# Patient Record
Sex: Female | Born: 1975
Health system: Southern US, Community
[De-identification: ages and names within clinical notes are randomized; demographics above are authoritative.]

## PROBLEM LIST (undated history)

## (undated) DIAGNOSIS — M1990 Unspecified osteoarthritis, unspecified site: Secondary | ICD-10-CM

## (undated) DIAGNOSIS — T884XXA Failed or difficult intubation, initial encounter: Secondary | ICD-10-CM

## (undated) DIAGNOSIS — I10 Essential (primary) hypertension: Secondary | ICD-10-CM

## (undated) DIAGNOSIS — M797 Fibromyalgia: Secondary | ICD-10-CM

## (undated) DIAGNOSIS — J189 Pneumonia, unspecified organism: Secondary | ICD-10-CM

## (undated) DIAGNOSIS — F419 Anxiety disorder, unspecified: Secondary | ICD-10-CM

## (undated) DIAGNOSIS — L409 Psoriasis, unspecified: Secondary | ICD-10-CM

## (undated) DIAGNOSIS — K219 Gastro-esophageal reflux disease without esophagitis: Secondary | ICD-10-CM

## (undated) DIAGNOSIS — F32A Depression, unspecified: Secondary | ICD-10-CM

## (undated) DIAGNOSIS — A6920 Lyme disease, unspecified: Secondary | ICD-10-CM

## (undated) DIAGNOSIS — D649 Anemia, unspecified: Secondary | ICD-10-CM

## (undated) DIAGNOSIS — Z87442 Personal history of urinary calculi: Secondary | ICD-10-CM

## (undated) HISTORY — DX: Psoriasis, unspecified: L40.9

## (undated) HISTORY — DX: Gastro-esophageal reflux disease without esophagitis: K21.9

## (undated) HISTORY — PX: APPENDECTOMY: SHX54

## (undated) HISTORY — DX: Fibromyalgia: M79.7

## (undated) HISTORY — PX: COLONOSCOPY: SHX174

## (undated) HISTORY — PX: CHOLECYSTECTOMY: SHX55

## (undated) HISTORY — DX: Lyme disease, unspecified: A69.20

## (undated) HISTORY — PX: ABDOMINAL HYSTERECTOMY: SHX81

---

## 1999-11-02 ENCOUNTER — Encounter: Payer: Self-pay | Admitting: Emergency Medicine

## 1999-11-02 ENCOUNTER — Emergency Department (HOSPITAL_COMMUNITY): Admission: EM | Admit: 1999-11-02 | Discharge: 1999-11-02 | Payer: Self-pay | Admitting: Emergency Medicine

## 2003-09-26 ENCOUNTER — Inpatient Hospital Stay (HOSPITAL_COMMUNITY): Admission: RE | Admit: 2003-09-26 | Discharge: 2003-09-29 | Payer: Self-pay | Admitting: Obstetrics & Gynecology

## 2011-03-28 ENCOUNTER — Other Ambulatory Visit (HOSPITAL_COMMUNITY): Payer: Self-pay | Admitting: Family Medicine

## 2011-03-28 DIAGNOSIS — N63 Unspecified lump in unspecified breast: Secondary | ICD-10-CM

## 2011-04-13 ENCOUNTER — Encounter (HOSPITAL_COMMUNITY): Payer: Self-pay

## 2011-04-27 ENCOUNTER — Ambulatory Visit (HOSPITAL_COMMUNITY)
Admission: RE | Admit: 2011-04-27 | Discharge: 2011-04-27 | Disposition: A | Discharge status: Home / Self Care | Payer: BC Managed Care – PPO | Source: Ambulatory Visit | Attending: Family Medicine | Admitting: Family Medicine

## 2011-04-27 ENCOUNTER — Other Ambulatory Visit (HOSPITAL_COMMUNITY): Payer: Self-pay | Admitting: Family Medicine

## 2011-04-27 DIAGNOSIS — N63 Unspecified lump in unspecified breast: Secondary | ICD-10-CM

## 2011-04-27 DIAGNOSIS — Z803 Family history of malignant neoplasm of breast: Secondary | ICD-10-CM | POA: Insufficient documentation

## 2012-12-26 ENCOUNTER — Telehealth: Payer: Self-pay | Admitting: Obstetrics & Gynecology

## 2012-12-26 NOTE — Telephone Encounter (Signed)
Pt aware message has been sent to Dr. Despina Hidden for hormone patch, Dr. Despina Hidden out of the office this am due to surgery. Pt informed to call office back on Friday if RX not sent to pharmacy or pt has not heard back from our office. Pt verbalized understanding.

## 2012-12-26 NOTE — Telephone Encounter (Signed)
Spoke with pt. Needs refill on hormone med. Wants to try the hormone patch. Can you send to [facility] in [place]?

## 2013-01-01 MED ORDER — ESTRADIOL 0.52 MG/0.87 GM (0.06%) TD GEL: 1.0000 "application " | Freq: Every day | TRANSDERMAL | Status: DC

## 2013-01-01 NOTE — Telephone Encounter (Signed)
Pt aware that med had been e prescribed and instructions on use. JSY

## 2013-01-01 NOTE — Telephone Encounter (Signed)
Elestrin 1 squirt per day, increase to 2 a day if needed

## 2013-11-25 ENCOUNTER — Ambulatory Visit (INDEPENDENT_AMBULATORY_CARE_PROVIDER_SITE_OTHER): Payer: BC Managed Care – PPO | Admitting: Obstetrics & Gynecology

## 2013-11-25 ENCOUNTER — Encounter: Payer: Self-pay | Admitting: Obstetrics & Gynecology

## 2013-11-25 VITALS — BP 110/80 | Ht 62.0 in | Wt 108.0 lb

## 2013-11-25 DIAGNOSIS — Z01419 Encounter for gynecological examination (general) (routine) without abnormal findings: Secondary | ICD-10-CM

## 2013-11-25 DIAGNOSIS — N952 Postmenopausal atrophic vaginitis: Secondary | ICD-10-CM

## 2013-11-25 MED ORDER — ESTRADIOL 0.52 MG/0.87 GM (0.06%) TD GEL: 1.0000 "application " | Freq: Every day | TRANSDERMAL | Status: DC

## 2013-11-25 MED ORDER — ESTROGENS, CONJUGATED 0.625 MG/GM VA CREA: 1.0000 | TOPICAL_CREAM | Freq: Every day | VAGINAL | Status: DC

## 2013-11-25 NOTE — Addendum Note (Signed)
Addended by: Florian Buff on: 11/25/2013 09:37 AM   Modules accepted: Orders

## 2013-11-25 NOTE — Progress Notes (Signed)
Patient ID: Helen Rice, female   DOB: [birth year], 38 y.o.   MRN: [id number] Subjective:     Helen Rice is a 38 y.o. female here for a routine exam.  No LMP recorded. Patient has had a hysterectomy. No obstetric history on file. Birth Control Method:  na Menstrual Calendar(currently): na  Current complaints: vaginal dryness.   Current acute medical issues:  none   Recent Gynecologic History No LMP recorded. Patient has had a hysterectomy. Last Pap: na,   Last mammogram: na,    History reviewed. No pertinent past medical history.  Past Surgical History  Procedure Laterality Date  . Abdominal hysterectomy      OB History   Grav Para Term Preterm Abortions TAB SAB Ect Mult Living                  History   Social History  . Marital Status: Married    Spouse Name: N/A    Number of Children: N/A  . Years of Education: N/A   Social History Main Topics  . Smoking status: Current Every Day Smoker  . Smokeless tobacco: None  . Alcohol Use: None  . Drug Use: None  . Sexual Activity: Yes   Other Topics Concern  . None   Social History Narrative  . None    History reviewed. No pertinent family history.   Review of Systems  Review of Systems  Constitutional: Negative for fever, chills, weight loss, malaise/fatigue and diaphoresis.  HENT: Negative for hearing loss, ear pain, nosebleeds, congestion, sore throat, neck pain, tinnitus and ear discharge.   Eyes: Negative for blurred vision, double vision, photophobia, pain, discharge and redness.  Respiratory: Negative for cough, hemoptysis, sputum production, shortness of breath, wheezing and stridor.   Cardiovascular: Negative for chest pain, palpitations, orthopnea, claudication, leg swelling and PND.  Gastrointestinal: negative for abdominal pain. Negative for heartburn, nausea, vomiting, diarrhea, constipation, blood in stool and melena.  Genitourinary: Negative for dysuria, urgency, frequency, hematuria and flank  pain.  Musculoskeletal: Negative for myalgias, back pain, joint pain and falls.  Skin: Negative for itching and rash.  Neurological: Negative for dizziness, tingling, tremors, sensory change, speech change, focal weakness, seizures, loss of consciousness, weakness and headaches.  Endo/Heme/Allergies: Negative for environmental allergies and polydipsia. Does not bruise/bleed easily.  Psychiatric/Behavioral: Negative for depression, suicidal ideas, hallucinations, memory loss and substance abuse. The patient is not nervous/anxious and does not have insomnia.        Objective:    Physical Exam  Vitals reviewed. Constitutional: She is oriented to person, place, and time. She appears well-developed and well-nourished.  HENT:  Head: Normocephalic and atraumatic.        Right Ear: External ear normal.  Left Ear: External ear normal.  Nose: Nose normal.  Mouth/Throat: Oropharynx is clear and moist.  Eyes: Conjunctivae and EOM are normal. Pupils are equal, round, and reactive to light. Right eye exhibits no discharge. Left eye exhibits no discharge. No scleral icterus.  Neck: Normal range of motion. Neck supple. No tracheal deviation present. No thyromegaly present.  Cardiovascular: Normal rate, regular rhythm, normal heart sounds and intact distal pulses.  Exam reveals no gallop and no friction rub.   No murmur heard. Respiratory: Effort normal and breath sounds normal. No respiratory distress. She has no wheezes. She has no rales. She exhibits no tenderness.  GI: Soft. Bowel sounds are normal. She exhibits no distension and no mass. There is no tenderness. There is no rebound and  no guarding.  Genitourinary:  Breasts no masses skin changes or nipple changes bilaterally      Vulva is normal without lesions Vagina is atrophic loss of ruggae Cervix absent Uterus is normal size shape and contour Adnexa is negative ovaries are absent   Musculoskeletal: Normal range of motion. She exhibits no  edema and no tenderness.  Neurological: She is alert and oriented to person, place, and time. She has normal reflexes. She displays normal reflexes. No cranial nerve deficit. She exhibits normal muscle tone. Coordination normal.  Skin: Skin is warm and dry. No rash noted. No erythema. No pallor.  Psychiatric: She has a normal mood and affect. Her behavior is normal. Judgment and thought content normal.       Assessment:    Healthy female exam.    Plan:    Hormone replacement therapy: hormone replacement therapy: elestrin 1 pump per day\. Mammogram ordered. Follow up in: 1 year. add premarin vaginal cream every other night   Mammogram next year

## 2014-02-24 ENCOUNTER — Ambulatory Visit: Payer: BC Managed Care – PPO | Admitting: Obstetrics & Gynecology

## 2014-10-15 ENCOUNTER — Other Ambulatory Visit: Payer: Self-pay | Admitting: Obstetrics & Gynecology

## 2014-10-15 ENCOUNTER — Other Ambulatory Visit: Payer: Self-pay

## 2014-10-15 DIAGNOSIS — N63 Unspecified lump in unspecified breast: Secondary | ICD-10-CM

## 2014-10-16 ENCOUNTER — Other Ambulatory Visit: Payer: Self-pay | Admitting: Obstetrics & Gynecology

## 2014-10-16 DIAGNOSIS — Z1231 Encounter for screening mammogram for malignant neoplasm of breast: Secondary | ICD-10-CM

## 2014-10-22 ENCOUNTER — Ambulatory Visit (HOSPITAL_COMMUNITY)
Admission: RE | Admit: 2014-10-22 | Discharge: 2014-10-22 | Disposition: A | Discharge status: Home / Self Care | Payer: 59 | Source: Ambulatory Visit | Attending: Obstetrics & Gynecology | Admitting: Obstetrics & Gynecology

## 2014-10-22 DIAGNOSIS — Z1231 Encounter for screening mammogram for malignant neoplasm of breast: Secondary | ICD-10-CM | POA: Insufficient documentation

## 2015-04-03 ENCOUNTER — Telehealth: Payer: Self-pay | Admitting: Obstetrics & Gynecology

## 2015-04-03 NOTE — Telephone Encounter (Signed)
Discussed situation with patient She made an appt to be seen on Tuesday

## 2015-04-07 ENCOUNTER — Encounter: Payer: Self-pay | Admitting: Obstetrics & Gynecology

## 2015-04-07 ENCOUNTER — Ambulatory Visit (INDEPENDENT_AMBULATORY_CARE_PROVIDER_SITE_OTHER): Payer: 59 | Admitting: Obstetrics & Gynecology

## 2015-04-07 VITALS — BP 120/80 | HR 88 | Wt 99.0 lb

## 2015-04-07 DIAGNOSIS — Z118 Encounter for screening for other infectious and parasitic diseases: Secondary | ICD-10-CM | POA: Diagnosis not present

## 2015-04-07 DIAGNOSIS — N952 Postmenopausal atrophic vaginitis: Secondary | ICD-10-CM

## 2015-04-07 DIAGNOSIS — Z1159 Encounter for screening for other viral diseases: Secondary | ICD-10-CM | POA: Diagnosis not present

## 2015-04-07 DIAGNOSIS — N3941 Urge incontinence: Secondary | ICD-10-CM | POA: Diagnosis not present

## 2015-04-07 MED ORDER — ESTROGENS, CONJUGATED 0.625 MG/GM VA CREA: 1.0000 | TOPICAL_CREAM | Freq: Every day | VAGINAL | Status: DC

## 2015-04-07 MED ORDER — SOLIFENACIN SUCCINATE 10 MG PO TABS: 10.0000 mg | ORAL_TABLET | Freq: Every day | ORAL | Status: DC

## 2015-04-07 NOTE — Progress Notes (Signed)
Patient ID: Helen Rice, female   DOB: [birth year], 39 y.o.   MRN: [id number] Chief Complaint  Patient presents with  . gyn visit    problem holding urine. check std screening.vaginal discharge.    Blood pressure 120/80, pulse 88, weight 99 lb (44.906 kg).  39 y.o. No obstetric history on file. No LMP recorded. Patient has had a hysterectomy. The current method of family planning is status post hysterectomy.  Subjective Pt has a concern about possible exposure to STD and has had a small bump a while ago No malodorous discharge Still with discomfort with intercourse, has not been using her premarin vaginal cream at all Also with increasing problems over the [past 2-3 months with urine loss right after voiding  Objective Vulva:  normal appearing vulva with no masses, tenderness or lesions, atrophic Vagina:  normal mucosa, no discharge, atrophic Cervix:  absent Uterus:  uterus absent Adnexa: ovaries:not present,      Pertinent ROS No burning with urination, frequency or urgency No nausea, vomiting or diarrhea Nor fever chills or other constitutional symptoms   Labs or studies     Impression Diagnoses this Encounter::   ICD-9-CM ICD-10-CM   1. Urge incontinence 788.31 N39.41 solifenacin (VESICARE) 10 MG tablet  2. Atrophic vaginitis 627.3 N95.2     Established relevant diagnosis(es):   Plan/Recommendations Meds ordered this encounter  Medications  . conjugated estrogens (PREMARIN) vaginal cream    Sig: Place 1 Applicatorful vaginally daily.    Dispense:  42.5 g    Refill:  12  . solifenacin (VESICARE) 10 MG tablet    Sig: Take 1 tablet (10 mg total) by mouth daily.    Dispense:  30 tablet    Refill:  11   Studies ordered: No orders of the defined types were placed in this encounter.      Follow up Return in about 3 months (around 07/08/2015) for Follow up, with Dr Elonda Husky.   All questions were answered.

## 2015-04-07 NOTE — Addendum Note (Signed)
Addended by: Diona Fanti A on: 04/07/2015 12:10 PM   Modules accepted: Orders

## 2015-04-09 LAB — GC/CHLAMYDIA PROBE AMP
CHLAMYDIA, DNA PROBE: NEGATIVE
NEISSERIA GONORRHOEAE BY PCR: NEGATIVE

## 2015-04-10 ENCOUNTER — Telehealth: Payer: Self-pay | Admitting: Obstetrics & Gynecology

## 2015-04-10 MED ORDER — ESTRADIOL 2 MG PO TABS: 2.0000 mg | ORAL_TABLET | Freq: Every day | ORAL | Status: DC

## 2015-04-10 MED ORDER — FESOTERODINE FUMARATE ER 8 MG PO TB24: ORAL_TABLET | ORAL | Status: DC

## 2015-04-10 NOTE — Telephone Encounter (Signed)
Pt states her insurance will not cover Premarin or the [place], [facility] sent fax stating pt insurance will cover estradiol or estropipate, Cenestin or Enjuvia as alternative for Premarin and would cover Oxybutynin or Toviaz as alternative for the Vesicare. Pt also states needs refill RX for the Estradiol Gel sent to [facility]. Pt requesting these RX to be done today due to Monday being a Holiday and pharmacy would be closed.

## 2015-07-07 ENCOUNTER — Encounter: Payer: Self-pay | Admitting: Obstetrics & Gynecology

## 2015-07-07 ENCOUNTER — Ambulatory Visit: Payer: 59 | Admitting: Obstetrics & Gynecology

## 2015-10-29 ENCOUNTER — Other Ambulatory Visit (HOSPITAL_COMMUNITY): Payer: Self-pay | Admitting: Family Medicine

## 2015-10-29 ENCOUNTER — Other Ambulatory Visit (HOSPITAL_COMMUNITY): Payer: Self-pay

## 2015-10-29 DIAGNOSIS — N63 Unspecified lump in unspecified breast: Secondary | ICD-10-CM

## 2015-10-30 ENCOUNTER — Other Ambulatory Visit (HOSPITAL_COMMUNITY): Payer: Self-pay | Admitting: Family Medicine

## 2015-10-30 DIAGNOSIS — N63 Unspecified lump in unspecified breast: Secondary | ICD-10-CM

## 2015-11-02 ENCOUNTER — Other Ambulatory Visit (HOSPITAL_COMMUNITY): Payer: Self-pay | Admitting: Family Medicine

## 2015-11-02 DIAGNOSIS — N63 Unspecified lump in unspecified breast: Secondary | ICD-10-CM

## 2015-11-03 ENCOUNTER — Ambulatory Visit (HOSPITAL_COMMUNITY)
Admission: RE | Admit: 2015-11-03 | Discharge: 2015-11-03 | Disposition: A | Discharge status: Home / Self Care | Payer: 59 | Source: Ambulatory Visit | Attending: Family Medicine | Admitting: Family Medicine

## 2015-11-03 DIAGNOSIS — N63 Unspecified lump in unspecified breast: Secondary | ICD-10-CM

## 2015-11-04 ENCOUNTER — Encounter (HOSPITAL_COMMUNITY): Payer: 59

## 2015-12-24 ENCOUNTER — Other Ambulatory Visit: Payer: Self-pay | Admitting: Obstetrics & Gynecology

## 2015-12-24 ENCOUNTER — Telehealth: Payer: Self-pay | Admitting: *Deleted

## 2015-12-30 ENCOUNTER — Telehealth: Payer: Self-pay | Admitting: Obstetrics & Gynecology

## 2015-12-30 ENCOUNTER — Telehealth: Payer: Self-pay | Admitting: *Deleted

## 2015-12-30 MED ORDER — ESTROGENS, CONJUGATED 0.625 MG/GM VA CREA: 1.0000 | TOPICAL_CREAM | Freq: Every day | VAGINAL | Status: DC

## 2015-12-30 MED ORDER — ESTRADIOL 2 MG PO TABS: 2.0000 mg | ORAL_TABLET | Freq: Every day | ORAL | Status: DC

## 2015-12-30 NOTE — Telephone Encounter (Signed)
Pt called back and states she needed RX for Estradiol tablet and Premarin vaginal Cream.

## 2015-12-31 NOTE — Telephone Encounter (Signed)
RX done. 

## 2016-02-18 ENCOUNTER — Ambulatory Visit (INDEPENDENT_AMBULATORY_CARE_PROVIDER_SITE_OTHER): Payer: 59 | Admitting: Otolaryngology

## 2016-02-18 DIAGNOSIS — K219 Gastro-esophageal reflux disease without esophagitis: Secondary | ICD-10-CM | POA: Diagnosis not present

## 2016-02-18 DIAGNOSIS — R490 Dysphonia: Secondary | ICD-10-CM

## 2016-02-22 ENCOUNTER — Other Ambulatory Visit (INDEPENDENT_AMBULATORY_CARE_PROVIDER_SITE_OTHER): Payer: Self-pay | Admitting: Otolaryngology

## 2016-02-22 DIAGNOSIS — R1310 Dysphagia, unspecified: Secondary | ICD-10-CM

## 2016-02-26 ENCOUNTER — Ambulatory Visit (HOSPITAL_COMMUNITY): Admission: RE | Admit: 2016-02-26 | Payer: 59 | Source: Ambulatory Visit

## 2016-03-04 ENCOUNTER — Ambulatory Visit (HOSPITAL_COMMUNITY): Payer: 59

## 2016-03-11 ENCOUNTER — Ambulatory Visit (HOSPITAL_COMMUNITY)
Admission: RE | Admit: 2016-03-11 | Discharge: 2016-03-11 | Disposition: A | Discharge status: Home / Self Care | Payer: 59 | Source: Ambulatory Visit | Attending: Otolaryngology | Admitting: Otolaryngology

## 2016-03-11 DIAGNOSIS — R1310 Dysphagia, unspecified: Secondary | ICD-10-CM | POA: Diagnosis present

## 2016-10-20 ENCOUNTER — Other Ambulatory Visit: Payer: Self-pay | Admitting: Obstetrics & Gynecology

## 2016-10-20 DIAGNOSIS — Z1231 Encounter for screening mammogram for malignant neoplasm of breast: Secondary | ICD-10-CM

## 2016-11-07 ENCOUNTER — Ambulatory Visit (HOSPITAL_COMMUNITY)
Admission: RE | Admit: 2016-11-07 | Discharge: 2016-11-07 | Disposition: A | Discharge status: Home / Self Care | Payer: 59 | Source: Ambulatory Visit | Attending: Obstetrics & Gynecology | Admitting: Obstetrics & Gynecology

## 2016-11-07 ENCOUNTER — Encounter: Payer: Self-pay | Admitting: Obstetrics & Gynecology

## 2016-11-07 ENCOUNTER — Ambulatory Visit (INDEPENDENT_AMBULATORY_CARE_PROVIDER_SITE_OTHER): Payer: 59 | Admitting: Obstetrics & Gynecology

## 2016-11-07 VITALS — BP 102/70 | HR 72 | Ht 62.5 in | Wt 118.0 lb

## 2016-11-07 DIAGNOSIS — Z1231 Encounter for screening mammogram for malignant neoplasm of breast: Secondary | ICD-10-CM | POA: Diagnosis present

## 2016-11-07 DIAGNOSIS — Z01419 Encounter for gynecological examination (general) (routine) without abnormal findings: Secondary | ICD-10-CM | POA: Diagnosis not present

## 2016-11-07 MED ORDER — ESTROGENS, CONJUGATED 0.625 MG/GM VA CREA: 1.0000 | TOPICAL_CREAM | Freq: Every day | VAGINAL | 12 refills | Status: DC

## 2016-11-07 MED ORDER — ESTRADIOL 2 MG PO TABS: 2.0000 mg | ORAL_TABLET | Freq: Every day | ORAL | 11 refills | Status: DC

## 2016-11-07 NOTE — Progress Notes (Signed)
Subjective:     Helen Rice is a 41 y.o. female here for a routine exam.  No LMP recorded. Patient has had a hysterectomy. No obstetric history on file. Birth Control Method:  hysterectomy Menstrual Calendar(currently): n/a  Current complaints: fibromyalgia.   Current acute medical issues:  fibromyalgia   Recent Gynecologic History No LMP recorded. Patient has had a hysterectomy. Last Pap: ,   Last mammogram: 10/2015  normal  Past Medical History:  Diagnosis Date  . Fibromyalgia   . GERD (gastroesophageal reflux disease)     Past Surgical History:  Procedure Laterality Date  . ABDOMINAL HYSTERECTOMY      OB History    No data available      Social History   Social History  . Marital status: Married    Spouse name: N/A  . Number of children: N/A  . Years of education: N/A   Social History Main Topics  . Smoking status: Current Every Day Smoker  . Smokeless tobacco: Not on file  . Alcohol use Not on file  . Drug use: Unknown  . Sexual activity: Yes   Other Topics Concern  . Not on file   Social History Narrative  . No narrative on file    No family history on file.   Current Outpatient Prescriptions:  .  conjugated estrogens (PREMARIN) vaginal cream, Place 1 Applicatorful vaginally daily., Disp: 42.5 g, Rfl: 12 .  DULoxetine (CYMBALTA) 60 MG capsule, Take 60 mg by mouth daily., Disp: , Rfl:  .  estradiol (ESTRACE) 2 MG tablet, Take 1 tablet (2 mg total) by mouth daily., Disp: 30 tablet, Rfl: 11 .  pantoprazole (PROTONIX) 40 MG tablet, Take 40 mg by mouth 2 (two) times daily., Disp: , Rfl:  .  solifenacin (VESICARE) 10 MG tablet, Take 1 tablet (10 mg total) by mouth daily. (Patient not taking: Reported on 11/07/2016), Disp: 30 tablet, Rfl: 11  Review of Systems  Review of Systems  Constitutional: Negative for fever, chills, weight loss, malaise/fatigue and diaphoresis.  HENT: Negative for hearing loss, ear pain, nosebleeds, congestion, sore throat, neck  pain, tinnitus and ear discharge.   Eyes: Negative for blurred vision, double vision, photophobia, pain, discharge and redness.  Respiratory: Negative for cough, hemoptysis, sputum production, shortness of breath, wheezing and stridor.   Cardiovascular: Negative for chest pain, palpitations, orthopnea, claudication, leg swelling and PND.  Gastrointestinal: negative for abdominal pain. Negative for heartburn, nausea, vomiting, diarrhea, constipation, blood in stool and melena.  Genitourinary: Negative for dysuria, urgency, frequency, hematuria and flank pain.  Musculoskeletal: Negative for myalgias, back pain, joint pain and falls.  Skin: Negative for itching and rash.  Neurological: Negative for dizziness, tingling, tremors, sensory change, speech change, focal weakness, seizures, loss of consciousness, weakness and headaches.  Endo/Heme/Allergies: Negative for environmental allergies and polydipsia. Does not bruise/bleed easily.  Psychiatric/Behavioral: Negative for depression, suicidal ideas, hallucinations, memory loss and substance abuse. The patient is not nervous/anxious and does not have insomnia.        Objective:  Blood pressure 102/70, pulse 72, height 5' 2.5" (1.588 m), weight 118 lb (53.5 kg).   Physical Exam  Vitals reviewed. Constitutional: She is oriented to person, place, and time. She appears well-developed and well-nourished.  HENT:  Head: Normocephalic and atraumatic.        Right Ear: External ear normal.  Left Ear: External ear normal.  Nose: Nose normal.  Mouth/Throat: Oropharynx is clear and moist.  Eyes: Conjunctivae and EOM are normal. Pupils  are equal, round, and reactive to light. Right eye exhibits no discharge. Left eye exhibits no discharge. No scleral icterus.  Neck: Normal range of motion. Neck supple. No tracheal deviation present. No thyromegaly present.  Cardiovascular: Normal rate, regular rhythm, normal heart sounds and intact distal pulses.  Exam  reveals no gallop and no friction rub.   No murmur heard. Respiratory: Effort normal and breath sounds normal. No respiratory distress. She has no wheezes. She has no rales. She exhibits no tenderness.  GI: Soft. Bowel sounds are normal. She exhibits no distension and no mass. There is no tenderness. There is no rebound and no guarding.  Genitourinary:  Breasts no masses skin changes or nipple changes bilaterally      Vulva is normal without lesions Vagina is pink moist without discharge Cervix absent Uterus is absent Adnexa is negative with normal sized ovaries   Musculoskeletal: Normal range of motion. She exhibits no edema and no tenderness.  Neurological: She is alert and oriented to person, place, and time. She has normal reflexes. She displays normal reflexes. No cranial nerve deficit. She exhibits normal muscle tone. Coordination normal.  Skin: Skin is warm and dry. No rash noted. No erythema. No pallor.  Psychiatric: She has a normal mood and affect. Her behavior is normal. Judgment and thought content normal.       Medications Ordered at today's visit: Meds ordered this encounter  Medications  . pantoprazole (PROTONIX) 40 MG tablet    Sig: Take 40 mg by mouth 2 (two) times daily.  . DULoxetine (CYMBALTA) 60 MG capsule    Sig: Take 60 mg by mouth daily.  Marland Kitchen estradiol (ESTRACE) 2 MG tablet    Sig: Take 1 tablet (2 mg total) by mouth daily.    Dispense:  30 tablet    Refill:  11  . conjugated estrogens (PREMARIN) vaginal cream    Sig: Place 1 Applicatorful vaginally daily.    Dispense:  42.5 g    Refill:  12    Other orders placed at today's visit: No orders of the defined types were placed in this encounter.     Assessment:    Healthy female exam.    Plan:    Hormone replacement therapy: hormone replacement therapy: oral estradiol 2mg . Mammogram ordered. Follow up in: 1 year.     No Follow-up on file.

## 2017-03-10 DIAGNOSIS — K219 Gastro-esophageal reflux disease without esophagitis: Secondary | ICD-10-CM | POA: Diagnosis not present

## 2017-03-10 DIAGNOSIS — L304 Erythema intertrigo: Secondary | ICD-10-CM | POA: Diagnosis not present

## 2017-03-10 DIAGNOSIS — Z1389 Encounter for screening for other disorder: Secondary | ICD-10-CM | POA: Diagnosis not present

## 2017-03-10 DIAGNOSIS — R1313 Dysphagia, pharyngeal phase: Secondary | ICD-10-CM | POA: Diagnosis not present

## 2017-03-23 IMAGING — RF DG ESOPHAGUS
5 of 7 series · 12 of 17 positions shown · non-contrast
Comparison: None.

CLINICAL DATA: Difficulty swallowing. Sensation of food sticking in
throat. Failed intubation for elective surgery in [place].

EXAM:
ESOPHOGRAM / BARIUM SWALLOW / BARIUM TABLET STUDY
TECHNIQUE: Combined double contrast and single contrast examination performed
using effervescent crystals, thick barium liquid, and thin barium
liquid. The patient was observed with fluoroscopy swallowing a 13 mm
barium sulphate tablet.
FLUOROSCOPY TIME:  Radiation Exposure Index (as provided by the
fluoroscopic device): 13.1 mGy

[Series 1: fluoro_barium 2fps_bw · 0.18mm/px · 3 of 12 frames shown (1 of 3)]
[frame 2/12]
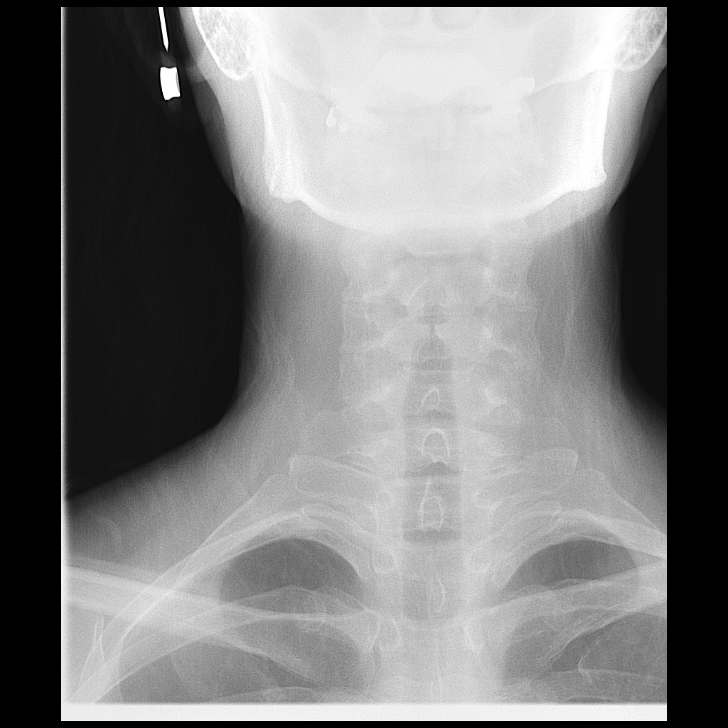
[frame 7/12]
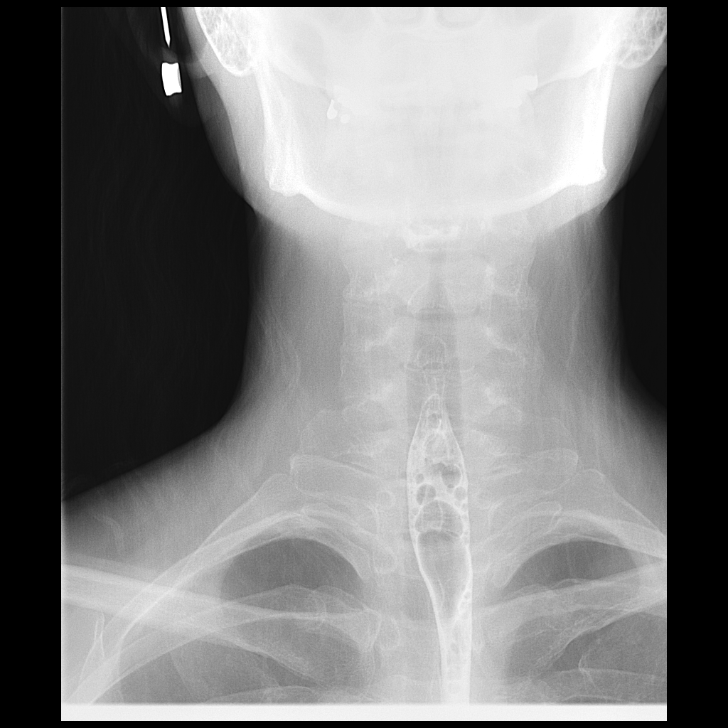
[frame 11/12]
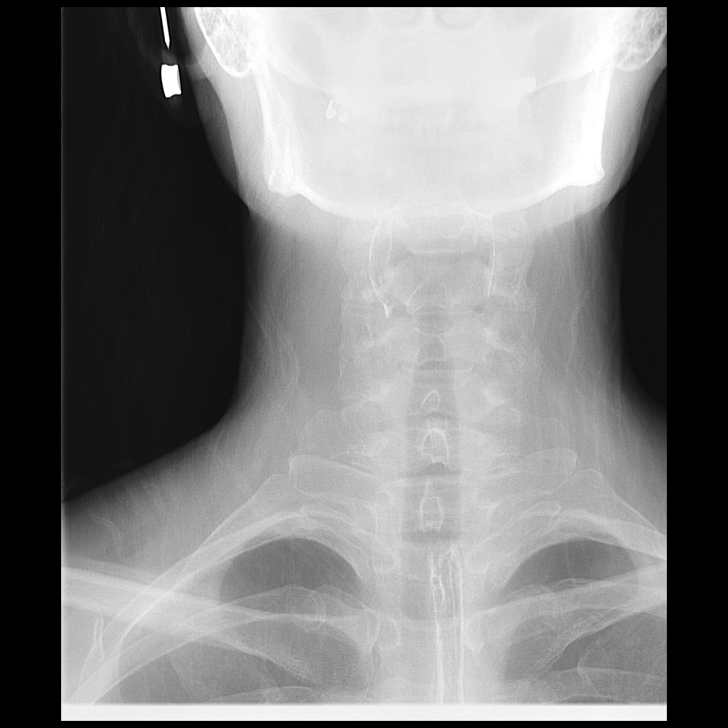

[Series 2: fluoro_barium 2fps_bw · 0.19mm/px · 3 of 12 frames shown (2 of 3)]
[frame 2/12]
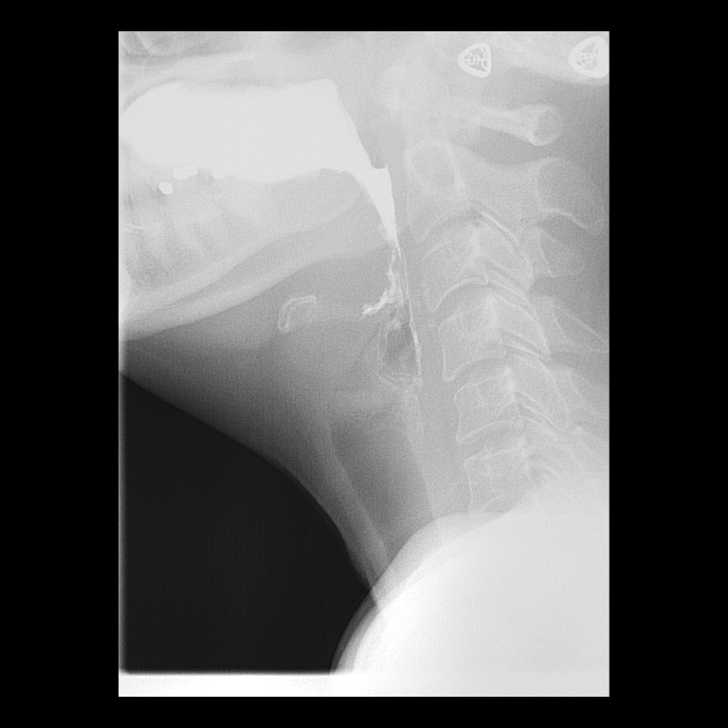
[frame 7/12]
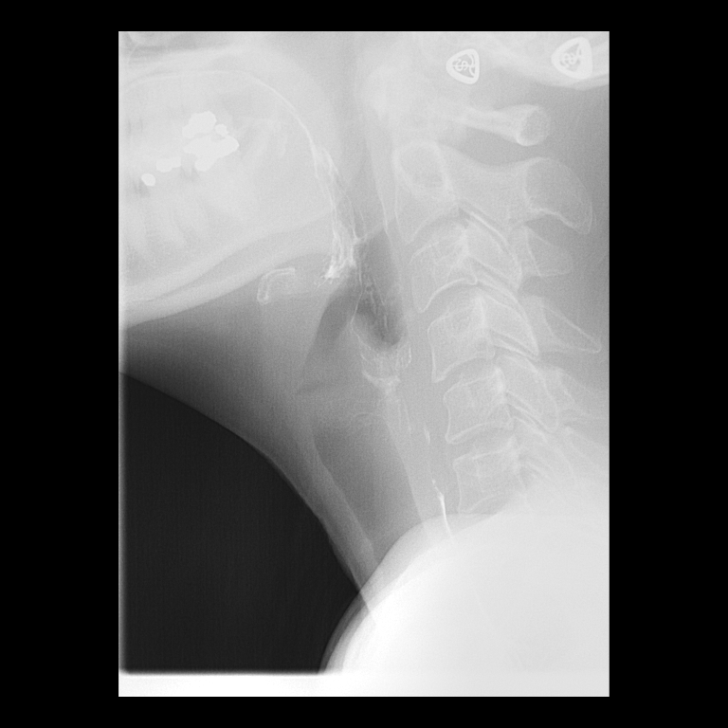
[frame 11/12]
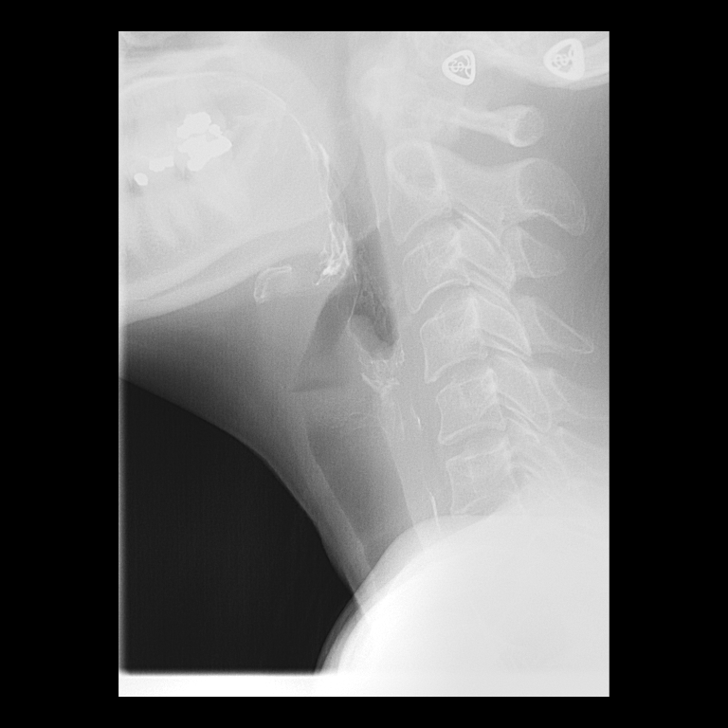

[Series 4: cp_standard · 0.37mm/px · 3 of 78 frames shown (1 of 2)]
[frame 12/78]
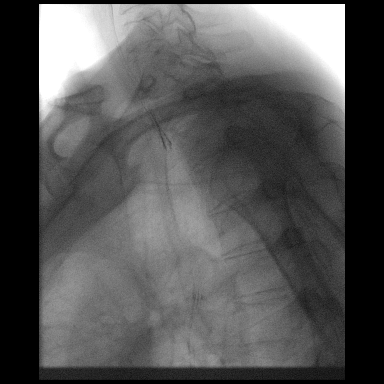
[frame 40/78]
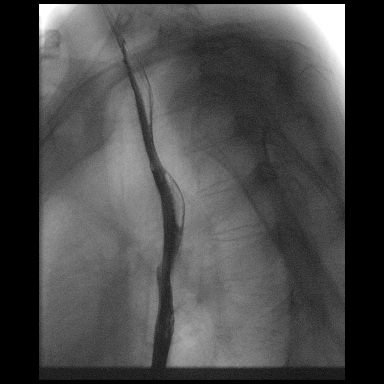
[frame 75/78]
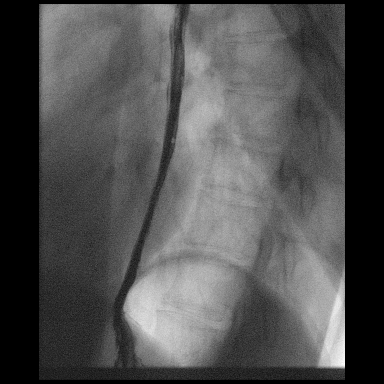

[Series 5: fluoro_barium 2fps_bw · 0.19mm/px · 2 of 2 frames shown (3 of 3)]
[frame 1/2]
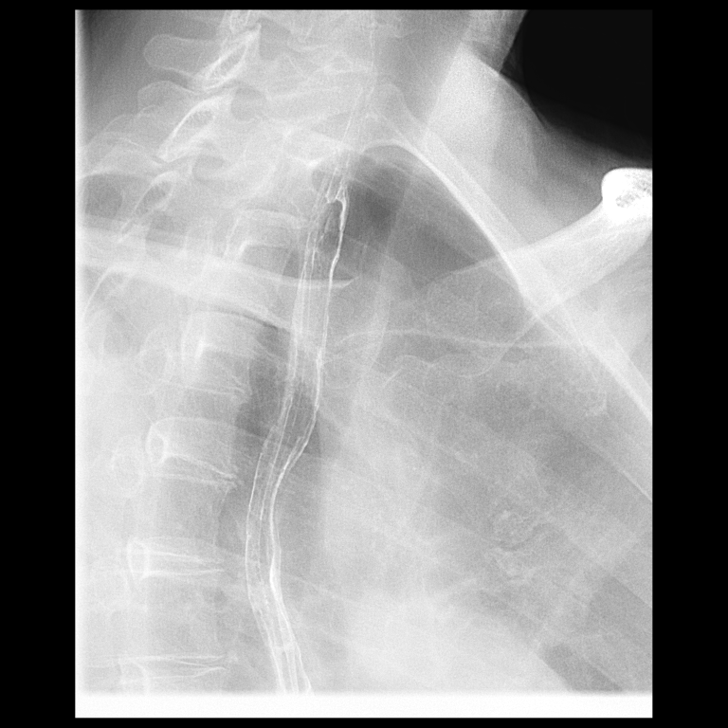
[frame 2/2]
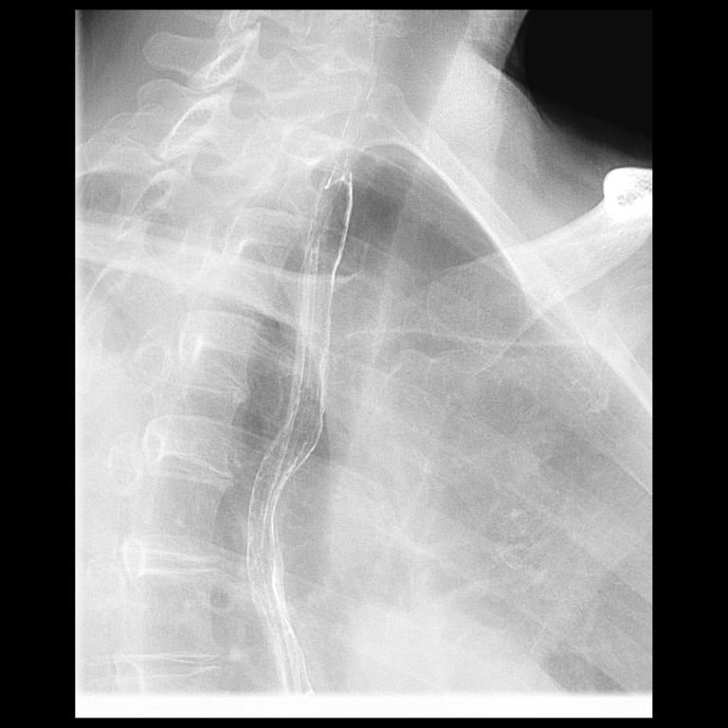

[Series 7: cp_standard · 0.19mm/px · 1 of 1 slices shown (2 of 2)]
[im 1/1]
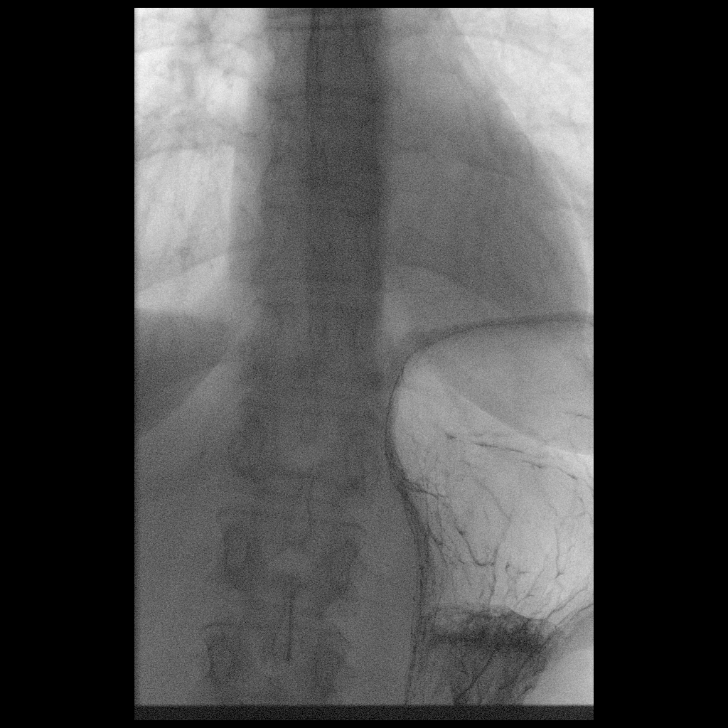

[12 of 17 positions shown; findings below may reference images not displayed]

FINDINGS: There is no swallowing dysfunction in the high cervical esophagus
assessed by rapid sequence imaging. No stricture, web, or mass.

No mucosal irregularity within the thoracic esophagus or distal
esophagus. GE junction is normal. No hiatal hernia.

Normal esophageal motility.  No gastroesophageal reflux.

13 mm barium tab passed the GE junction easily.
IMPRESSION: Normal esophagram.

## 2017-04-21 DIAGNOSIS — K635 Polyp of colon: Secondary | ICD-10-CM | POA: Diagnosis not present

## 2017-04-21 DIAGNOSIS — D127 Benign neoplasm of rectosigmoid junction: Secondary | ICD-10-CM | POA: Diagnosis not present

## 2017-04-21 DIAGNOSIS — K2970 Gastritis, unspecified, without bleeding: Secondary | ICD-10-CM | POA: Diagnosis not present

## 2017-04-21 DIAGNOSIS — K922 Gastrointestinal hemorrhage, unspecified: Secondary | ICD-10-CM | POA: Diagnosis not present

## 2017-05-01 DIAGNOSIS — D127 Benign neoplasm of rectosigmoid junction: Secondary | ICD-10-CM | POA: Diagnosis not present

## 2017-05-01 DIAGNOSIS — G44219 Episodic tension-type headache, not intractable: Secondary | ICD-10-CM | POA: Diagnosis not present

## 2017-05-01 DIAGNOSIS — K921 Melena: Secondary | ICD-10-CM | POA: Diagnosis not present

## 2017-05-01 DIAGNOSIS — H40033 Anatomical narrow angle, bilateral: Secondary | ICD-10-CM | POA: Diagnosis not present

## 2017-05-04 ENCOUNTER — Encounter (INDEPENDENT_AMBULATORY_CARE_PROVIDER_SITE_OTHER): Payer: Self-pay | Admitting: Internal Medicine

## 2017-05-06 DIAGNOSIS — H04123 Dry eye syndrome of bilateral lacrimal glands: Secondary | ICD-10-CM | POA: Diagnosis not present

## 2017-05-15 DIAGNOSIS — G44219 Episodic tension-type headache, not intractable: Secondary | ICD-10-CM | POA: Diagnosis not present

## 2017-05-30 ENCOUNTER — Encounter (INDEPENDENT_AMBULATORY_CARE_PROVIDER_SITE_OTHER): Payer: Self-pay | Admitting: Internal Medicine

## 2017-05-30 ENCOUNTER — Ambulatory Visit (INDEPENDENT_AMBULATORY_CARE_PROVIDER_SITE_OTHER): Payer: 59 | Admitting: Internal Medicine

## 2017-05-30 VITALS — BP 134/76 | HR 80 | Temp 97.9°F | Ht 62.0 in | Wt 133.5 lb

## 2017-05-30 DIAGNOSIS — K219 Gastro-esophageal reflux disease without esophagitis: Secondary | ICD-10-CM | POA: Diagnosis not present

## 2017-05-30 DIAGNOSIS — K625 Hemorrhage of anus and rectum: Secondary | ICD-10-CM

## 2017-05-30 HISTORY — DX: Gastro-esophageal reflux disease without esophagitis: K21.9

## 2017-05-30 LAB — CBC WITH DIFFERENTIAL/PLATELET
BASOS ABS: 22 {cells}/uL (ref 0–200)
Basophils Relative: 0.4 %
EOS PCT: 1.3 %
Eosinophils Absolute: 73 cells/uL (ref 15–500)
HEMATOCRIT: 35.4 % (ref 35.0–45.0)
HEMOGLOBIN: 12.4 g/dL (ref 11.7–15.5)
Lymphs Abs: 2050 cells/uL (ref 850–3900)
MCH: 31 pg (ref 27.0–33.0)
MCHC: 35 g/dL (ref 32.0–36.0)
MCV: 88.5 fL (ref 80.0–100.0)
MPV: 9.2 fL (ref 7.5–12.5)
Monocytes Relative: 7.9 %
NEUTROS PCT: 53.8 %
Neutro Abs: 3013 cells/uL (ref 1500–7800)
Platelets: 294 10*3/uL (ref 140–400)
RBC: 4 10*6/uL (ref 3.80–5.10)
RDW: 11.9 % (ref 11.0–15.0)
TOTAL LYMPHOCYTE: 36.6 %
WBC mixed population: 442 cells/uL (ref 200–950)
WBC: 5.6 10*3/uL (ref 3.8–10.8)

## 2017-05-30 NOTE — Patient Instructions (Signed)
3 stools cards send home with patient CBC today.

## 2017-05-30 NOTE — Progress Notes (Signed)
   Subjective:    Patient ID: Helen Rice, female    DOB: 26-Jul-1976, 41 y.o.   MRN: [id number] PCP Dr. Quillian Quince HPI Referred by Dr. Quillian Quince for blood in stool. Underwent and EGD and colonoscopy last month for blood in her stool. She says she still has blood in her stool. Her stools are brown but she does have some black in her stools. She has seen some bright red blood in her stools. BRRB about a week ago.  Takes Tylenol as needed. She stopped NSAIDs. She take BC powders years ago. She alternates between constipation and diarrhea.  Does not take iron.   04/21/2017 EGD/ Colonosocpy:" Heme positive stool.    No ulcers or stigmata of bleeding noted on EGD or Colonoscopy. Hyperplastic polyps x 1 ( B at 40cm) . Tubular adenomas x 3 (rectosigmoid).  The results of the specimens revealed multiple benign adenomatous polyps.  Completer resection was performed. Recommend repeat colonoscopy in 3 yrs.  Referred for possible endoscopy.   Review of Systems Past Medical History:  Diagnosis Date  . Fibromyalgia   . GERD (gastroesophageal reflux disease)   . GERD (gastroesophageal reflux disease) 05/30/2017    Past Surgical History:  Procedure Laterality Date  . ABDOMINAL HYSTERECTOMY      Allergies  Allergen Reactions  . Latex     rash  . Sulfur Rash    Current Outpatient Prescriptions on File Prior to Visit  Medication Sig Dispense Refill  . conjugated estrogens (PREMARIN) vaginal cream Place 1 Applicatorful vaginally daily. 42.5 g 12  . DULoxetine (CYMBALTA) 60 MG capsule Take 60 mg by mouth daily.    Marland Kitchen estradiol (ESTRACE) 2 MG tablet Take 1 tablet (2 mg total) by mouth daily. 30 tablet 11  . pantoprazole (PROTONIX) 40 MG tablet Take 40 mg by mouth 2 (two) times daily.     No current facility-administered medications on file prior to visit.         Objective:   Physical Exam Blood pressure 134/76, pulse 80, temperature 97.9 F (36.6 C), height 5\' 2"  (1.575 m), weight 133 lb 8 oz  (60.6 kg). Alert and oriented. Skin warm and dry. Oral mucosa is moist.   . Sclera anicteric, conjunctivae is pink. Thyroid not enlarged. No cervical lymphadenopathy. Lungs clear. Heart regular rate and rhythm.  Abdomen is soft. Bowel sounds are positive. No hepatomegaly. No abdominal masses felt. No tenderness.  No edema to lower extremities.   Stool brown and guaiac negative.         Assessment & Plan:  Melena.  Stool brown and guaiac negative.  Am going to send 3 stool cards home with patient. CBC today

## 2017-06-13 ENCOUNTER — Telehealth (INDEPENDENT_AMBULATORY_CARE_PROVIDER_SITE_OTHER): Payer: Self-pay | Admitting: *Deleted

## 2017-06-13 NOTE — Telephone Encounter (Signed)
   Diagnosis:    Result(s)   Card 1: Negative:     Card 2: Positive   Card 3: Negative:    Completed by: Marquesa Rath,LPN   HEMOCCULT SENSA DEVELOPER: LOT#:  [id number] EXPIRATION DATE: 2020-05   HEMOCCULT SENSA CARD:  LOT#:  [id number] EXPIRATION DATE: 05/20   CARD CONTROL RESULTS:  POSITIVE: Positive NEGATIVE: Negative    ADDITIONAL COMMENTS: Forwarded to Lelon Perla

## 2017-06-14 NOTE — Telephone Encounter (Signed)
No answer. Will call tomorrow. 

## 2017-06-15 ENCOUNTER — Other Ambulatory Visit (INDEPENDENT_AMBULATORY_CARE_PROVIDER_SITE_OTHER): Payer: Self-pay | Admitting: Internal Medicine

## 2017-06-15 DIAGNOSIS — R195 Other fecal abnormalities: Secondary | ICD-10-CM

## 2017-06-15 NOTE — Telephone Encounter (Signed)
Results given to patient. I discussed with Dr. Laural Golden Given Capsule.

## 2017-06-15 NOTE — Telephone Encounter (Signed)
GIVENS sch'd 06/21/17, patient aware, verbal instructions given to patient

## 2017-06-21 ENCOUNTER — Ambulatory Visit (HOSPITAL_COMMUNITY)
Admission: RE | Admit: 2017-06-21 | Discharge: 2017-06-21 | Disposition: A | Discharge status: Home / Self Care | Payer: 59 | Source: Ambulatory Visit | Attending: Internal Medicine | Admitting: Internal Medicine

## 2017-06-21 ENCOUNTER — Encounter (HOSPITAL_COMMUNITY)
Admission: RE | Disposition: A | Discharge status: Home / Self Care | Payer: Self-pay | Source: Ambulatory Visit | Attending: Internal Medicine

## 2017-06-21 DIAGNOSIS — Z79890 Hormone replacement therapy (postmenopausal): Secondary | ICD-10-CM | POA: Insufficient documentation

## 2017-06-21 DIAGNOSIS — Z882 Allergy status to sulfonamides status: Secondary | ICD-10-CM | POA: Diagnosis not present

## 2017-06-21 DIAGNOSIS — Z79899 Other long term (current) drug therapy: Secondary | ICD-10-CM | POA: Insufficient documentation

## 2017-06-21 DIAGNOSIS — K219 Gastro-esophageal reflux disease without esophagitis: Secondary | ICD-10-CM | POA: Insufficient documentation

## 2017-06-21 DIAGNOSIS — Q2733 Arteriovenous malformation of digestive system vessel: Secondary | ICD-10-CM | POA: Diagnosis not present

## 2017-06-21 DIAGNOSIS — K922 Gastrointestinal hemorrhage, unspecified: Secondary | ICD-10-CM | POA: Diagnosis present

## 2017-06-21 DIAGNOSIS — Z91040 Latex allergy status: Secondary | ICD-10-CM | POA: Insufficient documentation

## 2017-06-21 DIAGNOSIS — R195 Other fecal abnormalities: Secondary | ICD-10-CM

## 2017-06-21 DIAGNOSIS — M797 Fibromyalgia: Secondary | ICD-10-CM | POA: Insufficient documentation

## 2017-06-21 HISTORY — PX: GIVENS CAPSULE STUDY: SHX5432

## 2017-06-21 SURGERY — IMAGING PROCEDURE, GI TRACT, INTRALUMINAL, VIA CAPSULE

## 2017-06-21 NOTE — H&P (Signed)
Helen Rice is an 41 y.o. female.   Chief Complaint: Patient is here for small bowel given capsule study. HPI: Patient is 41 year old Caucasian female who has a history of rectal bleeding melena.  She was evaluated by Dr. Gar Ponto.  She underwent EGD and colonoscopy in September at [facility].  She had few polyps removed from her colon but no bleeding lesion was identified.  She does not take NSAIDs.  Patient states few months back she lost over 15 pounds in a matter of few months and workup was negative but she has gained all of this weight back.  Past Medical History:  Diagnosis Date  . Fibromyalgia   . GERD (gastroesophageal reflux disease)   .  05/30/2017    Past Surgical History:  Procedure Laterality Date  . ABDOMINAL HYSTERECTOMY      No family history on file. Social History:  reports that she has been smoking.  she has never used smokeless tobacco. She reports that she does not drink alcohol or use drugs.  Allergies:  Allergies  Allergen Reactions  . Latex     rash  . Sulfur Rash    Medications Prior to Admission  Medication Sig Dispense Refill  . conjugated estrogens (PREMARIN) vaginal cream Place 1 Applicatorful vaginally daily. 42.5 g 12  . DULoxetine (CYMBALTA) 60 MG capsule Take 60 mg by mouth daily.    Marland Kitchen estradiol (ESTRACE) 2 MG tablet Take 1 tablet (2 mg total) by mouth daily. 30 tablet 11  . pantoprazole (PROTONIX) 40 MG tablet Take 40 mg by mouth 2 (two) times daily.      No results found for this or any previous visit (from the past 48 hour(s)). No results found.  ROS  There were no vitals taken for this visit. Physical Exam   Assessment/Plan GI bleed of occult origin. Small bowel given capsule study.  Hildred Laser, MD 06/21/2017, 8:45 AM

## 2017-06-23 ENCOUNTER — Encounter (HOSPITAL_COMMUNITY): Payer: Self-pay | Admitting: Internal Medicine

## 2017-06-25 DIAGNOSIS — K921 Melena: Secondary | ICD-10-CM | POA: Diagnosis not present

## 2017-06-25 DIAGNOSIS — K31819 Angiodysplasia of stomach and duodenum without bleeding: Secondary | ICD-10-CM

## 2017-06-25 DIAGNOSIS — Z9889 Other specified postprocedural states: Secondary | ICD-10-CM | POA: Diagnosis not present

## 2017-06-25 NOTE — Op Note (Signed)
Small Bowel Givens Capsule Study Procedure date: 06/21/2017  Referring Provider: Gar Ponto, MD PCP:  Dr. Caryl Bis, MD  Indication for procedure:    Patient is 41 year old Caucasian female with recurrence of melena.  She underwent EGD and colonoscopy and no abnormality was found to account for her GI bleed.  She did have 4 small polyps removed and 3 were adenomas.  She remains with heme positive stool.  No history of recent NSAID use.  She is undergoing small bowel study to complete her GI workup..  Findings:   Patient swallowed given capsule without any difficulty. Small bowel series completed as capsule reached within study period. Study duration 7 hours 46 minutes and 39 seconds. Petechiae noted at gastric antrum first and the second part of the duodenum without active bleeding. Single small AV malformation noted at 03:21:17.   First Gastric image: 57 sec First Duodenal image: 8 min and 45 sec First Ileo-Cecal Valve image: 4 hr and 22 sec First Cecal image: 4 hr 30 min and 27 sec Gastric Passage time: 7 min and 48 sec Small Bowel Passage time: 4 hr and 23 min  Summary & Recommendations:  Gastric antral and duodenal petechiae without active bleeding. Single small AV malformation at distal small bowel without stigmata of bleed. CBC and Hemoccult in 1 month. If patient has another episode of frank melena will consider EGD.

## 2017-07-20 ENCOUNTER — Telehealth (INDEPENDENT_AMBULATORY_CARE_PROVIDER_SITE_OTHER): Payer: Self-pay | Admitting: *Deleted

## 2017-07-20 NOTE — Telephone Encounter (Signed)
   Diagnosis:    Result(s)   Card 1: Negative:     Card 2: Negative:   Card 3:Negative:    Completed by: Thomas Hoff , LPN   HEMOCCULT SENSA DEVELOPER: LOT#:  [id number] EXPIRATION DATE: 2020-10   HEMOCCULT SENSA CARD:  LOT#:  [id number] EXPIRATION DATE: 03/20   CARD CONTROL RESULTS:  POSITIVE: PositiveNEGATIVE: Negative    ADDITIONAL COMMENTS: Patient was called with the results. Forwarded to [place] for review.

## 2017-07-23 NOTE — Telephone Encounter (Signed)
All Hemoccults are negative. Patient will call if she has melena or rectal bleeding. CBC in 3 months.

## 2017-07-24 ENCOUNTER — Other Ambulatory Visit (INDEPENDENT_AMBULATORY_CARE_PROVIDER_SITE_OTHER): Payer: Self-pay | Admitting: *Deleted

## 2017-07-24 DIAGNOSIS — K921 Melena: Secondary | ICD-10-CM

## 2017-07-24 NOTE — Telephone Encounter (Signed)
CBC has been noted and a letter wiil be sent to the patient as a reminder.

## 2017-08-14 DIAGNOSIS — L304 Erythema intertrigo: Secondary | ICD-10-CM | POA: Diagnosis not present

## 2017-08-14 DIAGNOSIS — H6123 Impacted cerumen, bilateral: Secondary | ICD-10-CM | POA: Diagnosis not present

## 2017-08-14 DIAGNOSIS — R1313 Dysphagia, pharyngeal phase: Secondary | ICD-10-CM | POA: Diagnosis not present

## 2017-08-14 DIAGNOSIS — K219 Gastro-esophageal reflux disease without esophagitis: Secondary | ICD-10-CM | POA: Diagnosis not present

## 2017-08-31 DIAGNOSIS — R5383 Other fatigue: Secondary | ICD-10-CM | POA: Diagnosis not present

## 2017-08-31 DIAGNOSIS — K219 Gastro-esophageal reflux disease without esophagitis: Secondary | ICD-10-CM | POA: Diagnosis not present

## 2017-08-31 DIAGNOSIS — N951 Menopausal and female climacteric states: Secondary | ICD-10-CM | POA: Diagnosis not present

## 2017-10-05 ENCOUNTER — Encounter (INDEPENDENT_AMBULATORY_CARE_PROVIDER_SITE_OTHER): Payer: Self-pay | Admitting: *Deleted

## 2017-10-05 ENCOUNTER — Other Ambulatory Visit (INDEPENDENT_AMBULATORY_CARE_PROVIDER_SITE_OTHER): Payer: Self-pay | Admitting: *Deleted

## 2017-10-05 DIAGNOSIS — K921 Melena: Secondary | ICD-10-CM

## 2018-05-21 ENCOUNTER — Encounter: Payer: Self-pay | Admitting: Family Medicine

## 2018-05-21 DIAGNOSIS — K824 Cholesterolosis of gallbladder: Secondary | ICD-10-CM | POA: Insufficient documentation

## 2018-05-21 DIAGNOSIS — K811 Chronic cholecystitis: Secondary | ICD-10-CM | POA: Insufficient documentation

## 2018-05-23 ENCOUNTER — Encounter: Payer: Self-pay | Admitting: Family Medicine

## 2018-05-23 ENCOUNTER — Ambulatory Visit (INDEPENDENT_AMBULATORY_CARE_PROVIDER_SITE_OTHER): Payer: 59 | Admitting: Family Medicine

## 2018-05-23 VITALS — BP 131/75 | HR 73 | Temp 97.5°F | Ht 62.0 in | Wt 133.0 lb

## 2018-05-23 DIAGNOSIS — H938X2 Other specified disorders of left ear: Secondary | ICD-10-CM

## 2018-05-23 DIAGNOSIS — Z8349 Family history of other endocrine, nutritional and metabolic diseases: Secondary | ICD-10-CM

## 2018-05-23 DIAGNOSIS — A6920 Lyme disease, unspecified: Secondary | ICD-10-CM

## 2018-05-23 DIAGNOSIS — M79604 Pain in right leg: Secondary | ICD-10-CM

## 2018-05-23 DIAGNOSIS — Z7689 Persons encountering health services in other specified circumstances: Secondary | ICD-10-CM | POA: Diagnosis not present

## 2018-05-23 DIAGNOSIS — R600 Localized edema: Secondary | ICD-10-CM

## 2018-05-23 DIAGNOSIS — M79605 Pain in left leg: Secondary | ICD-10-CM

## 2018-05-23 NOTE — Patient Instructions (Addendum)
You had labs performed today.  You will be contacted with the results of the labs once they are available, usually in the next 3 business days for routine lab work.   Have the integrated doctor send me copies of your visits.  If you need labs obtained here, please have them send me the orders and I will place them.  Let's hold of on autoimmune work up until you are cleared by the doctor at [facility].  I do not want any of the Lyme stuff to interfere with your lab results.  Start taking an antihistamine daily.  This should help your ear.  If you develop fever, chills, or foul smelling discharge from the ear, contact me.

## 2018-05-23 NOTE — Progress Notes (Signed)
Subjective: AQ:TMAUQJFHL care, LE edema HPI: Helen Rice is a 42 y.o. female presenting to clinic today for:  1. LE edema/chronic Lyme disease Patient reports chronic bilateral lower extremity edema.  She notes that she is currently under treatment for Lyme disease but is unsure as to whether or not this is related.  She is had swelling in bilateral hands as well.  She saw her eye doctor last year and was told that she should be evaluated for lupus or multiple sclerosis and when she went to her previous PCP this was not further evaluated.  She denies having had testing for thyroid, metabolic issues that possibly may be contributing.  She has not been worked up for autoimmune disease that may be contributing to swelling and bilateral leg pain.  She is currently under the care of [facility] for chronic Lyme disease and is undergoing Rocephin treatment for the next month.  She has a PICC line in place in the right upper extremity.  She just started treatment yesterday.  Medical history is also significant for psoriasis, which she has not had a flare of in many years.  Family history significant for autoimmune disease of unknown nature in her father as well as psoriasis.  Her maternal aunt has history of thyroid disease.  2.  Left ear concern Patient reports that she had a " wet ear" on the left for a couple of weeks now.  She intermittently uses an antihistamine prior to her treatments for Lyme disease.  Nothing used daily.  Denies any fevers, overt ear pain or foul-smelling discharge.  She does report that the left ear tends to be the more waxy of the 2.  Past Medical History:  Diagnosis Date  . Fibromyalgia   . GERD (gastroesophageal reflux disease) 05/30/2017  . Lyme disease    being seen by [place]  . Psoriasis    Past Surgical History:  Procedure Laterality Date  . ABDOMINAL HYSTERECTOMY    . APPENDECTOMY    . CESAREAN SECTION    . GIVENS CAPSULE STUDY  N/A 06/21/2017   Procedure: GIVENS CAPSULE STUDY;  Surgeon: Rogene Houston, MD;  Location[facility];  Service: Endoscopy;  Laterality: N/A;   Social History   Socioeconomic History  . Marital status: Married    Spouse name: Not on file  . Number of children: Not on file  . Years of education: Not on file  . Highest education level: Not on file  Occupational History  . Not on file  Social Needs  . Financial resource strain: Not on file  . Food insecurity:    Worry: Not on file    Inability: Not on file  . Transportation needs:    Medical: Not on file    Non-medical: Not on file  Tobacco Use  . Smoking status: Former Smoker    Packs/day: 1.50    Years: 15.00    Pack years: 22.50    Last attempt to quit: 2016    Years since quitting: 3.7  . Smokeless tobacco: Never Used  Substance and Sexual Activity  . Alcohol use: Yes    Comment: occ  . Drug use: No  . Sexual activity: Yes  Lifestyle  . Physical activity:    Days per week: Not on file    Minutes per session: Not on file  . Stress: Not on file  Relationships  . Social connections:    Talks on phone: Not on file  Gets together: Not on file    Attends religious service: Not on file    Active member of club or organization: Not on file    Attends meetings of clubs or organizations: Not on file    Relationship status: Not on file  . Intimate partner violence:    Fear of current or ex partner: Not on file    Emotionally abused: Not on file    Physically abused: Not on file    Forced sexual activity: Not on file  Other Topics Concern  . Not on file  Social History Narrative  . Not on file   Current Meds  Medication Sig  . conjugated estrogens (PREMARIN) vaginal cream Place 1 Applicatorful vaginally daily.  Marland Kitchen estradiol (ESTRACE) 2 MG tablet Take 2 mg by mouth daily.   Family History  Problem Relation Age of Onset  . Breast cancer Sister   . Cancer Father   . Psoriasis Father   . Autoimmune disease  Father   . Ovarian cancer Paternal Grandmother 34  . Thyroid disease Maternal Aunt   . Heart disease Maternal Grandmother   . Heart disease Maternal Grandfather   . Ovarian cancer Paternal Grandfather    Allergies  Allergen Reactions  . Latex     rash  . Sulfur Rash     Health Maintenance: has allergy to flu shot   ROS: Per HPI  Objective: Office vital signs reviewed. BP 131/75   Pulse 73   Temp (!) 97.5 F (36.4 C) (Oral)   Ht '5\' 2"'  (1.575 m)   Wt 133 lb (60.3 kg)   BMI 24.33 kg/m   Physical Examination:  General: Awake, alert, well nourished, well appearing female. No acute distress HEENT: Normal, sclera white, MMM, left ear with mild flaking and dry wax within the auditory canal.  No appreciable evidence of infection. Cardio: regular rate and rhythm, S1S2 heard, no murmurs appreciated Pulm: clear to auscultation bilaterally, no wheezes, rhonchi or rales; normal work of breathing on room air Extremities: Right upper extremity with PICC line in place. MSK: Normal gait and station Skin: dry; intact; no rashes or lesions  Assessment/ Plan: 42 y.o. female who presents to the office today to establish care.  She notes that she will actually be keeping her old PCP as well wanted to have someone else available should she need them.  She is also being followed by [facility] for treatment of Lyme disease.  She has had an adverse reaction to intubation 3 years ago when they tried to resect her gallbladder.  She did previously had no problems with sedation and has tolerated other surgeries without difficulty.  At this time, there are no plans to repeat the surgery.  1. Lyme disease Currently under the care of [facility].  I did asked that patient have them send me copies of the office visits and semi-correspondence with orders for any labs that need to be obtained over here.  Currently undergoing treatment with Rocephin for the next 4 weeks.  2.  Establishing care with new doctor, encounter for  3. Bilateral lower extremity edema Of uncertain etiology.  Will obtain basic metabolic labs, including thyroid panel and B12 level.  Leg pain may be related to the edema.  However, will rule out underlying metabolic issue.  We discussed that we could consider obtaining autoimmune labs after she is completed treatment for Lyme disease and has been cleared by her specialist.  She has history of psoriasis which certainly  puts her at increased risk for other autoimmune diseases. - CMP14+EGFR - CBC - Vitamin B12 - Magnesium - Thyroid Panel With TSH  4. Family history of thyroid disease - Thyroid Panel With TSH  5. Leg pain, bilateral - Vitamin B12 - Magnesium  6. Irritation of ear, left No evidence of infection.  Recommended daily oral antihistamine.  Follow-up PRN.   [name], DO [place] [phone]

## 2018-05-24 LAB — CMP14+EGFR
ALBUMIN: 5 g/dL (ref 3.5–5.5)
ALT: 15 IU/L (ref 0–32)
AST: 20 IU/L (ref 0–40)
Albumin/Globulin Ratio: 2.3 — ABNORMAL HIGH (ref 1.2–2.2)
Alkaline Phosphatase: 109 IU/L (ref 39–117)
BUN / CREAT RATIO: 17 (ref 9–23)
BUN: 13 mg/dL (ref 6–24)
CHLORIDE: 100 mmol/L (ref 96–106)
CO2: 24 mmol/L (ref 20–29)
Calcium: 9.9 mg/dL (ref 8.7–10.2)
Creatinine, Ser: 0.77 mg/dL (ref 0.57–1.00)
GFR calc non Af Amer: 95 mL/min/{1.73_m2} (ref >59)
GFR, EST AFRICAN AMERICAN: 109 mL/min/{1.73_m2} (ref >59)
GLUCOSE: 81 mg/dL (ref 65–99)
Globulin, Total: 2.2 g/dL (ref 1.5–4.5)
Potassium: 4 mmol/L (ref 3.5–5.2)
Sodium: 142 mmol/L (ref 134–144)
TOTAL PROTEIN: 7.2 g/dL (ref 6.0–8.5)

## 2018-05-24 LAB — VITAMIN B12: Vitamin B-12: 873 pg/mL (ref 232–1245)

## 2018-05-24 LAB — MAGNESIUM: MAGNESIUM: 2.1 mg/dL (ref 1.6–2.3)

## 2018-05-24 LAB — CBC
HEMATOCRIT: 40 % (ref 34.0–46.6)
Hemoglobin: 13.7 g/dL (ref 11.1–15.9)
MCH: 30.9 pg (ref 26.6–33.0)
MCHC: 34.3 g/dL (ref 31.5–35.7)
MCV: 90 fL (ref 79–97)
Platelets: 317 10*3/uL (ref 150–450)
RBC: 4.43 x10E6/uL (ref 3.77–5.28)
RDW: 12.4 % (ref 12.3–15.4)
WBC: 5.8 10*3/uL (ref 3.4–10.8)

## 2018-05-24 LAB — THYROID PANEL WITH TSH
FREE THYROXINE INDEX: 1.7 (ref 1.2–4.9)
T3 Uptake Ratio: 23 % — ABNORMAL LOW (ref 24–39)
T4, Total: 7.4 ug/dL (ref 4.5–12.0)
TSH: 1.79 u[IU]/mL (ref 0.450–4.500)

## 2018-06-07 ENCOUNTER — Encounter: Payer: Self-pay | Admitting: Family Medicine

## 2018-06-07 NOTE — Telephone Encounter (Signed)
Please let pt. Know Dr. Lajuana Ripple is away, but will call when she returns next week.  WS

## 2018-06-18 ENCOUNTER — Encounter (INDEPENDENT_AMBULATORY_CARE_PROVIDER_SITE_OTHER): Payer: Self-pay | Admitting: Internal Medicine

## 2018-06-18 ENCOUNTER — Encounter (INDEPENDENT_AMBULATORY_CARE_PROVIDER_SITE_OTHER): Payer: Self-pay | Admitting: *Deleted

## 2018-06-18 ENCOUNTER — Ambulatory Visit (INDEPENDENT_AMBULATORY_CARE_PROVIDER_SITE_OTHER): Payer: 59 | Admitting: Internal Medicine

## 2018-06-18 VITALS — BP 130/80 | HR 80 | Temp 98.0°F | Ht 62.0 in | Wt 132.1 lb

## 2018-06-18 DIAGNOSIS — R1010 Upper abdominal pain, unspecified: Secondary | ICD-10-CM

## 2018-06-18 DIAGNOSIS — R1031 Right lower quadrant pain: Secondary | ICD-10-CM

## 2018-06-18 NOTE — Progress Notes (Signed)
Subjective:    Patient ID: Helen Rice, female    DOB: [birth year], 42 y.o.   MRN: [id number]  HPI Presents today with c/o abdominal pain.  Seen back in October of 2018 for rectal bleeding. Underwent an EGD and Colonoscopy in 2018 (see below). Given capsule in October of 2018 revealed Gastric antral and duodenal petechiae without active bleeding. Single small AV malformation at distal small bowel without stigmata of bleed. She tell me 2 weeks she was seeing her Dr and she had tenderness in her abdomen on her rt side.  She says when someone pushes on her abdomen, she has pain.  She says she has always has some tenderness in this area.  Her appetite is okay. No weight loss. No nausea or vomiting.  Has a BM x 1 a day.    Hepatic Function Latest Ref Rng & Units 05/23/2018  Total Protein 6.0 - 8.5 g/dL 7.2  Albumin 3.5 - 5.5 g/dL 5.0  AST 0 - 40 IU/L 20  ALT 0 - 32 IU/L 15  Alk Phosphatase 39 - 117 IU/L 109  Total Bilirubin 0.0 - 1.2 mg/dL <0.2     04/21/2017 EGD/ Colonosocpy:" Heme positive stool.    No ulcers or stigmata of bleeding noted on EGD or Colonoscopy. Hyperplastic polyps x 1 ( B at 40cm) . Tubular adenomas x 3 (rectosigmoid).  The results of the specimens revealed multiple benign adenomatous polyps.  Completer resection was performed. Recommend repeat colonoscopy in 3 yrs.  Referred for possible endoscopy.     Taking Rocephin 2 gm IV x 2 a day for Chronic Lyme's disease.   CBC    Component Value Date/Time   WBC 5.8 05/23/2018 1600   WBC 5.6 05/30/2017 1454   RBC 4.43 05/23/2018 1600   RBC 4.00 05/30/2017 1454   HGB 13.7 05/23/2018 1600   HCT 40.0 05/23/2018 1600   PLT 317 05/23/2018 1600   MCV 90 05/23/2018 1600   MCH 30.9 05/23/2018 1600   MCH 31.0 05/30/2017 1454   MCHC 34.3 05/23/2018 1600   MCHC 35.0 05/30/2017 1454   RDW 12.4 05/23/2018 1600   LYMPHSABS 2,050 05/30/2017 1454   EOSABS 73 05/30/2017 1454   BASOSABS 22 05/30/2017 1454     Review of  Systems Past Medical History:  Diagnosis Date  . Fibromyalgia   . GERD (gastroesophageal reflux disease) 05/30/2017  . Lyme disease    being seen by [place]  . Psoriasis     Past Surgical History:  Procedure Laterality Date  . ABDOMINAL HYSTERECTOMY    . APPENDECTOMY    . CESAREAN SECTION    . GIVENS CAPSULE STUDY N/A 06/21/2017   Procedure: GIVENS CAPSULE STUDY;  Surgeon: Rogene Houston, MD;  Location: [facility];  Service: Endoscopy;  Laterality: N/A;    Allergies  Allergen Reactions  . Influenza Vaccines     Cannot take  . Latex     rash  . Sulfur Rash    Current Outpatient Medications on File Prior to Visit  Medication Sig Dispense Refill  . conjugated estrogens (PREMARIN) vaginal cream Place 1 Applicatorful vaginally daily. 42.5 g 12  . estradiol (ESTRACE) 2 MG tablet Take 2 mg by mouth daily.     No current facility-administered medications on file prior to visit.         Objective:   Physical Exam Blood pressure 130/80, pulse 80, temperature 98 F (36.7 C), height '5\' 2"'  (1.575 m), weight 132 lb  1.6 oz (59.9 kg). Alert and oriented. Skin warm and dry. Oral mucosa is moist.   . Sclera anicteric, conjunctivae is pink. Thyroid not enlarged. No cervical lymphadenopathy. Lungs clear. Heart regular rate and rhythm.  Abdomen is soft. Bowel .sounds are positive. No hepatomegaly. No abdominal masses felt. Tenderness rt mid abdomen and LUQ  No edema to lower extremities.          Assessment & Plan:  Rt abdominal pain. LUQ painHas had this for years. Am going to get an US abdomen., amylase and lipase.  Further recommendations to follow.

## 2018-06-18 NOTE — Patient Instructions (Signed)
Labs and US abdomen.  

## 2018-06-19 LAB — LIPASE: LIPASE: 30 U/L (ref 7–60)

## 2018-06-19 LAB — AMYLASE: Amylase: 59 U/L (ref 21–101)

## 2018-06-20 ENCOUNTER — Other Ambulatory Visit: Payer: Self-pay | Admitting: Obstetrics & Gynecology

## 2018-06-20 DIAGNOSIS — Z1231 Encounter for screening mammogram for malignant neoplasm of breast: Secondary | ICD-10-CM

## 2018-06-22 ENCOUNTER — Ambulatory Visit (HOSPITAL_COMMUNITY)
Admission: RE | Admit: 2018-06-22 | Discharge: 2018-06-22 | Disposition: A | Discharge status: Home / Self Care | Payer: 59 | Source: Ambulatory Visit | Attending: Internal Medicine | Admitting: Internal Medicine

## 2018-06-22 ENCOUNTER — Ambulatory Visit (HOSPITAL_COMMUNITY)
Admission: RE | Admit: 2018-06-22 | Discharge: 2018-06-22 | Disposition: A | Discharge status: Home / Self Care | Payer: 59 | Source: Ambulatory Visit | Attending: Obstetrics & Gynecology | Admitting: Obstetrics & Gynecology

## 2018-06-22 DIAGNOSIS — Z1231 Encounter for screening mammogram for malignant neoplasm of breast: Secondary | ICD-10-CM

## 2018-06-22 DIAGNOSIS — K8020 Calculus of gallbladder without cholecystitis without obstruction: Secondary | ICD-10-CM | POA: Insufficient documentation

## 2018-06-22 DIAGNOSIS — R1010 Upper abdominal pain, unspecified: Secondary | ICD-10-CM | POA: Insufficient documentation

## 2018-06-22 DIAGNOSIS — R109 Unspecified abdominal pain: Secondary | ICD-10-CM | POA: Diagnosis present

## 2018-06-22 DIAGNOSIS — K760 Fatty (change of) liver, not elsewhere classified: Secondary | ICD-10-CM | POA: Insufficient documentation

## 2018-06-25 ENCOUNTER — Telehealth (INDEPENDENT_AMBULATORY_CARE_PROVIDER_SITE_OTHER): Payer: Self-pay | Admitting: Internal Medicine

## 2018-06-25 DIAGNOSIS — K8010 Calculus of gallbladder with chronic cholecystitis without obstruction: Secondary | ICD-10-CM

## 2018-06-25 NOTE — Telephone Encounter (Signed)
Referral to Dr. Ladona Horns

## 2018-06-25 NOTE — Telephone Encounter (Signed)
err

## 2018-06-25 NOTE — Telephone Encounter (Signed)
errr

## 2018-06-26 ENCOUNTER — Encounter (INDEPENDENT_AMBULATORY_CARE_PROVIDER_SITE_OTHER): Payer: Self-pay | Admitting: *Deleted

## 2018-06-26 NOTE — Telephone Encounter (Signed)
This encounter was created in error - please disregard.

## 2018-06-26 NOTE — Telephone Encounter (Signed)
appt with Dr Ladona Horns 07/04/18 at [phone] ([phone]), patient aware

## 2018-06-26 NOTE — Telephone Encounter (Signed)
Patient needs suprep 

## 2018-06-29 ENCOUNTER — Encounter: Payer: Self-pay | Admitting: Family Medicine

## 2018-07-02 ENCOUNTER — Ambulatory Visit (INDEPENDENT_AMBULATORY_CARE_PROVIDER_SITE_OTHER): Payer: 59 | Admitting: Family Medicine

## 2018-07-02 ENCOUNTER — Encounter: Payer: Self-pay | Admitting: Family Medicine

## 2018-07-02 VITALS — BP 130/82 | HR 80 | Temp 98.1°F | Ht 62.0 in | Wt 131.0 lb

## 2018-07-02 DIAGNOSIS — Z23 Encounter for immunization: Secondary | ICD-10-CM | POA: Diagnosis not present

## 2018-07-02 DIAGNOSIS — R404 Transient alteration of awareness: Secondary | ICD-10-CM | POA: Diagnosis not present

## 2018-07-02 DIAGNOSIS — K219 Gastro-esophageal reflux disease without esophagitis: Secondary | ICD-10-CM | POA: Diagnosis not present

## 2018-07-02 DIAGNOSIS — K760 Fatty (change of) liver, not elsewhere classified: Secondary | ICD-10-CM | POA: Diagnosis not present

## 2018-07-02 DIAGNOSIS — M2550 Pain in unspecified joint: Secondary | ICD-10-CM

## 2018-07-02 LAB — BAYER DCA HB A1C WAIVED: HB A1C (BAYER DCA - WAIVED): 5.2 % (ref <7.0)

## 2018-07-02 MED ORDER — OMEPRAZOLE 20 MG PO CPDR: DELAYED_RELEASE_CAPSULE | ORAL | 0 refills | Status: DC

## 2018-07-02 NOTE — Patient Instructions (Signed)
You had labs performed today.  You will be contacted with the results of the labs once they are available, usually in the next 3 business days for routine lab work.   
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
## 2018-07-03 LAB — ANA W/REFLEX IF POSITIVE: ANA: NEGATIVE

## 2018-07-03 LAB — SPECIMEN STATUS

## 2018-07-03 LAB — SPECIMEN STATUS REPORT

## 2018-07-03 LAB — RHEUMATOID FACTOR: Rhuematoid fact SerPl-aCnc: <10 IU/mL (ref 0.0–13.9)

## 2018-07-03 NOTE — Addendum Note (Signed)
Addended byCarrolyn Leigh on: 07/03/2018 02:51 PM   Modules accepted: Orders

## 2018-07-19 ENCOUNTER — Encounter: Payer: Self-pay | Admitting: Family Medicine

## 2018-07-25 ENCOUNTER — Ambulatory Visit (INDEPENDENT_AMBULATORY_CARE_PROVIDER_SITE_OTHER): Payer: 59 | Admitting: Family Medicine

## 2018-07-25 ENCOUNTER — Encounter: Payer: Self-pay | Admitting: Family Medicine

## 2018-07-25 VITALS — BP 107/75 | HR 79 | Temp 97.8°F | Ht 62.0 in | Wt 125.0 lb

## 2018-07-25 DIAGNOSIS — R413 Other amnesia: Secondary | ICD-10-CM

## 2018-07-25 DIAGNOSIS — R404 Transient alteration of awareness: Secondary | ICD-10-CM

## 2018-07-25 DIAGNOSIS — R2990 Unspecified symptoms and signs involving the nervous system: Secondary | ICD-10-CM

## 2018-07-25 DIAGNOSIS — R55 Syncope and collapse: Secondary | ICD-10-CM | POA: Diagnosis not present

## 2018-07-25 DIAGNOSIS — H539 Unspecified visual disturbance: Secondary | ICD-10-CM

## 2018-07-25 NOTE — Patient Instructions (Addendum)
Your MRI will be arranged.  I have placed a referral to neurology as well.   Visual Disturbances  A visual disturbance is any problem that interferes with your normal vision. This can affect one eye or both eyes. Some types of visual disturbances come and go without treatment and do not cause a permanent problem. Other visual disturbances may be a sign of a medical emergency. Visual disturbances include:  Blurred vision.  Being unable to see certain colors.  Being sensitive to light.  Double vision.  Partial vision loss (visual field deficit).  Being unaware of objects on one side of the body (visual spatial inattention).  Rhythmic eye movements that you cannot control (nystagmus).  Short-term or long-term blindness.  Seeing: ? Floating spots or lines (floaters). ? Flashing or shimmering lights. ? Zigzagging lines or stars. ? The floor as tilted (visual midline shift). ? Things that are not really there (hallucinations). Causes of visual disturbances include:  Eye infection.  The thin membrane at the back of the eye separating from the eyeball (retinal detachment).  High blood pressure.  Migraine.  Glaucoma.  Ischemic stroke.  Cerebral aneurysm. It is important to get your eyes checked by a health care provider or eye specialist (ophthalmologist or optometrist) as soon as possible to determine the cause of your visual disturbance. Follow these instructions at home:  Take over-the-counter and prescription medicines only as told by your health care provider.  Do not use any products that contain nicotine or tobacco, such as cigarettes and e-cigarettes. If you need help quitting, ask your health care provider.  To lower your risk of the problems that can lead to visual disturbances: ? Eat a balanced diet that includes fruits and vegetables, whole grains, lean meat, and low-fat dairy. ? Maintain a healthy weight. Work with your health care provider to lose weight if  you need to. ? Exercise regularly. Ask your health care provider what activities are safe for you.  Do not drive if you have trouble seeing. Ask your health care provider for guidance about when it is and is not safe for you to drive.  Keep all follow-up visits as told by your health care provider. This is important. Contact a health care provider if:  Your visual disturbance changes or becomes worse. Get help right away if you:   Have new visual disturbances.  Suddenly see flashing lights or floaters.  Suddenly have a dark area in your field of vision, especially in the lower part. This can lead to a loss of central vision.  Lose vision in one or both eyes.  Have any symptoms of a stroke. "BE FAST" is an easy way to remember the main warning signs of a stroke: ? B - Balance. Signs are dizziness, sudden trouble walking, or loss of balance. ? E - Eyes. Signs are trouble seeing or a sudden change in vision. ? F - Face. Signs are sudden weakness or numbness of the face, or the face or eyelid drooping on one side. ? A - Arms. Signs are weakness or numbness in an arm. This happens suddenly and usually on one side of the body. ? S - Speech. Signs are sudden trouble speaking, slurred speech, or trouble understanding what people say. ? T - Time. Time to call emergency services. Write down what time symptoms started.  Have other signs of a stroke, such as: ? A sudden, severe headache with no known cause. ? Nausea or vomiting. ? Seizure. These symptoms may represent  a serious problem that is an emergency. Do not wait to see if the symptoms will go away. Get medical help right away. Call your local emergency services (911 in the [place] Do not drive yourself to the hospital. Summary  A visual disturbance is any problem that interferes with your normal vision.  Some visual disturbances may be a sign of a medical emergency.  It is important to get your eyes checked by a health care provider  or eye specialist to determine what kind of visual disturbance you have. This information is not intended to replace advice given to you by your health care provider. Make sure you discuss any questions you have with your health care provider. Document Released: 09/01/2004 Document Revised: 08/15/2017 Document Reviewed: 08/15/2017 Elsevier Interactive Patient Education  2019 [employer].
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
## 2018-08-03 ENCOUNTER — Ambulatory Visit (HOSPITAL_COMMUNITY)
Admission: RE | Admit: 2018-08-03 | Discharge: 2018-08-03 | Disposition: A | Discharge status: Home / Self Care | Payer: 59 | Source: Ambulatory Visit | Attending: Family Medicine | Admitting: Family Medicine

## 2018-08-03 DIAGNOSIS — R2990 Unspecified symptoms and signs involving the nervous system: Secondary | ICD-10-CM | POA: Diagnosis not present

## 2018-08-03 DIAGNOSIS — R404 Transient alteration of awareness: Secondary | ICD-10-CM | POA: Diagnosis present

## 2018-08-03 DIAGNOSIS — R55 Syncope and collapse: Secondary | ICD-10-CM | POA: Diagnosis present

## 2018-08-03 DIAGNOSIS — R413 Other amnesia: Secondary | ICD-10-CM | POA: Insufficient documentation

## 2018-08-03 DIAGNOSIS — H539 Unspecified visual disturbance: Secondary | ICD-10-CM

## 2018-08-16 ENCOUNTER — Ambulatory Visit (INDEPENDENT_AMBULATORY_CARE_PROVIDER_SITE_OTHER): Payer: 59 | Admitting: Family Medicine

## 2018-08-16 ENCOUNTER — Encounter: Payer: Self-pay | Admitting: Family Medicine

## 2018-08-16 VITALS — BP 118/69 | HR 86 | Temp 97.9°F | Ht 62.0 in | Wt 124.0 lb

## 2018-08-16 DIAGNOSIS — E559 Vitamin D deficiency, unspecified: Secondary | ICD-10-CM

## 2018-08-16 MED ORDER — CHOLECALCIFEROL 1.25 MG (50000 UT) PO CAPS: 50000.0000 [IU] | ORAL_CAPSULE | ORAL | 0 refills | Status: DC

## 2018-08-16 NOTE — Patient Instructions (Signed)
Vitamin D Deficiency  Vitamin D deficiency is when your body does not have enough vitamin D. Vitamin D is important because:   It helps your body use other minerals that your body needs.   It helps keep your bones strong and healthy.   It may help to prevent some diseases.   It helps your heart and other muscles work well.  You can get vitamin D by:   Eating foods with vitamin D in them.   Drinking or eating milk or other foods that have had vitamin D added to them.   Taking a vitamin D supplement.   Being in the sun.  Not getting enough vitamin D can make your bones become soft. It can also cause other health problems.  Follow these instructions at home:   Take medicines and supplements only as told by your doctor.   Eat foods that have vitamin D. These include:  ? Dairy products, cereals, or juices with added vitamin D. Check the label for vitamin D.  ? Fatty fish like salmon or trout.  ? Eggs.  ? Oysters.   Do not use tanning beds.   Stay at a healthy weight. Lose weight, if needed.   Keep all follow-up visits as told by your doctor. This is important.  Contact a doctor if:   Your symptoms do not go away.   You feel sick to your stomach (nauseous).   Youthrow up (vomit).   You poop less often than usual or you have trouble pooping (constipation).  This information is not intended to replace advice given to you by your health care provider. Make sure you discuss any questions you have with your health care provider.  Document Released: 07/14/2011 Document Revised: 12/31/2015 Document Reviewed: 12/10/2014  Elsevier Interactive Patient Education  2019 [employer]

## 2018-08-16 NOTE — Progress Notes (Signed)
Subjective: CC: Review labs PCP: Helen Norlander, DO [facility] Helen Rice is a 43 y.o. female presenting to clinic today for:  1.  Review labs Patient had labs obtained by an outside provider.  She was seeing an integrative specialist and wanted to review some of the labs that were obtained at that appointment.  She was told that she had 7 infections by a previous innovative specialist and this was further evaluated by the new specialist.  She states that she was recently prescribed Valtrex for a chronic EBV infection but has not yet started taking the medicine.  She continues to exhibit fatigue and lower extremity weakness.  She has a follow-up with neurology in the next couple of months for her transient alteration of awareness and memory lapses.  Of note, recent MRI was negative for any acute findings or abnormalities.  She was also instructed to start vitamin D and B12.  Of note, she has had 2 labs obtained by the integrative specialist one which revealed a true vitamin D deficiency with vitamin D less than 20 and the other which was vitamin D insufficiency.  She had been taking a vitamin D supplement while she was being treated for what was thought to be a chronic Lyme infection but has since discontinued the vitamin D supplement.  Additionally, she is expected to see GI again for a possible complication of her gallbladder surgery.   ROS: Per HPI  Allergies  Allergen Reactions  . Influenza Vaccines     Cannot take  . Latex     rash  . Sulfur Rash   Past Medical History:  Diagnosis Date  . Fibromyalgia   . GERD (gastroesophageal reflux disease) 05/30/2017  . Lyme disease    being seen by [place]  . Psoriasis     Current Outpatient Medications:  .  conjugated estrogens (PREMARIN) vaginal cream, Place 1 Applicatorful vaginally daily., Disp: 42.5 g, Rfl: 12 .  estradiol (ESTRACE) 2 MG tablet, Take 2 mg by mouth daily., Disp: , Rfl:  .  omeprazole (PRILOSEC) 20  MG capsule, Take 20mg  PO up to BID prn stomach pain/ reflux, Disp: 60 capsule, Rfl: 0 Social History   Socioeconomic History  . Marital status: Married    Spouse name: Not on file  . Number of children: Not on file  . Years of education: Not on file  . Highest education level: Not on file  Occupational History  . Not on file  Social Needs  . Financial resource strain: Not on file  . Food insecurity:    Worry: Not on file    Inability: Not on file  . Transportation needs:    Medical: Not on file    Non-medical: Not on file  Tobacco Use  . Smoking status: Former Smoker    Packs/day: 1.50    Years: 15.00    Pack years: 22.50    Last attempt to quit: 2016    Years since quitting: 4.0  . Smokeless tobacco: Never Used  Substance and Sexual Activity  . Alcohol use: Yes    Comment: occ  . Drug use: No  . Sexual activity: Yes  Lifestyle  . Physical activity:    Days per week: Not on file    Minutes per session: Not on file  . Stress: Not on file  Relationships  . Social connections:    Talks on phone: Not on file    Gets together: Not on file    Attends  religious service: Not on file    Active member of club or organization: Not on file    Attends meetings of clubs or organizations: Not on file    Relationship status: Not on file  . Intimate partner violence:    Fear of current or ex partner: Not on file    Emotionally abused: Not on file    Physically abused: Not on file    Forced sexual activity: Not on file  Other Topics Concern  . Not on file  Social History Narrative  . Not on file   Family History  Problem Relation Age of Onset  . Breast cancer Sister   . Cancer Father   . Psoriasis Father   . Autoimmune disease Father   . Ovarian cancer Paternal Grandmother 68  . Thyroid disease Maternal Aunt   . Heart disease Maternal Grandmother   . Heart disease Maternal Grandfather   . Ovarian cancer Paternal Grandfather     Objective: Office vital signs  reviewed. BP 118/69   Pulse 86   Temp 97.9 F (36.6 C) (Oral)   Ht 5\' 2"  (1.575 m)   Wt 124 lb (56.2 kg)   BMI 22.68 kg/m   Physical Examination:  General: Awake, alert, No acute distress HEENT: sclera white, MMM Pulm: Normal work of breathing on room air Psych: Mood somewhat depressed.  She is somewhat stressed during today's exam. Depression screen [facility] 2/9 08/16/2018 07/25/2018 07/02/2018  Decreased Interest 1 1 1   Down, Depressed, Hopeless 1 1 1   PHQ - 2 Score 2 2 2   Altered sleeping 1 1 2   Tired, decreased energy 3 2 3   Change in appetite 0 0 1  Feeling bad or failure about yourself  0 0 0  Trouble concentrating 0 1 1  Moving slowly or fidgety/restless 0 0 1  Suicidal thoughts 0 0 0  PHQ-9 Score 6 6 10   Difficult doing work/chores - Not difficult at all Not difficult at all    Assessment/ Plan: 43 y.o. female   1. Vitamin D deficiency Vitamin D level noted to be 16.8 in January 2019 and 26.8 in October 2019.  She was never prescribed high-dose vitamin D.  Cholecalciferol 50,000 international units q. 7 days for the next 12 weeks prescribed.  We will plan to recheck her vitamin D level in 12 weeks.  We did discuss that after that treatment she should maintain with over-the-counter vitamin D.  I am unsure as to how to interpret her chronic antibodies to Epstein-Barr.  I am not aware of any treatments with antivirals for this issue.  I will reach out to infectious disease to see if they have an opinion and reach out to the patient once I have more information.  No orders of the defined types were placed in this encounter.  Meds ordered this encounter  Medications  . Cholecalciferol 1.25 MG (50000 UT) capsule    Sig: Take 1 capsule (50,000 Units total) by mouth every 7 (seven) days. x12 weeks    Dispense:  12 capsule    Refill:  0   Total time spent with patient 25 minutes.  Greater than 50% of encounter spent in coordination of care/counseling.   Helen Norlander,  DO [place] [phone]

## 2018-08-22 ENCOUNTER — Telehealth: Payer: Self-pay | Admitting: Family Medicine

## 2018-08-22 ENCOUNTER — Telehealth: Payer: Self-pay | Admitting: *Deleted

## 2018-08-22 NOTE — Telephone Encounter (Signed)
I was able to get in contact with Dr. Drucilla Schmidt with infectious disease regarding patient's concerns about the EBV labs she had done with integrative medicine.  He agrees that the EBV titers she had performed were reflective of previous infection and agrees that there are evidence-based studies at this time to support use of Valtrex or acyclovir for treatment of a previous EBV infection.  He also noted that outside of active, acute Lyme disease there has not been any good data to support use of antibacterial agents.  If she returns my call, please inform her of the above.  We will continue to monitor her progress with regards to vitamin D and other chronic medical needs.  Jacara Benito M. Lajuana Ripple, [facility]

## 2018-08-22 NOTE — Telephone Encounter (Signed)
Spoke with patient.  Gave her message from Dr. Lajuana Ripple in previous phone note from today regarding EBV.

## 2018-09-25 ENCOUNTER — Encounter: Payer: Self-pay | Admitting: Neurology

## 2018-09-25 ENCOUNTER — Ambulatory Visit (INDEPENDENT_AMBULATORY_CARE_PROVIDER_SITE_OTHER): Payer: 59 | Admitting: Neurology

## 2018-09-25 DIAGNOSIS — M545 Low back pain, unspecified: Secondary | ICD-10-CM | POA: Insufficient documentation

## 2018-09-25 DIAGNOSIS — R404 Transient alteration of awareness: Secondary | ICD-10-CM | POA: Diagnosis not present

## 2018-09-25 DIAGNOSIS — F419 Anxiety disorder, unspecified: Secondary | ICD-10-CM | POA: Diagnosis not present

## 2018-09-25 DIAGNOSIS — R29898 Other symptoms and signs involving the musculoskeletal system: Secondary | ICD-10-CM | POA: Diagnosis not present

## 2018-09-25 MED ORDER — BUSPIRONE HCL 15 MG PO TABS: 15.0000 mg | ORAL_TABLET | Freq: Two times a day (BID) | ORAL | 5 refills | Status: DC
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
## 2018-09-26 ENCOUNTER — Encounter: Payer: Self-pay | Admitting: Family Medicine

## 2018-09-27 ENCOUNTER — Telehealth: Payer: Self-pay | Admitting: Neurology

## 2018-09-27 NOTE — Telephone Encounter (Signed)
[employer] pending faxed clnical notes.

## 2018-10-02 NOTE — Telephone Encounter (Signed)
lvm for pt to call back about scheduling mri  [id number] (exp. 09/27/18 to 11/11/18)

## 2018-10-02 NOTE — Telephone Encounter (Signed)
Patient returned my call she is scheduled for 10/09/18 at [facility].

## 2018-10-08 ENCOUNTER — Telehealth: Payer: Self-pay | Admitting: Neurology

## 2018-10-08 MED ORDER — ALPRAZOLAM 0.5 MG PO TABS: ORAL_TABLET | ORAL | 0 refills | Status: DC

## 2018-10-08 NOTE — Telephone Encounter (Signed)
I called pt back. She has taken xanax before and did ok. Advised we will only call back if Dr. Felecia Shelling is not ok with calling in rx. Explained she can take 1-2 tablets 30-46min prior to test. Can take additional tablet at time of test prn. Must have driver to and from test because it can cause drowsiness. She verbalized understanding.

## 2018-10-08 NOTE — Telephone Encounter (Signed)
Pt is claustrophobic and needs something for MRI tomorrow. Pharmacy: [facility]. Please call to advise

## 2018-10-09 ENCOUNTER — Ambulatory Visit: Payer: 59

## 2018-10-09 DIAGNOSIS — R29898 Other symptoms and signs involving the musculoskeletal system: Secondary | ICD-10-CM

## 2018-10-09 DIAGNOSIS — M545 Low back pain, unspecified: Secondary | ICD-10-CM

## 2018-10-11 ENCOUNTER — Telehealth: Payer: Self-pay | Admitting: *Deleted

## 2018-10-11 NOTE — Telephone Encounter (Signed)
-----   Message from Britt Bottom, MD sent at 10/11/2018 12:40 PM EST ----- Please let her know that the MRI of the lumbar spine showed some mild arthritic changes in the lower lumbar spine but nothing that was pushing on the nerve roots.

## 2018-10-23 ENCOUNTER — Encounter: Payer: Self-pay | Admitting: Family Medicine

## 2018-10-31 ENCOUNTER — Other Ambulatory Visit: Payer: Self-pay

## 2018-10-31 ENCOUNTER — Ambulatory Visit (INDEPENDENT_AMBULATORY_CARE_PROVIDER_SITE_OTHER): Payer: 59 | Admitting: Neurology

## 2018-10-31 DIAGNOSIS — R4182 Altered mental status, unspecified: Secondary | ICD-10-CM

## 2018-10-31 DIAGNOSIS — R404 Transient alteration of awareness: Secondary | ICD-10-CM

## 2018-11-12 NOTE — Progress Notes (Signed)
     [facility]  EEG (ELECTROENCEPHALOGRAM) REPORT   STUDY DATE: 10/31/2018 PATIENT NAME: Helen Rice DOB: [birth year] MRN: [id number]  ORDERING CLINICIAN: Sater  TECHNOLOGIST: Nonda Lou, RPSGT TECHNIQUE: Electroencephalogram was recorded utilizing standard 10-20 system of lead placement and reformatted into average and bipolar montages.  RECORDING TIME: 22 minutes  CLINICAL INFORMATION:  43 yo woman with transient alteration of awareness  FINDINGS: A digital EEG was performed while the patient was awake and drowsy. While awake and most alert there was a 10 hz posterior dominant rhythm. Voltages and frequencies were symmetric.  There were no focal, lateralizing, epileptiform activity or seizures seen.  Photic stimulation had a normal driving response. Hyperventilation and recovery did not change the underlying rhythms. She briefly became drowsy and entered stage 1 sleep.  There was some occipital theta during drowsiness, unlikely to be significant.   EKG channel shows NSR  IMPRESSION: This was a normal EEG while the patient was awake and briefly asleep.     INTERPRETING PHYSICIAN:   Richard A. Felecia Shelling, MD, PhD, [facility] Certified in Neurology, Clinical Neurophysiology, Sleep Medicine, Pain Medicine and Neuroimaging  [facility] [address] [place], [place] [address] [phone]

## 2018-12-24 ENCOUNTER — Telehealth: Payer: Self-pay | Admitting: Neurology

## 2018-12-24 NOTE — Telephone Encounter (Signed)
Pt called in to provide consent for video visit and to file insurance, link sent to pt through text , pt confirmed it was received

## 2018-12-24 NOTE — Telephone Encounter (Signed)
Called, LVM for pt to call today so I can update her med list/pharmacy/allergies on file before VV tomorrow

## 2018-12-25 ENCOUNTER — Ambulatory Visit (INDEPENDENT_AMBULATORY_CARE_PROVIDER_SITE_OTHER): Payer: 59 | Admitting: Neurology

## 2018-12-25 ENCOUNTER — Encounter: Payer: Self-pay | Admitting: Neurology

## 2018-12-25 ENCOUNTER — Other Ambulatory Visit: Payer: Self-pay

## 2018-12-25 DIAGNOSIS — G4489 Other headache syndrome: Secondary | ICD-10-CM

## 2018-12-25 DIAGNOSIS — F419 Anxiety disorder, unspecified: Secondary | ICD-10-CM

## 2018-12-25 DIAGNOSIS — R404 Transient alteration of awareness: Secondary | ICD-10-CM

## 2018-12-25 DIAGNOSIS — E559 Vitamin D deficiency, unspecified: Secondary | ICD-10-CM

## 2018-12-25 MED ORDER — LAMOTRIGINE 100 MG PO TABS: 100.0000 mg | ORAL_TABLET | Freq: Two times a day (BID) | ORAL | 5 refills | Status: DC

## 2018-12-25 NOTE — Telephone Encounter (Signed)
Called, LVM again for pt to call office before her VV so I can update her chart.
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
## 2018-12-25 NOTE — Telephone Encounter (Signed)
Took call from phone staff. Pt having trouble connecting fully (microphone and camera) w/ [email or web address]. She has been on the phone the times she has tried. Advised she cannot be on a phone call while trying to connect. This should resolve her issue. She will try this. If still not working, she will send [employer].  I updated med list/pharmacy/allergies on file.

## 2019-01-25 ENCOUNTER — Other Ambulatory Visit: Payer: Self-pay

## 2019-01-25 ENCOUNTER — Ambulatory Visit (INDEPENDENT_AMBULATORY_CARE_PROVIDER_SITE_OTHER): Payer: 59 | Admitting: Family Medicine

## 2019-01-25 DIAGNOSIS — Z13220 Encounter for screening for lipoid disorders: Secondary | ICD-10-CM

## 2019-01-25 DIAGNOSIS — E559 Vitamin D deficiency, unspecified: Secondary | ICD-10-CM

## 2019-01-25 DIAGNOSIS — J0110 Acute frontal sinusitis, unspecified: Secondary | ICD-10-CM

## 2019-01-25 DIAGNOSIS — Z5181 Encounter for therapeutic drug level monitoring: Secondary | ICD-10-CM

## 2019-01-25 DIAGNOSIS — G4489 Other headache syndrome: Secondary | ICD-10-CM | POA: Diagnosis not present

## 2019-01-25 DIAGNOSIS — R404 Transient alteration of awareness: Secondary | ICD-10-CM | POA: Diagnosis not present

## 2019-01-25 NOTE — Patient Instructions (Addendum)
Start vitamin D over-the-counter 400 international units daily.  Come in for fasting labs in the next week or so.  If you develop any worrisome symptoms or signs concerning for COVID-19 infection, you will need to hold off on this.  Start back on Flonase.  We discussed that you can use 1 spray in each nostril twice a day or you can use 2 sprays in each nostril daily to help with sinus pressure related to allergies.  If you continue to have regular headaches I would readdress this with the neurologist.  We discussed that the Lamictal concentration in your blood can be altered with use of estrogen.  I would recommend speaking to your gynecologist with regards to need for ongoing estrogen replacement, particularly since you do not find a difference in symptoms with or without the medication.

## 2019-01-25 NOTE — Progress Notes (Signed)
Telephone visit  Subjective: CC:f/u transient alteration of awareness PCP: [name], DO Helen Rice is a 43 y.o. female calls for telephone consult today. Patient provides verbal consent for consult held via phone.  Location of patient: Home Location of provider: Working remotely from home Others present for call: None  1.  Transient alteration of awareness Patient saw the neurologist in May was started on Lamictal 100 mg twice daily.  She is slowly titrating up to the 100 mg twice daily.  She notes that recently she found out that these pills could interfere with the estrogen and therefore she discontinued estrogen.  She has not felt any different off of the estrogen as she did when she was on it and wonders if she should even continue with the medication.  Overall she seems to be tolerating the Lamictal without difficulty.  2.  Vitamin D deficiency Patient reports that she felt a world of difference on the prescription vitamin D.  Energy improved.  She has not continued OTC vitamin D after completion of the prescription version but would be interested in doing this.  She wants to come in for vitamin D check.  3.  Sinus pressure headache Patient reports chronic sinus headache.  She notes that she is tried OTC allergy medicine but has not noticed a great improvement in symptoms.  She does report sinus congestion when the headaches occur.  She has mentioned the headaches to the neurologist.  Additionally, she notes she has a Flonase nasal spray at home but does not use this.  No fevers, chills, rhinorrhea, purulence from nares.   ROS: Per HPI  Allergies  Allergen Reactions  . Influenza Vaccines     Cannot take  . Latex     rash  . Sulfur Rash   Past Medical History:  Diagnosis Date  . Fibromyalgia   . GERD (gastroesophageal reflux disease) 05/30/2017  . Lyme disease    being seen by [facility]  . Psoriasis     Current Outpatient Medications:  .   ALPRAZolam (XANAX) 0.5 MG tablet, Take 1-2 tablets 30-60 min prior to test. Can take additional tablet at time of test if needed. Must have drive to and from test., Disp: 3 tablet, Rfl: 0 .  busPIRone (BUSPAR) 15 MG tablet, Take 1 tablet (15 mg total) by mouth 2 (two) times daily., Disp: 60 tablet, Rfl: 5 .  conjugated estrogens (PREMARIN) vaginal cream, Place 1 Applicatorful vaginally daily., Disp: 42.5 g, Rfl: 12 .  estradiol (ESTRACE) 2 MG tablet, Take 2 mg by mouth daily., Disp: , Rfl:  .  lamoTRIgine (LAMICTAL) 100 MG tablet, Take 1 tablet (100 mg total) by mouth 2 (two) times daily., Disp: 60 tablet, Rfl: 5 .  omeprazole (PRILOSEC) 20 MG capsule, Take 27m PO up to BID prn stomach pain/ reflux (Patient taking differently: Take 20 mg by mouth as needed. Take 299mPO up to BID prn stomach pain/ reflux), Disp: 60 capsule, Rfl: 0 .  valACYclovir (VALTREX) 1000 MG tablet, , Disp: , Rfl:   Assessment/ Plan: [age].o. female   1. Vitamin D deficiency Instructed her to start over-the-counter vitamin D 400 international units daily.  Plan to recheck vitamin D level, CMP - CMP14+EGFR; Future - VITAMIN D 25 Hydroxy (Vit-D Deficiency, Fractures); Future  2. Transient alteration of awareness Started on Lamictal and is about to transition over to 100 mg twice daily.  Will check CMP and CBC - CMP14+EGFR; Future - CBC; Future  3. Other headache syndrome Possibly related to allergy and sinus symptoms.  I have instructed her to resume use of Flonase nasal spray.  Continue to follow-up with neurology as scheduled  4. Subacute frontal sinusitis Allergy medicine as above  5. Medication monitoring encounter - CMP14+EGFR; Future - CBC; Future  6. Screening, lipid Plan for fasting lipid along with other labs.  We will plan to complete insurance form when she sees me in office on 7 July. - Lipid Panel; Future   Start time: 10:43am (LVM), 11:03am End time: 11:28am  Total time spent on patient care  (including telephone call/ virtual visit): 32 minutes  [place], [place] [phone]

## 2019-01-31 ENCOUNTER — Encounter: Payer: Self-pay | Admitting: Family Medicine

## 2019-02-04 ENCOUNTER — Ambulatory Visit: Payer: 59 | Admitting: Family Medicine

## 2019-02-11 ENCOUNTER — Ambulatory Visit: Payer: Self-pay | Admitting: Family Medicine

## 2019-02-12 ENCOUNTER — Telehealth: Payer: Self-pay | Admitting: Family Medicine

## 2019-02-12 NOTE — Telephone Encounter (Signed)
lmtcb

## 2019-02-12 NOTE — Telephone Encounter (Signed)
Patient aware future labs are already placed in her chart.

## 2019-02-13 ENCOUNTER — Ambulatory Visit: Payer: 59 | Admitting: Family Medicine

## 2019-02-18 ENCOUNTER — Encounter: Payer: Self-pay | Admitting: Family Medicine

## 2019-02-18 ENCOUNTER — Other Ambulatory Visit: Payer: Self-pay | Admitting: Family Medicine

## 2019-02-18 MED ORDER — AZELASTINE HCL 0.1 % NA SOLN: 1.0000 | Freq: Two times a day (BID) | NASAL | 12 refills | Status: DC

## 2019-02-22 ENCOUNTER — Other Ambulatory Visit: Payer: Self-pay

## 2019-02-22 ENCOUNTER — Other Ambulatory Visit: Payer: 59

## 2019-02-22 DIAGNOSIS — Z13220 Encounter for screening for lipoid disorders: Secondary | ICD-10-CM

## 2019-02-22 DIAGNOSIS — R404 Transient alteration of awareness: Secondary | ICD-10-CM

## 2019-02-22 DIAGNOSIS — Z5181 Encounter for therapeutic drug level monitoring: Secondary | ICD-10-CM

## 2019-02-22 DIAGNOSIS — E559 Vitamin D deficiency, unspecified: Secondary | ICD-10-CM

## 2019-02-23 LAB — CBC
Hematocrit: 36 % (ref 34.0–46.6)
Hemoglobin: 12.6 g/dL (ref 11.1–15.9)
MCH: 30.4 pg (ref 26.6–33.0)
MCHC: 35 g/dL (ref 31.5–35.7)
MCV: 87 fL (ref 79–97)
Platelets: 314 10*3/uL (ref 150–450)
RBC: 4.14 x10E6/uL (ref 3.77–5.28)
RDW: 12.6 % (ref 11.7–15.4)
WBC: 5.7 10*3/uL (ref 3.4–10.8)

## 2019-02-23 LAB — CMP14+EGFR
ALT: 16 IU/L (ref 0–32)
AST: 16 IU/L (ref 0–40)
Albumin/Globulin Ratio: 2.6 — ABNORMAL HIGH (ref 1.2–2.2)
Albumin: 4.9 g/dL — ABNORMAL HIGH (ref 3.8–4.8)
Alkaline Phosphatase: 106 IU/L (ref 39–117)
BUN/Creatinine Ratio: 17 (ref 9–23)
BUN: 14 mg/dL (ref 6–24)
Bilirubin Total: 0.3 mg/dL (ref 0.0–1.2)
CO2: 24 mmol/L (ref 20–29)
Calcium: 9.8 mg/dL (ref 8.7–10.2)
Chloride: 100 mmol/L (ref 96–106)
Creatinine, Ser: 0.82 mg/dL (ref 0.57–1.00)
GFR calc Af Amer: 101 mL/min/{1.73_m2} (ref >59)
GFR calc non Af Amer: 87 mL/min/{1.73_m2} (ref >59)
Globulin, Total: 1.9 g/dL (ref 1.5–4.5)
Glucose: 85 mg/dL (ref 65–99)
Potassium: 4.4 mmol/L (ref 3.5–5.2)
Sodium: 144 mmol/L (ref 134–144)
Total Protein: 6.8 g/dL (ref 6.0–8.5)

## 2019-02-23 LAB — LIPID PANEL
Chol/HDL Ratio: 3.7 ratio (ref 0.0–4.4)
Cholesterol, Total: 180 mg/dL (ref 100–199)
HDL: 49 mg/dL (ref >39)
LDL Calculated: 95 mg/dL (ref 0–99)
Triglycerides: 178 mg/dL — ABNORMAL HIGH (ref 0–149)
VLDL Cholesterol Cal: 36 mg/dL (ref 5–40)

## 2019-02-23 LAB — VITAMIN D 25 HYDROXY (VIT D DEFICIENCY, FRACTURES): Vit D, 25-Hydroxy: 40.1 ng/mL (ref 30.0–100.0)

## 2019-02-28 ENCOUNTER — Other Ambulatory Visit: Payer: Self-pay

## 2019-02-28 ENCOUNTER — Ambulatory Visit (INDEPENDENT_AMBULATORY_CARE_PROVIDER_SITE_OTHER): Payer: 59 | Admitting: Family Medicine

## 2019-02-28 ENCOUNTER — Encounter: Payer: Self-pay | Admitting: Family Medicine

## 2019-02-28 VITALS — BP 120/70 | HR 68 | Temp 98.6°F | Ht 62.0 in | Wt 124.0 lb

## 2019-02-28 DIAGNOSIS — F411 Generalized anxiety disorder: Secondary | ICD-10-CM | POA: Diagnosis not present

## 2019-02-28 DIAGNOSIS — R1010 Upper abdominal pain, unspecified: Secondary | ICD-10-CM | POA: Diagnosis not present

## 2019-02-28 NOTE — Progress Notes (Signed)
Dr. Lajuana Ripple, Adding an SSRI should be fine.  As her anxiety does not seem to be responding well to medications, she might need referral to psychiatry as well.  Richard

## 2019-02-28 NOTE — Progress Notes (Signed)
Subjective: CC: Follow-up labs, abdominal pain PCP: [name], DO Helen Rice is a 43 y.o. female presenting to clinic today for:  1.  Abdominal pain Patient with longstanding history of abdominal pain that seems to be epigastric in nature.  She denies any associated nausea, vomiting.  She does "she intermittently spits up blood".  She has had scant blood in the stool before and has had work-up for this by gastroenterology in [place].  She has a medical history significant for laparoscopic cholecystectomy.  No bile leaks were noted on HIDA scan.  She had an ultrasound after her lap chole that did show a postoperative fluid collection.  She was supposed to undergo a repeat ultrasound to check this out again but I do not see where this was done.  Of note she also had a CAT scan shortly after the ultrasound which showed a much smaller fluid collection.  She reports compliance with Prilosec 20 mg as directed  2.  Anxiety disorder Patient does not feel that her anxiety disorder is well controlled with the Lamictal and buspirone.  She is followed by Dr. Felecia Shelling who she was referred to for transient alteration of awareness.  She started on both buspirone and later Lamictal to also help with mood and possible seizure-like disorder.  She continues to have substantial anxiety, preventing her from driving much of the time.  She wants to know if the Lamictal can be increased.   ROS: Per HPI  Allergies  Allergen Reactions  . Influenza Vaccines     Cannot take  . Latex     rash  . Sulfur Rash   Past Medical History:  Diagnosis Date  . Fibromyalgia   . GERD (gastroesophageal reflux disease) 05/30/2017  . Lyme disease    being seen by [facility]  . Psoriasis     Current Outpatient Medications:  .  azelastine (ASTELIN) 0.1 % nasal spray, Place 1 spray into both nostrils 2 (two) times daily., Disp: 30 mL, Rfl: 12 .  busPIRone (BUSPAR) 15 MG tablet, Take 1 tablet  (15 mg total) by mouth 2 (two) times daily., Disp: 60 tablet, Rfl: 5 .  conjugated estrogens (PREMARIN) vaginal cream, Place 1 Applicatorful vaginally daily., Disp: 42.5 g, Rfl: 12 .  estradiol (ESTRACE) 2 MG tablet, Take 2 mg by mouth daily., Disp: , Rfl:  .  fluticasone (FLONASE) 50 MCG/ACT nasal spray, Place 2 sprays into both nostrils daily., Disp: , Rfl:  .  lamoTRIgine (LAMICTAL) 100 MG tablet, Take 1 tablet (100 mg total) by mouth 2 (two) times daily., Disp: 60 tablet, Rfl: 5 .  omeprazole (PRILOSEC) 20 MG capsule, Take 55m PO up to BID prn stomach pain/ reflux (Patient taking differently: Take 20 mg by mouth as needed. Take 254mPO up to BID prn stomach pain/ reflux), Disp: 60 capsule, Rfl: 0 .  valACYclovir (VALTREX) 1000 MG tablet, , Disp: , Rfl:  Social History   Socioeconomic History  . Marital status: Married    Spouse name: Not on file  . Number of children: 1  . Years of education: GED  . Highest education level: Not on file  Occupational History  . Occupation: [employer]  . Financial resource strain: Not on file  . Food insecurity    Worry: Not on file    Inability: Not on file  . Transportation needs    Medical: Not on file    Non-medical: Not on file  Tobacco Use  .  Smoking status: Former Smoker    Packs/day: 1.50    Years: 15.00    Pack years: 22.50    Quit date: 2016    Years since quitting: 4.5  . Smokeless tobacco: Never Used  Substance and Sexual Activity  . Alcohol use: Yes    Comment: occ  . Drug use: No  . Sexual activity: Yes  Lifestyle  . Physical activity    Days per week: Not on file    Minutes per session: Not on file  . Stress: Not on file  Relationships  . Social Herbalist on phone: Not on file    Gets together: Not on file    Attends religious service: Not on file    Active member of club or organization: Not on file    Attends meetings of clubs or organizations: Not on file    Relationship status: Not on file  .  Intimate partner violence    Fear of current or ex partner: Not on file    Emotionally abused: Not on file    Physically abused: Not on file    Forced sexual activity: Not on file  Other Topics Concern  . Not on file  Social History Narrative   Right handed    Caffeine use: Drinks 1 cup coffee per day   1-3 sodas per day   Lives with husband    Recent Results (from the past 2160 hour(s))  Lipid Panel     Status: Abnormal   Collection Time: 02/22/19 10:39 AM  Result Value Ref Range   Cholesterol, Total 180 100 - 199 mg/dL   Triglycerides 178 (H) 0 - 149 mg/dL   HDL 49 >39 mg/dL   VLDL Cholesterol Cal 36 5 - 40 mg/dL   LDL Calculated 95 0 - 99 mg/dL   Chol/HDL Ratio 3.7 0.0 - 4.4 ratio    Comment:                                   T. Chol/HDL Ratio                                             Men  Women                               1/2 Avg.Risk  3.4    3.3                                   Avg.Risk  5.0    4.4                                2X Avg.Risk  9.6    7.1                                3X Avg.Risk 23.4   11.0   VITAMIN D 25 Hydroxy (Vit-D Deficiency, Fractures)     Status: None   Collection Time: 02/22/19 10:39 AM  Result Value Ref Range   Vit D, 25-Hydroxy 40.1 30.0 -  100.0 ng/mL    Comment: Vitamin D deficiency has been defined by the [facility] practice guideline as a level of serum 25-OH vitamin D less than 20 ng/mL (1,2). The Endocrine Society went on to further define vitamin D insufficiency as a level between 21 and 29 ng/mL (2). 1. IOM (Institute of Medicine). 2010. Dietary reference    intakes for calcium and D. [place]: The    [employer]. 2. Holick MF, Binkley , Bischoff-Ferrari HA, et al.    Evaluation, treatment, and prevention of vitamin D    deficiency: an Endocrine Society clinical practice    guideline. JCEM. 2011 Jul; 96(7):1911-30.   CBC     Status: None   Collection Time: 02/22/19  10:39 AM  Result Value Ref Range   WBC 5.7 3.4 - 10.8 x10E3/uL   RBC 4.14 3.77 - 5.28 x10E6/uL   Hemoglobin 12.6 11.1 - 15.9 g/dL   Hematocrit 36.0 34.0 - 46.6 %   MCV 87 79 - 97 fL   MCH 30.4 26.6 - 33.0 pg   MCHC 35.0 31.5 - 35.7 g/dL   RDW 12.6 11.7 - 15.4 %   Platelets 314 150 - 450 x10E3/uL  CMP14+EGFR     Status: Abnormal   Collection Time: 02/22/19 10:39 AM  Result Value Ref Range   Glucose 85 65 - 99 mg/dL   BUN 14 6 - 24 mg/dL   Creatinine, Ser 0.82 0.57 - 1.00 mg/dL   GFR calc non Af Amer 87 >59 mL/min/1.73   GFR calc Af Amer 101 >59 mL/min/1.73   BUN/Creatinine Ratio 17 9 - 23   Sodium 144 134 - 144 mmol/L   Potassium 4.4 3.5 - 5.2 mmol/L   Chloride 100 96 - 106 mmol/L   CO2 24 20 - 29 mmol/L   Calcium 9.8 8.7 - 10.2 mg/dL   Total Protein 6.8 6.0 - 8.5 g/dL   Albumin 4.9 (H) 3.8 - 4.8 g/dL   Globulin, Total 1.9 1.5 - 4.5 g/dL   Albumin/Globulin Ratio 2.6 (H) 1.2 - 2.2   Bilirubin Total 0.3 0.0 - 1.2 mg/dL   Alkaline Phosphatase 106 39 - 117 IU/L   AST 16 0 - 40 IU/L   ALT 16 0 - 32 IU/L   Objective: Office vital signs reviewed. BP 120/70   Pulse 68   Temp 98.6 F (37 C) (Oral)   Ht '5\' 2"'  (1.575 m)   Wt 124 lb (56.2 kg)   BMI 22.68 kg/m   Physical Examination:  General: Awake, alert, well nourished, No acute distress HEENT: Normal, sclera white, MMM Cardio: regular rate and rhythm, S1S2 heard, no murmurs appreciated Pulm: clear to auscultation bilaterally, no wheezes, rhonchi or rales; normal work of breathing on room air GI: soft, +generalized UPPER abdominal TTP. non-distended, bowel sounds present x4, no hepatomegaly, no splenomegaly, no masses Psych: Mood stable, speech normal, affect appropriate.  Patient well-groomed and pleasant. Depression screen [facility] 2/9 02/28/2019 08/16/2018 07/25/2018  Decreased Interest 0 1 1  Down, Depressed, Hopeless 0 1 1  PHQ - 2 Score 0 2 2  Altered sleeping 0 1 1  Tired, decreased energy 0 3 2  Change in appetite 0 0 0   Feeling bad or failure about yourself  0 0 0  Trouble concentrating 0 0 1  Moving slowly or fidgety/restless 0 0 0  Suicidal thoughts 0 0 0  PHQ-9 Score 0 6 6  Difficult doing work/chores - - Not  difficult at all   GAD 7 : Generalized Anxiety Score 02/28/2019  Nervous, Anxious, on Edge 1  Control/stop worrying 1  Worry too much - different things 1  Restless 0  Easily annoyed or irritable 0  Afraid - awful might happen 1   Assessment/ Plan: 43 y.o. female   1. Pain of upper abdomen Uncertain etiology.  Patient has been through quite a few tests and no specific etiology has been found.  I am going to place a referral to a gastroenterologist at an academic center to see if perhaps they can give some insight. - Ambulatory referral to Gastroenterology  2. Generalized anxiety disorder Not well controlled. Seems situational.  I am going to reach out to Dr. Felecia Shelling today.  Perhaps we can consider addition of SSRI.  I would like to make sure this does not interfere with his current work-up and evaluation of her other neurologic abnormalities.  No orders of the defined types were placed in this encounter.  No orders of the defined types were placed in this encounter.  Additionally, we reviewed her laboratory results today.  I believe that her elevated triglycerides are related to nonfasting status.  We will plan to recheck her levels in about 3 months.  Have advised her to come in fasting for that lab.  [name], DO [place] [phone]

## 2019-03-05 ENCOUNTER — Encounter: Payer: Self-pay | Admitting: Family Medicine

## 2019-03-06 ENCOUNTER — Ambulatory Visit (INDEPENDENT_AMBULATORY_CARE_PROVIDER_SITE_OTHER): Payer: 59 | Admitting: Internal Medicine

## 2019-10-07 ENCOUNTER — Other Ambulatory Visit (HOSPITAL_COMMUNITY): Payer: Self-pay | Admitting: Obstetrics & Gynecology

## 2019-10-07 DIAGNOSIS — Z1231 Encounter for screening mammogram for malignant neoplasm of breast: Secondary | ICD-10-CM

## 2019-10-10 ENCOUNTER — Ambulatory Visit (HOSPITAL_COMMUNITY): Payer: 59

## 2019-10-28 ENCOUNTER — Encounter: Payer: Self-pay | Admitting: Obstetrics & Gynecology

## 2019-10-28 ENCOUNTER — Other Ambulatory Visit: Payer: Self-pay

## 2019-10-28 ENCOUNTER — Ambulatory Visit (HOSPITAL_COMMUNITY)
Admission: RE | Admit: 2019-10-28 | Discharge: 2019-10-28 | Disposition: A | Discharge status: Home / Self Care | Payer: 59 | Source: Ambulatory Visit | Attending: Obstetrics & Gynecology | Admitting: Obstetrics & Gynecology

## 2019-10-28 ENCOUNTER — Ambulatory Visit (INDEPENDENT_AMBULATORY_CARE_PROVIDER_SITE_OTHER): Payer: 59 | Admitting: Obstetrics & Gynecology

## 2019-10-28 VITALS — BP 129/84 | HR 85 | Ht 62.0 in | Wt 133.0 lb

## 2019-10-28 DIAGNOSIS — Z1231 Encounter for screening mammogram for malignant neoplasm of breast: Secondary | ICD-10-CM | POA: Insufficient documentation

## 2019-10-28 DIAGNOSIS — Z01419 Encounter for gynecological examination (general) (routine) without abnormal findings: Secondary | ICD-10-CM

## 2019-10-28 DIAGNOSIS — F321 Major depressive disorder, single episode, moderate: Secondary | ICD-10-CM

## 2019-10-28 DIAGNOSIS — Z90711 Acquired absence of uterus with remaining cervical stump: Secondary | ICD-10-CM

## 2019-10-28 MED ORDER — ESCITALOPRAM OXALATE 10 MG PO TABS: 10.0000 mg | ORAL_TABLET | Freq: Every day | ORAL | 1 refills | Status: DC

## 2019-10-28 MED ORDER — ESTRADIOL 0.1 MG/24HR TD PTTW: 1.0000 | MEDICATED_PATCH | TRANSDERMAL | 12 refills | Status: DC

## 2019-10-28 NOTE — Progress Notes (Signed)
Subjective:     Helen Rice is a 44 y.o. female here for a routine exam.  No LMP recorded. Patient has had a hysterectomy. No obstetric history on file. Birth Control Method:  hysterectomy Menstrual Calendar(currently):   Current complaints: .   Current acute medical issues:     Recent Gynecologic History No LMP recorded. Patient has had a hysterectomy. Last Pap: ,   Last mammogram: 11/19,  normal  Past Medical History:  Diagnosis Date  . Fibromyalgia   . GERD (gastroesophageal reflux disease) 05/30/2017  . Lyme disease    being seen by [facility]  . Psoriasis     Past Surgical History:  Procedure Laterality Date  . ABDOMINAL HYSTERECTOMY    . APPENDECTOMY    . CESAREAN SECTION    . CHOLECYSTECTOMY    . COLONOSCOPY     upper/lower  . GIVENS CAPSULE STUDY N/A 06/21/2017   Procedure: GIVENS CAPSULE STUDY;  Surgeon: Rogene Houston, MD;  Location: [facility];  Service: Endoscopy;  Laterality: N/A;    OB History   No obstetric history on file.     Social History   Socioeconomic History  . Marital status: Married    Spouse name: Not on file  . Number of children: 1  . Years of education: GED  . Highest education level: Not on file  Occupational History  . Occupation: [employer]  Tobacco Use  . Smoking status: Former Smoker    Packs/day: 1.50    Years: 15.00    Pack years: 22.50    Quit date: 2016    Years since quitting: 5.4  . Smokeless tobacco: Never Used  Vaping Use  . Vaping Use: Every day  Substance and Sexual Activity  . Alcohol use: Yes    Comment: occ  . Drug use: No  . Sexual activity: Yes    Birth control/protection: Surgical  Other Topics Concern  . Not on file  Social History Narrative   Right handed    Caffeine use: Drinks 1 cup coffee per day   1-3 sodas per day   Lives with husband   Social Determinants of Health   Financial Resource Strain:   . Difficulty of Paying Living Expenses:   Food Insecurity:   . Worried  About Charity fundraiser in the Last Year:   . Arboriculturist in the Last Year:   Transportation Needs:   . Film/video editor (Medical):   Marland Kitchen Lack of Transportation (Non-Medical):   Physical Activity:   . Days of Exercise per Week:   . Minutes of Exercise per Session:   Stress:   . Feeling of Stress :   Social Connections:   . Frequency of Communication with Friends and Family:   . Frequency of Social Gatherings with Friends and Family:   . Attends Religious Services:   . Active Member of Clubs or Organizations:   . Attends Archivist Meetings:   Marland Kitchen Marital Status:     Family History  Problem Relation Age of Onset  . Breast cancer Sister   . Cancer Father   . Psoriasis Father   . Autoimmune disease Father   . Ovarian cancer Paternal Grandmother 21  . Thyroid disease Maternal Aunt   . Heart disease Maternal Grandmother   . Heart disease Maternal Grandfather   . Ovarian cancer Paternal Grandfather      Current Outpatient Medications:  .  Ascorbic Acid (VITAMIN C) 100 MG tablet,  Take 100 mg by mouth daily., Disp: , Rfl:  .  azelastine (ASTELIN) 0.1 % nasal spray, Place 1 spray into both nostrils 2 (two) times daily., Disp: 30 mL, Rfl: 12 .  Cholecalciferol (VITAMIN D) 125 MCG (5000 UT) CAPS, Take by mouth., Disp: , Rfl:  .  conjugated estrogens (PREMARIN) vaginal cream, Place 1 Applicatorful vaginally daily., Disp: 42.5 g, Rfl: 12 .  fluticasone (FLONASE) 50 MCG/ACT nasal spray, Place 2 sprays into both nostrils daily., Disp: , Rfl:  .  omeprazole (PRILOSEC) 20 MG capsule, Take 20mg  PO up to BID prn stomach pain/ reflux, Disp: 60 capsule, Rfl: 0 .  escitalopram (LEXAPRO) 10 MG tablet, Take 1 tablet (10 mg total) by mouth daily., Disp: 30 tablet, Rfl: 1 .  VIVELLE-DOT 0.1 MG/24HR patch, Place 1 patch (0.1 mg total) onto the skin 2 (two) times a week., Disp: 8 patch, Rfl: 12  Review of Systems  Review of Systems  Constitutional: Negative for fever, chills,  weight loss, malaise/fatigue and diaphoresis.  HENT: Negative for hearing loss, ear pain, nosebleeds, congestion, sore throat, neck pain, tinnitus and ear discharge.   Eyes: Negative for blurred vision, double vision, photophobia, pain, discharge and redness.  Respiratory: Negative for cough, hemoptysis, sputum production, shortness of breath, wheezing and stridor.   Cardiovascular: Negative for chest pain, palpitations, orthopnea, claudication, leg swelling and PND.  Gastrointestinal: negative for abdominal pain. Negative for heartburn, nausea, vomiting, diarrhea, constipation, blood in stool and melena.  Genitourinary: Negative for dysuria, urgency, frequency, hematuria and flank pain.  Musculoskeletal: Negative for myalgias, back pain, joint pain and falls.  Skin: Negative for itching and rash.  Neurological: Negative for dizziness, tingling, tremors, sensory change, speech change, focal weakness, seizures, loss of consciousness, weakness and headaches.  Endo/Heme/Allergies: Negative for environmental allergies and polydipsia. Does not bruise/bleed easily.  Psychiatric/Behavioral: Negative for depression, suicidal ideas, hallucinations, memory loss and substance abuse. The patient is not nervous/anxious and does not have insomnia.        Objective:  Blood pressure 129/84, pulse 85, height 5\' 2"  (1.575 m), weight 133 lb (60.3 kg).   Physical Exam  Vitals reviewed. Constitutional: She is oriented to person, place, and time. She appears well-developed and well-nourished.  HENT:  Head: Normocephalic and atraumatic.        Right Ear: External ear normal.  Left Ear: External ear normal.  Nose: Nose normal.  Mouth/Throat: Oropharynx is clear and moist.  Eyes: Conjunctivae and EOM are normal. Pupils are equal, round, and reactive to light. Right eye exhibits no discharge. Left eye exhibits no discharge. No scleral icterus.  Neck: Normal range of motion. Neck supple. No tracheal deviation  present. No thyromegaly present.  Cardiovascular: Normal rate, regular rhythm, normal heart sounds and intact distal pulses.  Exam reveals no gallop and no friction rub.   No murmur heard. Respiratory: Effort normal and breath sounds normal. No respiratory distress. She has no wheezes. She has no rales. She exhibits no tenderness.  GI: Soft. Bowel sounds are normal. She exhibits no distension and no mass. There is no tenderness. There is no rebound and no guarding.  Genitourinary:  Breasts no masses skin changes or nipple changes bilaterally      Vulva is normal without lesions Vagina is pink moist without discharge Cervix absent Uterus is absent Adnexa is negative with normal sized ovaries   Musculoskeletal: Normal range of motion. She exhibits no edema and no tenderness.  Neurological: She is alert and oriented to person, place, and  time. She has normal reflexes. She displays normal reflexes. No cranial nerve deficit. She exhibits normal muscle tone. Coordination normal.  Skin: Skin is warm and dry. No rash noted. No erythema. No pallor.  Psychiatric: She has a normal mood and affect. Her behavior is normal. Judgment and thought content normal.       Medications Ordered at today's visit: Meds ordered this encounter  Medications  . escitalopram (LEXAPRO) 10 MG tablet    Sig: Take 1 tablet (10 mg total) by mouth daily.    Dispense:  30 tablet    Refill:  1  . DISCONTD: estradiol (VIVELLE-DOT) 0.1 MG/24HR patch    Sig: Place 1 patch (0.1 mg total) onto the skin 2 (two) times a week.    Dispense:  8 patch    Refill:  12    Other orders placed at today's visit: No orders of the defined types were placed in this encounter.     Assessment:    Healthy female exam.   Menopausal symptoms Plan:    trial of lexapro and topical estrogen  Return in about 6 weeks (around 12/09/2019) for Follow up, with Dr Elonda Husky.

## 2019-11-11 ENCOUNTER — Encounter: Payer: Self-pay | Admitting: Family Medicine

## 2019-12-09 ENCOUNTER — Ambulatory Visit: Payer: 59 | Admitting: Obstetrics & Gynecology

## 2019-12-25 ENCOUNTER — Other Ambulatory Visit: Payer: Self-pay | Admitting: Obstetrics & Gynecology

## 2019-12-25 MED ORDER — VIVELLE-DOT 0.1 MG/24HR TD PTTW: 1.0000 | MEDICATED_PATCH | TRANSDERMAL | 12 refills | Status: DC

## 2020-01-29 ENCOUNTER — Other Ambulatory Visit: Payer: Self-pay

## 2020-01-29 ENCOUNTER — Encounter: Payer: Self-pay | Admitting: Family Medicine

## 2020-01-29 ENCOUNTER — Ambulatory Visit (INDEPENDENT_AMBULATORY_CARE_PROVIDER_SITE_OTHER): Payer: 59 | Admitting: Family Medicine

## 2020-01-29 ENCOUNTER — Other Ambulatory Visit: Payer: Self-pay | Admitting: Family Medicine

## 2020-01-29 ENCOUNTER — Ambulatory Visit (INDEPENDENT_AMBULATORY_CARE_PROVIDER_SITE_OTHER): Payer: 59

## 2020-01-29 VITALS — BP 124/78 | HR 80 | Temp 98.2°F | Ht 62.0 in | Wt 136.6 lb

## 2020-01-29 DIAGNOSIS — M25552 Pain in left hip: Secondary | ICD-10-CM

## 2020-01-29 DIAGNOSIS — M25461 Effusion, right knee: Secondary | ICD-10-CM

## 2020-01-29 DIAGNOSIS — M25561 Pain in right knee: Secondary | ICD-10-CM

## 2020-01-29 DIAGNOSIS — E559 Vitamin D deficiency, unspecified: Secondary | ICD-10-CM | POA: Diagnosis not present

## 2020-01-29 DIAGNOSIS — M7711 Lateral epicondylitis, right elbow: Secondary | ICD-10-CM | POA: Diagnosis not present

## 2020-01-29 DIAGNOSIS — K760 Fatty (change of) liver, not elsewhere classified: Secondary | ICD-10-CM

## 2020-01-29 MED ORDER — DICLOFENAC SODIUM 75 MG PO TBEC: 75.0000 mg | DELAYED_RELEASE_TABLET | Freq: Two times a day (BID) | ORAL | 1 refills | Status: DC | PRN

## 2020-01-29 NOTE — Patient Instructions (Addendum)
Space the Lexapro and Diclofenac by 3 hours (together they increase RISK of stomach bleeds/ acid reflux)  You have prescribed a nonsteroidal anti-inflammatory drug (NSAID) today. This will help with your pain and inflammation. Please do not take any other NSAIDs (ibuprofen/Motrin/Advil, naproxen/Aleve, meloxicam/Mobic, Voltaren/diclofenac). Please make sure to eat a meal when taking this medication.   Caution:  If you have a history of acid reflux/indigestion, I recommend that you take an antacid (such as Prilosec, Prevacid) daily while on the NSAID.  If you have a history of bleeding disorder, gastric ulcer, are on a blood thinner (like warfarin/Coumadin, Xarelto, Eliquis, etc) please do not take NSAID.  If you have ever had a heart attack, you should not take NSAIDs.  Get a tennis elbow brace from the pharmacy.    Use ice on the painful joints.  Use a knee brace to reduce swelling.

## 2020-01-29 NOTE — Progress Notes (Signed)
Subjective: CC: Knee pain, elbow pain PCP: [name], DO MOL:MBEM A Helen Rice is a 44 y.o. female presenting to clinic today for:  1.  Knee pain Patient reports that she developed right-sided knee pain with subsequent swelling that started at the top of her knee and went down to mid calf.  She has been ongoing for several weeks now.  Symptoms seem to be exacerbated by sitting for prolonged periods or walking.  She has been trying to increase her physical activity to promote weight loss.  She describes the swelling as edema, which also occurs in the left leg but to a lesser extent.  She has been modifying her diet, reducing her salt but again symptoms continue to recur.  No history of previous surgery.  She did have a fall several years ago on that knee.  No known fracture or intervention were needed.  She also notes that she started having a left-sided hip pain now.  2.  Elbow pain Patient reports several week history of right sided lateral elbow pain.  She notes that she is right-hand dominant.  She works quite a bit on Cyt[employer] and has been doing more with baking.  She notes that pain is less today but has been quite debilitating other times.  She is also having some medial elbow pain that started a few weeks after the lateral elbow pain.  Sometimes she cannot even use her right upper extremity secondary to pain.   ROS: Per HPI  Allergies  Allergen Reactions  . Influenza Vaccines     Cannot take  . Latex     rash  . Sulfur Rash   Past Medical History:  Diagnosis Date  . Fibromyalgia   . GERD (gastroesophageal reflux disease) 05/30/2017  . Lyme disease    being seen by [facility]  . Psoriasis     Current Outpatient Medications:  .  Ascorbic Acid (VITAMIN C) 100 MG tablet, Take 100 mg by mouth daily., Disp: , Rfl:  .  azelastine (ASTELIN) 0.1 % nasal spray, Place 1 spray into both nostrils 2 (two) times daily., Disp: 30 mL, Rfl: 12 .  Cholecalciferol  (VITAMIN D) 125 MCG (5000 UT) CAPS, Take by mouth., Disp: , Rfl:  .  conjugated estrogens (PREMARIN) vaginal cream, Place 1 Applicatorful vaginally daily., Disp: 42.5 g, Rfl: 12 .  escitalopram (LEXAPRO) 10 MG tablet, Take 1 tablet (10 mg total) by mouth daily., Disp: 30 tablet, Rfl: 1 .  fluticasone (FLONASE) 50 MCG/ACT nasal spray, Place 2 sprays into both nostrils daily., Disp: , Rfl:  .  omeprazole (PRILOSEC) 20 MG capsule, Take 53m PO up to BID prn stomach pain/ reflux, Disp: 60 capsule, Rfl: 0 .  VIVELLE-DOT 0.1 MG/24HR patch, Place 1 patch (0.1 mg total) onto the skin 2 (two) times a week., Disp: 8 patch, Rfl: 12 Social History   Socioeconomic History  . Marital status: Married    Spouse name: Not on file  . Number of children: 1  . Years of education: GED  . Highest education level: Not on file  Occupational History  . Occupation: [employer]  Tobacco Use  . Smoking status: Former Smoker    Packs/day: 1.50    Years: 15.00    Pack years: 22.50    Quit date: 2016    Years since quitting: 5.4  . Smokeless tobacco: Never Used  Vaping Use  . Vaping Use: Every day  Substance and Sexual Activity  . Alcohol use: Yes  Comment: occ  . Drug use: No  . Sexual activity: Yes    Birth control/protection: Surgical  Other Topics Concern  . Not on file  Social History Narrative   Right handed    Caffeine use: Drinks 1 cup coffee per day   1-3 sodas per day   Lives with husband   Social Determinants of Health   Financial Resource Strain:   . Difficulty of Paying Living Expenses:   Food Insecurity:   . Worried About Charity fundraiser in the Last Year:   . Arboriculturist in the Last Year:   Transportation Needs:   . Film/video editor (Medical):   Marland Kitchen Lack of Transportation (Non-Medical):   Physical Activity:   . Days of Exercise per Week:   . Minutes of Exercise per Session:   Stress:   . Feeling of Stress :   Social Connections:   . Frequency of Communication with Friends  and Family:   . Frequency of Social Gatherings with Friends and Family:   . Attends Religious Services:   . Active Member of Clubs or Organizations:   . Attends Archivist Meetings:   Marland Kitchen Marital Status:   Intimate Partner Violence:   . Fear of Current or Ex-Partner:   . Emotionally Abused:   Marland Kitchen Physically Abused:   . Sexually Abused:    Family History  Problem Relation Age of Onset  . Breast cancer Sister   . Cancer Father   . Psoriasis Father   . Autoimmune disease Father   . Ovarian cancer Paternal Grandmother 60  . Thyroid disease Maternal Aunt   . Heart disease Maternal Grandmother   . Heart disease Maternal Grandfather   . Ovarian cancer Paternal Grandfather     Objective: Office vital signs reviewed. BP 124/78   Pulse 80   Temp 98.2 F (36.8 C)   Ht _0  (1.575 m)   Wt 136 lb 9.6 oz (62 kg)   SpO2 99%   BMI 24.98 kg/m   Physical Examination:  General: Awake, alert, well nourished, No acute distress HEENT: Normal, sclera white, MMM Cardio: regular rate and rhythm, S1S2 heard, no murmurs appreciated Pulm: clear to auscultation bilaterally, no wheezes, rhonchi or rales; normal work of breathing on room air Extremities: warm, well perfused, No edema, cyanosis or clubbing; +2 pulses bilaterally MSK: normal gait and station  Right knee: She does have a visible joint effusion noted on the right knee.  There is no significant soft tissue swelling or edema that I appreciate on exam.  She has fairly preserved active range of motion except for in flexion where she has a mild reduction compared to the left.  No ligamentous laxity.  No tenderness to palpation to the patella, joint line.  She has mild tenderness palpation to the posterior popliteal fossa.  Right elbow: She has exquisite tenderness to palpation along the lateral epicondyle.  She also is tender along the proximal aspect of the brachioradialis. No visible or palpable deformities  No results  found.  Assessment/ Plan: 44 y.o. female   1. Lateral epicondylitis of right elbow Start NSAID, Caution with SSRI.  Ice, brace.  - diclofenac (VOLTAREN) 75 MG EC tablet; Take 1 tablet (75 mg total) by mouth 2 (two) times daily as needed for moderate pain (take with food and plenty of water).  Dispense: 60 tablet; Refill: 1  2. Pain and swelling of right knee ?meniscal injury?  Check plain films.  Referral to  [facility] in [place] - diclofenac (VOLTAREN) 75 MG EC tablet; Take 1 tablet (75 mg total) by mouth 2 (two) times daily as needed for moderate pain (take with food and plenty of water).  Dispense: 60 tablet; Refill: 1 - CBC with Differential; Future  3. Left hip pain Likely secondary to the above. - diclofenac (VOLTAREN) 75 MG EC tablet; Take 1 tablet (75 mg total) by mouth 2 (two) times daily as needed for moderate pain (take with food and plenty of water).  Dispense: 60 tablet; Refill: 1  4. Vitamin D deficiency Coming in for fasting labs  - VITAMIN D 25 Hydroxy (Vit-D Deficiency, Fractures); Future  5. Fatty liver - CMP14+EGFR; Future - Lipid panel; Future   No orders of the defined types were placed in this encounter.  No orders of the defined types were placed in this encounter.    [name], DO [facility] [phone]

## 2020-02-26 ENCOUNTER — Encounter: Payer: Self-pay | Admitting: Family Medicine

## 2020-06-23 ENCOUNTER — Encounter: Payer: Self-pay | Admitting: Family Medicine

## 2020-06-23 ENCOUNTER — Other Ambulatory Visit: Payer: Self-pay | Admitting: Family Medicine

## 2020-06-23 DIAGNOSIS — E782 Mixed hyperlipidemia: Secondary | ICD-10-CM

## 2020-06-25 ENCOUNTER — Other Ambulatory Visit: Payer: 59

## 2020-06-25 ENCOUNTER — Other Ambulatory Visit: Payer: Self-pay

## 2020-06-25 DIAGNOSIS — E782 Mixed hyperlipidemia: Secondary | ICD-10-CM

## 2020-06-25 DIAGNOSIS — M25561 Pain in right knee: Secondary | ICD-10-CM

## 2020-06-25 DIAGNOSIS — E559 Vitamin D deficiency, unspecified: Secondary | ICD-10-CM

## 2020-06-25 DIAGNOSIS — M25461 Effusion, right knee: Secondary | ICD-10-CM

## 2020-06-26 LAB — LIPID PANEL
Chol/HDL Ratio: 5 ratio — ABNORMAL HIGH (ref 0.0–4.4)
Cholesterol, Total: 224 mg/dL — ABNORMAL HIGH (ref 100–199)
HDL: 45 mg/dL (ref >39)
LDL Chol Calc (NIH): 143 mg/dL — ABNORMAL HIGH (ref 0–99)
Triglycerides: 200 mg/dL — ABNORMAL HIGH (ref 0–149)
VLDL Cholesterol Cal: 36 mg/dL (ref 5–40)

## 2020-06-26 LAB — CBC WITH DIFFERENTIAL/PLATELET
Basophils Absolute: 0 10*3/uL (ref 0.0–0.2)
Basos: 1 %
EOS (ABSOLUTE): 0.1 10*3/uL (ref 0.0–0.4)
Eos: 3 %
Hematocrit: 39.6 % (ref 34.0–46.6)
Hemoglobin: 13.4 g/dL (ref 11.1–15.9)
Immature Grans (Abs): 0 10*3/uL (ref 0.0–0.1)
Immature Granulocytes: 1 %
Lymphocytes Absolute: 1.3 10*3/uL (ref 0.7–3.1)
Lymphs: 30 %
MCH: 30.3 pg (ref 26.6–33.0)
MCHC: 33.8 g/dL (ref 31.5–35.7)
MCV: 90 fL (ref 79–97)
Monocytes Absolute: 0.3 10*3/uL (ref 0.1–0.9)
Monocytes: 7 %
Neutrophils Absolute: 2.6 10*3/uL (ref 1.4–7.0)
Neutrophils: 58 %
Platelets: 310 10*3/uL (ref 150–450)
RBC: 4.42 x10E6/uL (ref 3.77–5.28)
RDW: 12.6 % (ref 11.7–15.4)
WBC: 4.4 10*3/uL (ref 3.4–10.8)

## 2020-06-26 LAB — CMP14+EGFR
ALT: 36 IU/L — ABNORMAL HIGH (ref 0–32)
AST: 29 IU/L (ref 0–40)
Albumin/Globulin Ratio: 2.5 — ABNORMAL HIGH (ref 1.2–2.2)
Albumin: 4.9 g/dL — ABNORMAL HIGH (ref 3.8–4.8)
Alkaline Phosphatase: 149 IU/L — ABNORMAL HIGH (ref 44–121)
BUN/Creatinine Ratio: 21 (ref 9–23)
BUN: 16 mg/dL (ref 6–24)
Bilirubin Total: 0.3 mg/dL (ref 0.0–1.2)
CO2: 25 mmol/L (ref 20–29)
Calcium: 10 mg/dL (ref 8.7–10.2)
Chloride: 106 mmol/L (ref 96–106)
Creatinine, Ser: 0.77 mg/dL (ref 0.57–1.00)
GFR calc Af Amer: 108 mL/min/{1.73_m2} (ref >59)
GFR calc non Af Amer: 94 mL/min/{1.73_m2} (ref >59)
Globulin, Total: 2 g/dL (ref 1.5–4.5)
Glucose: 96 mg/dL (ref 65–99)
Potassium: 4.2 mmol/L (ref 3.5–5.2)
Sodium: 146 mmol/L — ABNORMAL HIGH (ref 134–144)
Total Protein: 6.9 g/dL (ref 6.0–8.5)

## 2020-06-26 LAB — THYROID PANEL WITH TSH
Free Thyroxine Index: 1.5 (ref 1.2–4.9)
T3 Uptake Ratio: 20 % — ABNORMAL LOW (ref 24–39)
T4, Total: 7.3 ug/dL (ref 4.5–12.0)
TSH: 2.04 u[IU]/mL (ref 0.450–4.500)

## 2020-06-26 LAB — VITAMIN D 25 HYDROXY (VIT D DEFICIENCY, FRACTURES): Vit D, 25-Hydroxy: 71 ng/mL (ref 30.0–100.0)

## 2020-08-10 ENCOUNTER — Encounter: Payer: Self-pay | Admitting: Family Medicine

## 2020-08-12 ENCOUNTER — Ambulatory Visit: Payer: 59 | Admitting: Family Medicine

## 2020-08-14 ENCOUNTER — Encounter: Payer: Self-pay | Admitting: Family Medicine

## 2020-10-07 ENCOUNTER — Encounter: Payer: Self-pay | Admitting: Family Medicine

## 2020-10-16 ENCOUNTER — Encounter: Payer: Self-pay | Admitting: Family Medicine

## 2020-11-17 ENCOUNTER — Other Ambulatory Visit: Payer: Self-pay

## 2020-11-17 ENCOUNTER — Ambulatory Visit (INDEPENDENT_AMBULATORY_CARE_PROVIDER_SITE_OTHER): Payer: No Typology Code available for payment source | Admitting: Family Medicine

## 2020-11-17 ENCOUNTER — Encounter: Payer: Self-pay | Admitting: Family Medicine

## 2020-11-17 VITALS — BP 119/78 | HR 74 | Temp 97.7°F | Ht 62.0 in | Wt 125.2 lb

## 2020-11-17 DIAGNOSIS — M7121 Synovial cyst of popliteal space [Baker], right knee: Secondary | ICD-10-CM | POA: Diagnosis not present

## 2020-11-17 DIAGNOSIS — E782 Mixed hyperlipidemia: Secondary | ICD-10-CM | POA: Diagnosis not present

## 2020-11-17 LAB — LIPID PANEL
Chol/HDL Ratio: 4 ratio (ref 0.0–4.4)
Cholesterol, Total: 196 mg/dL (ref 100–199)
HDL: 49 mg/dL (ref >39)
LDL Chol Calc (NIH): 118 mg/dL — ABNORMAL HIGH (ref 0–99)
Triglycerides: 165 mg/dL — ABNORMAL HIGH (ref 0–149)
VLDL Cholesterol Cal: 29 mg/dL (ref 5–40)

## 2020-11-17 LAB — CMP14+EGFR
ALT: 17 IU/L (ref 0–32)
AST: 18 IU/L (ref 0–40)
Albumin/Globulin Ratio: 2.1 (ref 1.2–2.2)
Albumin: 5.1 g/dL — ABNORMAL HIGH (ref 3.8–4.8)
Alkaline Phosphatase: 104 IU/L (ref 44–121)
BUN/Creatinine Ratio: 15 (ref 9–23)
BUN: 13 mg/dL (ref 6–24)
Bilirubin Total: 0.4 mg/dL (ref 0.0–1.2)
CO2: 23 mmol/L (ref 20–29)
Calcium: 10 mg/dL (ref 8.7–10.2)
Chloride: 104 mmol/L (ref 96–106)
Creatinine, Ser: 0.84 mg/dL (ref 0.57–1.00)
Globulin, Total: 2.4 g/dL (ref 1.5–4.5)
Glucose: 86 mg/dL (ref 65–99)
Potassium: 4.4 mmol/L (ref 3.5–5.2)
Sodium: 143 mmol/L (ref 134–144)
Total Protein: 7.5 g/dL (ref 6.0–8.5)
eGFR: 87 mL/min/{1.73_m2} (ref >59)

## 2020-11-17 NOTE — Progress Notes (Signed)
Subjective: CC: Knee pain PCP: Helen Norlander, DO GLO:Helen Rice is a 45 y.o. female presenting to clinic today for:  1.  Knee fullness Patient reports that she used to have swelling around the anterior right knee but as of late the swelling has gone to the posterior right knee.  She notices this sensation with walking.  It feels slightly uncomfortable but mostly unusual.  Denies any instability the knee or preceding injury.  She is not been using any compression on the knee because she does not often remember.  No oral NSAIDs.  She was running up to 3 miles per day and continues to try and stay physically active to reduce her cardiovascular risk.  2.  Hyperlipidemia Patient really has been working on weight loss, improving physical activity.  Her LDL had come down to about 112 on her most recent labs gotten by her work but her HDL was 16, a drop from 46.  She is not on any cholesterol-lowering medications.  She had an eye exam done recently and apparently there was evidence of cholesterol in her eye exam.  She worries about her cardiovascular risk given her family history.  She had a sip of coffee with creamer today but nothing else   ROS: Per HPI  Allergies  Allergen Reactions  . Influenza Vaccines     Cannot take  . Latex     rash  . Elemental Sulfur Rash   Past Medical History:  Diagnosis Date  . Fibromyalgia   . GERD (gastroesophageal reflux disease) 05/30/2017  . Lyme disease    being seen by [facility]  . Psoriasis     Current Outpatient Medications:  .  Ascorbic Acid (VITAMIN C) 100 MG tablet, Take 100 mg by mouth daily., Disp: , Rfl:  .  azelastine (ASTELIN) 0.1 % nasal spray, Place 1 spray into both nostrils 2 (two) times daily., Disp: 30 mL, Rfl: 12 .  Cholecalciferol (VITAMIN D) 125 MCG (5000 UT) CAPS, Take by mouth., Disp: , Rfl:  .  conjugated estrogens (PREMARIN) vaginal cream, Place 1 Applicatorful vaginally daily., Disp: 42.5 g, Rfl:  12 .  diclofenac (VOLTAREN) 75 MG EC tablet, Take 1 tablet (75 mg total) by mouth 2 (two) times daily as needed for moderate pain (take with food and plenty of water)., Disp: 60 tablet, Rfl: 1 .  escitalopram (LEXAPRO) 10 MG tablet, Take 1 tablet (10 mg total) by mouth daily., Disp: 30 tablet, Rfl: 1 .  fluticasone (FLONASE) 50 MCG/ACT nasal spray, Place 2 sprays into both nostrils daily., Disp: , Rfl:  .  omeprazole (PRILOSEC) 20 MG capsule, Take 53m PO up to BID prn stomach pain/ reflux, Disp: 60 capsule, Rfl: 0 .  VIVELLE-DOT 0.1 MG/24HR patch, Place 1 patch (0.1 mg total) onto the skin 2 (two) times a week., Disp: 8 patch, Rfl: 12 Social History   Socioeconomic History  . Marital status: Married    Spouse name: Not on file  . Number of children: 1  . Years of education: GED  . Highest education level: Not on file  Occupational History  . Occupation: [employer]  Tobacco Use  . Smoking status: Former Smoker    Packs/day: 1.50    Years: 15.00    Pack years: 22.50    Quit date: 2016    Years since quitting: 6.2  . Smokeless tobacco: Never Used  Vaping Use  . Vaping Use: Every day  Substance and Sexual Activity  . Alcohol use:  Yes    Comment: occ  . Drug use: No  . Sexual activity: Yes    Birth control/protection: Surgical  Other Topics Concern  . Not on file  Social History Narrative   Right handed    Caffeine use: Drinks 1 cup coffee per day   1-3 sodas per day   Lives with husband   Social Determinants of Health   Financial Resource Strain: Not on file  Food Insecurity: Not on file  Transportation Needs: Not on file  Physical Activity: Not on file  Stress: Not on file  Social Connections: Not on file  Intimate Partner Violence: Not on file   Family History  Problem Relation Age of Onset  . Breast cancer Sister   . Cancer Father   . Psoriasis Father   . Autoimmune disease Father   . Ovarian cancer Paternal Grandmother 40  . Thyroid disease Maternal Aunt   . Heart  disease Maternal Grandmother   . Heart disease Maternal Grandfather   . Ovarian cancer Paternal Grandfather     Objective: Office vital signs reviewed. BP 119/78   Pulse 74   Temp 97.7 F (36.5 C)   Ht '5\' 2"'  (1.575 m)   Wt 125 lb 3.2 oz (56.8 kg)   SpO2 97%   BMI 22.90 kg/m   Physical Examination:  General: Awake, alert, well nourished, No acute distress Cardio: regular rate and rhythm, S1S2 heard, no murmurs appreciated Pulm: clear to auscultation bilaterally, no wheezes, rhonchi or rales; normal work of breathing on room air Extremities: warm, well perfused, No edema, cyanosis or clubbing; +2 pulses bilaterally MSK: Normal gait and station.  Ambulating independently  Right knee: No gross joint deformity, soft tissue swelling or effusion.  She does have a palpable fullness consistent with a Baker's cyst behind the right knee.  She has apparent full flexion and extension.  No tenderness to palpation to the joint line.  No ligamentous laxity  Assessment/ Plan: 45 y.o. female   Mixed hyperlipidemia - Plan: Lipid panel, CMP14+EGFR  Synovial cyst of right popliteal space  Check lipid panel, CMP again.  I find it highly unusual that her HDL would drop so far given improvement in physical activity and diet.  I wonder if this is perhaps erroneous.  If she has ongoing elevation in her cholesterol, we should strongly consider low-dose statin to improve these levels.  I suspect that she has a Baker's cyst in the right popliteal space.  We discussed compression, use of NSAIDs.  If persistent or worsening would consider referral to sports medicine center in [place] for ultrasound and possible aspiration.  At this point I think this is a mild change.  No orders of the defined types were placed in this encounter.  No orders of the defined types were placed in this encounter.    Helen Norlander, DO [place] [phone]

## 2020-11-17 NOTE — Patient Instructions (Signed)
Baker Cyst  A Baker cyst, also called a popliteal cyst, is a growth that forms at the back of the knee. The cyst forms when the fluid-filled sac (bursa) that cushions the knee joint becomes enlarged. What are the causes? In most cases, a Baker cyst results from another knee problem that causes swelling inside the knee. This makes the fluid inside the knee joint (synovial fluid) flow into the bursa behind the knee, causing the bursa to enlarge. What increases the risk? You may be more likely to develop a Baker cyst if you already have a knee problem, such as:  A tear in cartilage that cushions the knee joint (meniscal tear).  A tear in the tissues that connect the bones of the knee joint (ligament tear).  Knee swelling from osteoarthritis, rheumatoid arthritis, or gout. What are the signs or symptoms? The main symptom of this condition is a lump behind the knee. This may be the only symptom of the condition. The lump may be painful, especially when the knee is straightened. If the lump is painful, the pain may come and go. The knee may also be stiff. Symptoms may quickly get more severe if the cyst breaks open (ruptures). If the cyst ruptures, you may feel the following in your knee and calf:  Sudden or worsening pain.  Swelling.  Bruising.  Redness in the calf. A Baker cyst does not always cause symptoms. How is this diagnosed? This condition may be diagnosed based on your symptoms and medical history. Your health care provider will also do a physical exam. This may include:  Feeling the cyst to check whether it is tender.  Checking your knee for signs of another knee condition that causes swelling. You may have imaging tests, such as:  X-rays.  MRI.  Ultrasound. How is this treated? A Baker cyst that is not painful may go away without treatment. If the cyst gets large or painful, it will likely get better if the underlying knee problem is treated. If needed, treatment for a  Baker cyst may include:  Resting.  Keeping weight off of the knee. This means not leaning on the knee to support your body weight.  Taking NSAIDs, such as ibuprofen, to reduce pain and swelling.  Having a procedure to drain the fluid from the cyst with a needle (aspiration). You may also get an injection of a medicine that reduces swelling (steroid).  Having surgery. This may be needed if other treatments do not work. This usually involves correcting knee damage and removing the cyst. Follow these instructions at home: Activity  Rest as told by your health care provider.  Avoid activities that make pain or swelling worse.  Return to your normal activities as told by your health care provider. Ask your health care provider what activities are safe for you.  Do not use the injured limb to support your body weight until your health care provider says that you can. Use crutches as told by your health care provider. General instructions  Take over-the-counter and prescription medicines only as told by your health care provider.  Keep all follow-up visits as told by your health care provider. This is important.   Contact a health care provider if:  You have knee pain, stiffness, or swelling that does not get better. Get help right away if:  You have sudden or worsening pain and swelling in your calf area. Summary  A Baker cyst, also called a popliteal cyst, is a growth that forms at   the back of the knee.  In most cases, a Baker cyst results from another knee problem that causes swelling inside the knee.  A Baker cyst that is not painful may go away without treatment.  If needed, treatment for a Baker cyst may include resting, keeping weight off of the knee, medicines, or draining fluid from the cyst.  Surgery may be needed if other treatments are not effective. This information is not intended to replace advice given to you by your health care provider. Make sure you discuss any  questions you have with your health care provider. Document Revised: 12/07/2018 Document Reviewed: 12/07/2018 Elsevier Patient Education  2021 [employer]vier Inc.  

## 2020-11-18 ENCOUNTER — Encounter: Payer: Self-pay | Admitting: Family Medicine

## 2021-01-07 ENCOUNTER — Encounter: Payer: Self-pay | Admitting: Family Medicine

## 2021-01-08 ENCOUNTER — Other Ambulatory Visit: Payer: Self-pay | Admitting: Family

## 2021-01-08 DIAGNOSIS — M7120 Synovial cyst of popliteal space [Baker], unspecified knee: Secondary | ICD-10-CM

## 2021-01-11 ENCOUNTER — Telehealth: Payer: Self-pay | Admitting: Obstetrics & Gynecology

## 2021-01-11 ENCOUNTER — Other Ambulatory Visit: Payer: Self-pay | Admitting: Obstetrics & Gynecology

## 2021-01-11 MED ORDER — ESTROGENS, CONJUGATED 0.625 MG/GM VA CREA: 1.0000 | TOPICAL_CREAM | Freq: Every day | VAGINAL | 12 refills | Status: AC

## 2021-01-11 NOTE — Telephone Encounter (Signed)
Patient called stating that [facility] in [place] sent over a refill of one of her medication that Dr. Elonda Husky prescribed her, but they need the other one transferred to them so they can fill it as well. I informed patient that Dr. Elonda Husky will not be in the office again until Friday patient verbalized understanding. Please contact pt when Dr. Elonda Husky has send the mediation to her [facility] in [place]

## 2021-01-13 ENCOUNTER — Telehealth: Payer: Self-pay | Admitting: Obstetrics & Gynecology

## 2021-01-13 NOTE — Telephone Encounter (Signed)
Called pharmacy to clarify med orders. Adjusted.

## 2021-01-13 NOTE — Telephone Encounter (Signed)
Please call pharmacy & clarify    VIVELLE-DOT 0.1 MG/24HR patch - insurance will only cover generic, need "OK" to change to generic   conjugated estrogens (PREMARIN) vaginal cream - need to clarify directions for "once daily" (states usually it's daily for a few days then a couple times a week)    Please call Gabriel Cirri [phone] @ [facility]

## 2021-01-14 ENCOUNTER — Ambulatory Visit (INDEPENDENT_AMBULATORY_CARE_PROVIDER_SITE_OTHER): Payer: No Typology Code available for payment source | Admitting: Sports Medicine

## 2021-01-14 ENCOUNTER — Encounter: Payer: Self-pay | Admitting: Sports Medicine

## 2021-01-14 ENCOUNTER — Other Ambulatory Visit: Payer: Self-pay

## 2021-01-14 VITALS — BP 132/82 | Ht 62.0 in | Wt 134.0 lb

## 2021-01-14 DIAGNOSIS — G8929 Other chronic pain: Secondary | ICD-10-CM | POA: Diagnosis not present

## 2021-01-14 DIAGNOSIS — M25561 Pain in right knee: Secondary | ICD-10-CM

## 2021-01-14 MED ORDER — METHYLPREDNISOLONE ACETATE 40 MG/ML IJ SUSP
40.0000 mg | Freq: Once | INTRAMUSCULAR | Status: AC
  Administered 2021-01-14: 40 mg via INTRA_ARTICULAR

## 2021-01-14 NOTE — Patient Instructions (Signed)
You had an injection today. Things to be aware of after injection are listed below:   . You may experience no significant improvement or even a slight worsening in your symptoms during the first 24 to 48 hours. After that we expect your symptoms to improve gradually over the next 2 weeks for the medicine to have its maximal effect. You should continue to have improvement out to 6 weeks after your injection. . We recommend icing the site of the injection for 20 minutes 1-2 times the day of your injection and as needed for pain over the following several days. . You may shower but no swimming, tub bath or Jacuzzi for 24 hours. . If your bandage falls off this does not need to be replaced. It is appropriate to remove the bandage after 4 hours. . You may resume light activities as tolerated.  It can take several weeks to see improvements following injection. If after 2 weeks you are continuing to have worsening symptoms, please call our office to discuss what the next appropriate actions should be including the potential for a return office visit or other diagnostic testing.  POSSIBLE PROCEDURE SIDE EFFECTS: The side effects of the injection are usually minimal, self-limited, and usually will resolve on their own. Common side effects that can occur over the first several days following injection include:  . Increased numbness or tingling . Worsening pain, stiffness, or slight weakness . Swelling or bruising at the injection site  If you are concerned, please feel free to contact the office with questions.   Please call our office immediately if you experience any of the following symptoms over the next 2 weeks as these can be signs of infection:  . Fever greater than 100.5F . Significant swelling at the injection site . Significant redness or drainage from the injection site  

## 2021-01-14 NOTE — Progress Notes (Signed)
Office Visit Note   Patient: Helen Rice           Date of Birth: 06/29/1976           MRN: [id number] Visit Date: 01/14/2021 Requested by: Sharion Balloon, [place],  [place] [address] PCP: [name], DO  Subjective: CC: Right knee pain  HPI: 45 year old female presenting to clinic with acute on chronic right knee pain.  Patient states that she was told she had high cholesterol, so she started to walk daily for exercise.  Thinks that this activity, she was able to lose approximately 20 pounds.  Unfortunately, approximately 1 year ago while on a long walk she started to notice an aching sensation and swelling in her right knee.  She saw her primary care provider who gave her oral diclofenac, and says that this kept her pain steady for many months.  Unfortunately, approximately 6 months ago she was on another walk and started to notice pain in the back of her knee accompanied by swelling.  She describes "a huge protuberance in the back of my knee."  She says this was significantly painful, and she felt as though the pain is radiating down her leg. She tried the previously prescribed antiinflammatories, with minimal improvement, as well as a compression sleeve. She states her knee has only worsened, and she is 'barely able to walk at all.' Endorses a 'popping' and 'locking' sensation with walking, which is very painful. She says even normal strolls are causing her knee to swell now, when previously it was only her long walks. She has now had to abandon her longer walks, and has subsequently gained ten pounds, which she is very frustrated about.  She also states that over the past few weeks she has noticed an aching in her knee with prolonged sitting as well as walking.  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  She has tried elevating her leg which helps minimally.               Objective: Vital Signs: BP 132/82   Ht  5\' 2"  (1.575 m)   Wt 134 lb (60.8 kg)   BMI 24.51 kg/m  No flowsheet data found.   No flowsheet data found.  Physical Exam:  General:  Alert and oriented, in no acute distress. Pulm:  Breathing unlabored. Psy:  Normal mood, congruent affect. Skin:  Right Knee without bruises, rashes, or erythema. Overlying skin intact.  Right Knee Exam:   General: Antalgic gait favoring right leg.  Walks with decreased knee flexion during swing phase. Standing exam: No varus or valgus deformity of the knee. No pes planus or pes cavus.   Seated Exam:  No significant patellar crepitus, does have somewhat decreased VMO bulk on the right compared to left.  Palpation: Endorses significant tenderness over the medial joint line and extending to the posterior aspect.  No significant tenderness over the lateral joint line.  Does have some tenderness with palpation of the medial and lateral patellar facets.  No tenderness over patellar tendon. Supine exam: Small effusion, normal patellar mobility.   Ligamentous Exam:  Endorses significant pain with Lachman, though seems to have a strong endpoint.  Admittedly, this is limited due to guarding. Endorses pain with varus and valgus stress across the knee, but does not have any apparent laxity.  Meniscus:  McMurray with pain at the medial aspect, without obvious  clicking. Thessaly reproduces medial and posterior joint pain.  Strength: Hip flexion (L1), Hip Aduction (L2), Knee Extension (L3) are 5/5 Bilaterally Foot Inversion (L4), Dorsiflexion (L5), and Eversion (S1) 5/5 Bilaterally  Sensation: Intact to light touch medial and lateral aspects of lower extremities, and lateral, dorsal, and medial aspects of foot.    Imaging: Extremity Ultrasound-right knee:   Quadricepts tendon visualized in long and short access, fibers intact without obvious defects or evidence of tears.  Does have a small effusion within the suprapatellar pouch. Patellar Tendon intact, with  no intratendinous calcifications or thickening.   Lateral joint space preserved. Lateral meniscus appears intact, without significant surrounding fluid.  Overlying lateral collateral ligament fibers appear intact.   Medial joint space appears well preserved. Medial meniscus appears grossly intact without significant surrounding fluid or extrusions.  No obvious defects appreciated with dynamic knee flexion or extension.  Overlying medial collateral ligament appears intact.   No baker's cyst appreciated in popliteal fossa.   Impression: Small effusion, though no obvious meniscal derangements.   Assessment & Plan: 45 year old female presenting to clinic with worsening of chronic right knee pain and swelling.  Patient is significantly tender on examination today, and says "I need to get this fixed."  Given her significant discomfort, guarding on exam, and effusion on ultrasound, will order MRI to further evaluate.  Suspect this may be a degenerative meniscus tear, as she has minimal arthritis on previously obtained x-rays.  Given the chronic nature of her symptoms, we discussed the possibility of a steroid injection at this time.  Patient elected to proceed.  Steroid injection performed as described below.  The patient endorsed significant pain with injection procedure, she did admit that her symptoms were much improved during the anesthetic phase. -Aftercare and return precautions were discussed at length. -We will follow-up with patient pending her MRI results. -Suspect she would benefit from formal physical therapy, though will hold pending her MRI. -Patient expressed understanding with plan.  She no further questions or concerns today.     Procedures: Right knee cortisone Injection:  Risks and benefits of procedure discussed, Patient opted to proceed.  Written consent obtained.  Timeout performed.  Skin prepped with alcohol. Ethyl Chloride was used for topical analgesia.  Right knee was  injected with 3cc 1% Lidocaine without epinephrine and 40 mg methylprednisolone via the lateral midpatellar approach using a 25G, 1.5in needle.   Patient had significant anxiety regarding the injection, though there were no immediate complications, and she voiced immediate improvement in her symptoms. Aftercare instructions were discussed, and patient was given strict return precautions.   Patient seen and evaluated with the sports medicine fellow.  I agree with the above plan of care.  Ultrasound does not show any obvious Baker's cyst.  She does have a small joint effusion.  Injection as above we will order an MRI to evaluate further.  Phone follow-up with those results when available.

## 2021-01-15 ENCOUNTER — Other Ambulatory Visit (HOSPITAL_COMMUNITY): Payer: Self-pay | Admitting: Obstetrics & Gynecology

## 2021-01-15 DIAGNOSIS — Z1231 Encounter for screening mammogram for malignant neoplasm of breast: Secondary | ICD-10-CM

## 2021-01-20 ENCOUNTER — Ambulatory Visit (HOSPITAL_COMMUNITY)
Admission: RE | Admit: 2021-01-20 | Discharge: 2021-01-20 | Disposition: A | Discharge status: Home / Self Care | Payer: No Typology Code available for payment source | Source: Ambulatory Visit | Attending: Sports Medicine | Admitting: Sports Medicine

## 2021-01-20 ENCOUNTER — Ambulatory Visit (HOSPITAL_COMMUNITY)
Admission: RE | Admit: 2021-01-20 | Discharge: 2021-01-20 | Disposition: A | Discharge status: Home / Self Care | Payer: No Typology Code available for payment source | Source: Ambulatory Visit | Attending: Obstetrics & Gynecology | Admitting: Obstetrics & Gynecology

## 2021-01-20 DIAGNOSIS — M25561 Pain in right knee: Secondary | ICD-10-CM | POA: Diagnosis not present

## 2021-01-20 DIAGNOSIS — G8929 Other chronic pain: Secondary | ICD-10-CM | POA: Diagnosis present

## 2021-01-20 DIAGNOSIS — Z1231 Encounter for screening mammogram for malignant neoplasm of breast: Secondary | ICD-10-CM

## 2021-01-21 ENCOUNTER — Telehealth: Payer: Self-pay | Admitting: Sports Medicine

## 2021-01-21 ENCOUNTER — Other Ambulatory Visit: Payer: Self-pay | Admitting: *Deleted

## 2021-01-21 DIAGNOSIS — G8929 Other chronic pain: Secondary | ICD-10-CM

## 2021-01-21 NOTE — Telephone Encounter (Signed)
I spoke with Helen Rice on the phone today after reviewing the MRI of her right knee.  MRI shows a very small peripheral undersurface tear of the medial meniscus.  She continues to endorse pain and swelling when active.  Recent cortisone injection provided her with some relief but her swelling is beginning to return.  I recommend that we try physical therapy.  She would like to do this either in [place] or at [facility].  She will follow-up with me in the office 4 weeks after she starts PT.  If symptoms persist, we may need to refer her to orthopedic surgery to discuss merits of a possible arthroscopy.

## 2021-01-25 ENCOUNTER — Other Ambulatory Visit (HOSPITAL_COMMUNITY): Payer: Self-pay | Admitting: Obstetrics & Gynecology

## 2021-01-25 DIAGNOSIS — R928 Other abnormal and inconclusive findings on diagnostic imaging of breast: Secondary | ICD-10-CM

## 2021-01-28 ENCOUNTER — Other Ambulatory Visit: Payer: Self-pay

## 2021-01-28 ENCOUNTER — Other Ambulatory Visit (HOSPITAL_COMMUNITY): Payer: Self-pay | Admitting: Obstetrics & Gynecology

## 2021-01-28 ENCOUNTER — Ambulatory Visit: Payer: No Typology Code available for payment source

## 2021-01-28 ENCOUNTER — Ambulatory Visit (HOSPITAL_COMMUNITY)
Admission: RE | Admit: 2021-01-28 | Discharge: 2021-01-28 | Disposition: A | Discharge status: Home / Self Care | Payer: No Typology Code available for payment source | Source: Ambulatory Visit | Attending: Obstetrics & Gynecology | Admitting: Obstetrics & Gynecology

## 2021-01-28 DIAGNOSIS — R928 Other abnormal and inconclusive findings on diagnostic imaging of breast: Secondary | ICD-10-CM

## 2021-02-01 ENCOUNTER — Ambulatory Visit: Payer: No Typology Code available for payment source

## 2021-02-02 ENCOUNTER — Other Ambulatory Visit: Payer: Self-pay

## 2021-02-02 ENCOUNTER — Encounter (HOSPITAL_COMMUNITY): Payer: Self-pay

## 2021-02-02 ENCOUNTER — Ambulatory Visit (HOSPITAL_COMMUNITY)
Admission: RE | Admit: 2021-02-02 | Discharge: 2021-02-02 | Disposition: A | Discharge status: Home / Self Care | Payer: No Typology Code available for payment source | Source: Ambulatory Visit | Attending: Obstetrics & Gynecology | Admitting: Obstetrics & Gynecology

## 2021-02-02 ENCOUNTER — Other Ambulatory Visit (HOSPITAL_COMMUNITY): Payer: Self-pay | Admitting: Obstetrics & Gynecology

## 2021-02-02 DIAGNOSIS — N6324 Unspecified lump in the left breast, lower inner quadrant: Secondary | ICD-10-CM | POA: Insufficient documentation

## 2021-02-02 DIAGNOSIS — N6325 Unspecified lump in the left breast, overlapping quadrants: Secondary | ICD-10-CM | POA: Diagnosis not present

## 2021-02-02 DIAGNOSIS — R928 Other abnormal and inconclusive findings on diagnostic imaging of breast: Secondary | ICD-10-CM | POA: Diagnosis not present

## 2021-02-02 DIAGNOSIS — N6342 Unspecified lump in left breast, subareolar: Secondary | ICD-10-CM | POA: Insufficient documentation

## 2021-02-02 MED ORDER — LIDOCAINE-EPINEPHRINE (PF) 2 %-1:200000 IJ SOLN
INTRAMUSCULAR | Status: AC
  Administered 2021-02-02: 10 mL
  Filled 2021-02-02: qty 10

## 2021-02-02 MED ORDER — SODIUM BICARBONATE 4.2 % IV SOLN
INTRAVENOUS | Status: AC
  Administered 2021-02-02: 2.5 meq
  Filled 2021-02-02: qty 10

## 2021-02-02 MED ORDER — LIDOCAINE HCL 2 % IJ SOLN
INTRAMUSCULAR | Status: AC
  Administered 2021-02-02: 200 mg
  Filled 2021-02-02: qty 10

## 2021-02-02 NOTE — Sedation Documentation (Signed)
PT had two biopsies from left breast today with clips inserted. PT ambulatory back to mammogram area this time for imaging post biopsy. PT given copy of discharge instructions and an ice pack and verbalized understanding of discharge instructions.

## 2021-02-03 LAB — SURGICAL PATHOLOGY

## 2021-02-04 ENCOUNTER — Encounter: Payer: Self-pay | Admitting: Physical Therapy

## 2021-02-04 ENCOUNTER — Ambulatory Visit: Payer: No Typology Code available for payment source | Attending: Sports Medicine | Admitting: Physical Therapy

## 2021-02-04 ENCOUNTER — Other Ambulatory Visit: Payer: Self-pay

## 2021-02-04 DIAGNOSIS — G8929 Other chronic pain: Secondary | ICD-10-CM | POA: Insufficient documentation

## 2021-02-04 DIAGNOSIS — M25562 Pain in left knee: Secondary | ICD-10-CM | POA: Diagnosis not present

## 2021-02-04 NOTE — Therapy (Signed)
[facility] [place], [place], [address] Phone: [phone]   Fax:  [phone]  Physical Therapy Evaluation  Patient Details  Name: TERESIA MYINT MRN: [id number] Date of Birth: July 05, 1976 Referring Provider (PT): Lilia Argue   Encounter Date: 02/04/2021   PT End of Session - 02/04/21 1623     Visit Number 1    Number of Visits 12    Date for PT Re-Evaluation 03/25/21    PT Start Time 0317    PT Stop Time 0403    PT Time Calculation (min) 46 min    Activity Tolerance Patient tolerated treatment well    Behavior During Therapy [facility] for tasks assessed/performed             Past Medical History:  Diagnosis Date   Fibromyalgia    GERD (gastroesophageal reflux disease) 05/30/2017   Lyme disease    being seen by [facility]   Psoriasis     Past Surgical History:  Procedure Laterality Date   ABDOMINAL HYSTERECTOMY     APPENDECTOMY     CESAREAN SECTION     CHOLECYSTECTOMY     COLONOSCOPY     upper/lower   GIVENS CAPSULE STUDY N/A 06/21/2017   Procedure: GIVENS CAPSULE STUDY;  Surgeon: Rogene Houston, MD;  [facility];  Service: Endoscopy;  Laterality: N/A;    There were no vitals filed for this visit.    Subjective Assessment - 02/04/21 1628     Subjective COVID-19 screen performed prior to patient entering clinic.  The patient presents to OPPT with c/o chronic knee pain.  She states that about a year ago she began to walk for exercise and to lower her cholesterol when her knee began to swell a great deal.  The pain and swelling has persisted and has prohibited her from exercising like she would like.  She states that a recent injection did help some.  Her pain-level today is rated at a 6/10.  She has not found anything to really decrease her pain on a consistent basis.  She works at a [employer] and has support under her feet and changes positions a lot.    Pertinent History  Fibromyalgia, GERD and Lyme disease.    How long can you walk comfortably? Community distances and not able to walk for exercise like she was due to pain and swelling of right knee.    Diagnostic tests MRI.    Patient Stated Goals Get back to exercising and avoid surgery.    Currently in Pain? Yes    Pain Score 6     Pain Orientation Right    Pain Descriptors / Indicators Aching    Pain Type Chronic pain    Pain Onset More than a month ago    Pain Frequency Constant    Aggravating Factors  See above.    Pain Relieving Factors See above.                [facility] PT Assessment - 02/04/21 0001       Assessment   Medical Diagnosis Chronic pain of right knee.    Referring Provider (PT) Lilia Argue    Onset Date/Surgical Date --   ~a year.     Precautions   Precaution Comments PAIN-FREE RIGHT LE THER EX.      Restrictions   Weight Bearing Restrictions No      Balance Screen   Has the patient fallen in the past 6  months No    Has the patient had a decrease in activity level because of a fear of falling?  Yes    Is the patient reluctant to leave their home because of a fear of falling?  No      Home [employer] residence      Prior Function   Level of Independence Independent      Observation/Other Assessments-Edema    Edema --   No right knee edema present at time of evalution though patient states her knee will still a great deal if she were to walk a lot.     ROM / Strength   AROM / PROM / Strength AROM;Strength      AROM   Overall AROM Comments Normal right knee AROM.      Strength   Overall Strength Comments Normal right LE strength.      Palpation   Palpation comment Tender to palpation over patient's right knee medial joint line and some complaints in region of right lateral tibial plateau.      Special Tests   Other special tests Normal right knee stability testing.      Ambulation/Gait   Gait Comments WNL.                         Objective measurements completed on examination: See above findings.       [facility] Adult PT Treatment/Exercise - 02/04/21 0001       Modalities   Modalities Electrical Stimulation;Vasopneumatic      Electrical Stimulation   Electrical Stimulation Location Right knee medial joint line    Electrical Stimulation Action Pre-mod.    Electrical Stimulation Parameters 80-150 Hz x 15 minutes.    Electrical Stimulation Goals Pain      Vasopneumatic   Number Minutes Vasopneumatic  15 minutes    Vasopnuematic Location  --   Right knee.   Vasopneumatic Pressure Low                         PT Long Term Goals - 02/04/21 1658       PT LONG TERM GOAL #1   Title Independent with a HEP.    Time 6    Period Weeks    Status New      PT LONG TERM GOAL #2   Title Return to exercise walking with pain not > 2/10 and no obvious swelling.    Time 6    Period Weeks    Status New      PT LONG TERM GOAL #3   Title Perform ADL's with pain not > 2/10.    Time 6    Period Weeks    Status New                    Plan - 02/04/21 1650     Clinical Impression Statement The patient presents to OPPT with c/o chronic knee pain.  Her CC is a great deal of pain and swelling with prolonged walking.  She has had to essentially eliminate walking for exercise due to the aforementioned symptoms.  A recent injection did provide her with some relief.  Her right knee active range of motion is normal as is her strength.  Her knee was not swollen today but she has not been very active.  She was palpably tender over her right knee medial joint line.  Patient  will benefit from skilled physical therapy intervention to address pain and deficits.    Personal Factors and Comorbidities Comorbidity 1;Other    Comorbidities Fibromyalgia, GERD and Lyme disease.    Examination-Activity Limitations Other;Locomotion Level    Examination-Participation Restrictions Other     Stability/Clinical Decision Making Stable/Uncomplicated    Rehab Potential Good    PT Frequency 2x / week    PT Duration 6 weeks    PT Treatment/Interventions ADLs/Self Care Home Management;Cryotherapy;Electrical Stimulation;Ultrasound;Moist Heat;Iontophoresis 4mg /ml Dexamethasone;Therapeutic activities;Functional mobility training;Therapeutic exercise;Manual techniques;Patient/family education;Passive range of motion;Dry needling;Vasopneumatic Device    PT Next Visit Plan PAIN-FREE right LE Ther ex.  Recumbent bike.  O and CKC exercise.    Consulted and Agree with Plan of Care Patient             Patient will benefit from skilled therapeutic intervention in order to improve the following deficits and impairments:  Decreased activity tolerance, Pain  Visit Diagnosis: Chronic pain of left knee - Plan: PT plan of care cert/re-cert     Problem List Patient Active Problem List   Diagnosis Date Noted   Pain of upper abdomen 02/28/2019   Generalized anxiety disorder 02/28/2019   Other headache syndrome 12/25/2018   Transient alteration of awareness 09/25/2018   Lower back pain 09/25/2018   Leg weakness 09/25/2018   Anxiety 09/25/2018   Vitamin D deficiency 08/16/2018   Chronic cholecystitis 05/21/2018   Gallbladder polyp 05/21/2018   Guaiac positive stools 06/15/2017   GERD (gastroesophageal reflux disease) 05/30/2017    Monick Rena, [place] MPT 02/04/2021, 5:01 PM  [facility] [place], [place], [address] Phone: [phone]   Fax:  [phone]  Name: JALIYA SIEGMANN MRN: [id number] Date of Birth: [birth year]

## 2021-02-09 ENCOUNTER — Ambulatory Visit: Payer: No Typology Code available for payment source | Attending: Sports Medicine | Admitting: *Deleted

## 2021-02-09 ENCOUNTER — Other Ambulatory Visit: Payer: Self-pay

## 2021-02-09 DIAGNOSIS — M25562 Pain in left knee: Secondary | ICD-10-CM | POA: Insufficient documentation

## 2021-02-09 DIAGNOSIS — G8929 Other chronic pain: Secondary | ICD-10-CM | POA: Insufficient documentation

## 2021-02-09 NOTE — Therapy (Signed)
[facility] [place], [place], [address] Phone: [phone]   Fax:  [phone]  Physical Therapy Treatment  Patient Details  Name: Helen Rice MRN: [id number] Date of Birth: 1976/07/26 Referring Provider (PT): Lilia Argue   Encounter Date: 02/09/2021   PT End of Session - 02/09/21 1751     Visit Number 2    Number of Visits 12    Date for PT Re-Evaluation 03/25/21    PT Start Time 6384    PT Stop Time 6659    PT Time Calculation (min) 44 min             Past Medical History:  Diagnosis Date   Fibromyalgia    GERD (gastroesophageal reflux disease) 05/30/2017   Lyme disease    being seen by [facility]   Psoriasis     Past Surgical History:  Procedure Laterality Date   ABDOMINAL HYSTERECTOMY     APPENDECTOMY     CESAREAN SECTION     CHOLECYSTECTOMY     COLONOSCOPY     upper/lower   GIVENS CAPSULE STUDY N/A 06/21/2017   Procedure: GIVENS CAPSULE STUDY;  Surgeon: Rogene Houston, MD;  Location[facility];  Service: Endoscopy;  Laterality: N/A;    There were no vitals filed for this visit.   Subjective Assessment - 02/09/21 1649     Subjective COVID-19 screen performed prior to patient entering clinic.  The patient presents to OPPT with c/o chronic knee pain.  Small meniscus tear RT knee as per MRI findings    Pertinent History Fibromyalgia, GERD and Lyme disease., MRI findings RT knee medial posterior horn- meniscus tear    How long can you walk comfortably? Community distances and not able to walk for exercise like she was due to pain and swelling of right knee.    Diagnostic tests MRI.    Patient Stated Goals Get back to exercising and avoid surgery.    Currently in Pain? Yes    Pain Score 5     Pain Orientation Right    Pain Descriptors / Indicators Aching;Sore    Pain Type Chronic pain    Pain Onset More than a month ago                                [facility] Adult PT Treatment/Exercise - 02/09/21 0001       Exercises   Exercises Knee/Hip      Knee/Hip Exercises: Aerobic   Recumbent Bike L3 seat 1 x 6 mins      Knee/Hip Exercises: Standing   Heel Raises 2 sets;Both;10 reps    Heel Raises Limitations toe ups 2x10      Knee/Hip Exercises: Seated   Long Arc Quad Strengthening;Right;3 sets;10 reps   hold for 2 secs   Long Arc Quad Weight 2 lbs.    Sit to Sand 10 reps;with UE support;3 sets   with focus on glute activation            Handout given and reviewed for HEP            PT Long Term Goals - 02/04/21 1658       PT LONG TERM GOAL #1   Title Independent with a HEP.    Time 6    Period Weeks    Status New      PT LONG TERM GOAL #2  Title Return to exercise walking with pain not > 2/10 and no obvious swelling.    Time 6    Period Weeks    Status New      PT LONG TERM GOAL #3   Title Perform ADL's with pain not > 2/10.    Time 6    Period Weeks    Status New                   Plan - 02/09/21 1752     Clinical Impression Statement Pt arrived today doing fair, but still with RT knee pain and swelling. Rx focused on RT LE strengthening with ankle and hip as well as quad. Pt did well with CKC and OKC exs. Handout for exs given and v/cs needed for technique especiaaly glute activation and jt alignment with sit to stand.    Personal Factors and Comorbidities Comorbidity 1;Other    Comorbidities Fibromyalgia, GERD and Lyme disease.    Examination-Activity Limitations Other;Locomotion Level    Examination-Participation Restrictions Other    Stability/Clinical Decision Making Stable/Uncomplicated    Rehab Potential Good    PT Frequency 2x / week    PT Duration 6 weeks    PT Treatment/Interventions ADLs/Self Care Home Management;Cryotherapy;Electrical Stimulation;Ultrasound;Moist Heat;Iontophoresis 4mg /ml Dexamethasone;Therapeutic activities;Functional  mobility training;Therapeutic exercise;Manual techniques;Patient/family education;Passive range of motion;Dry needling;Vasopneumatic Device    PT Next Visit Plan PAIN-FREE right LE Ther ex.  Recumbent bike.  O and CKC exercise.    Consulted and Agree with Plan of Care Patient             Patient will benefit from skilled therapeutic intervention in order to improve the following deficits and impairments:  Decreased activity tolerance, Pain  Visit Diagnosis: Chronic pain of left knee     Problem List Patient Active Problem List   Diagnosis Date Noted   Pain of upper abdomen 02/28/2019   Generalized anxiety disorder 02/28/2019   Other headache syndrome 12/25/2018   Transient alteration of awareness 09/25/2018   Lower back pain 09/25/2018   Leg weakness 09/25/2018   Anxiety 09/25/2018   Vitamin D deficiency 08/16/2018   Chronic cholecystitis 05/21/2018   Gallbladder polyp 05/21/2018   Guaiac positive stools 06/15/2017   GERD (gastroesophageal reflux disease) 05/30/2017    Tamberlyn Midgley,CHRIS, PTA 02/09/2021, 6:01 PM  [facility] [address] [place], [place], [address] Phone: [phone]   Fax:  [phone]  Name: Helen Rice MRN: [id number] Date of Birth: April 06, 1976

## 2021-02-11 ENCOUNTER — Encounter: Payer: Self-pay | Admitting: Physical Therapy

## 2021-02-11 ENCOUNTER — Ambulatory Visit: Payer: No Typology Code available for payment source | Admitting: Physical Therapy

## 2021-02-11 DIAGNOSIS — M25562 Pain in left knee: Secondary | ICD-10-CM | POA: Diagnosis not present

## 2021-02-11 DIAGNOSIS — G8929 Other chronic pain: Secondary | ICD-10-CM

## 2021-02-11 NOTE — Therapy (Signed)
[facility] [place], [place], [address] Phone: [phone]   Fax:  [phone]  Physical Therapy Treatment  Patient Details  Name: Helen Rice MRN: [id number] Date of Birth: 11-25-1975 Referring Provider (PT): Lilia Argue   Encounter Date: 02/11/2021   PT End of Session - 02/11/21 1617     Visit Number 3    Number of Visits 12    Date for PT Re-Evaluation 03/25/21    PT Start Time 5631    PT Stop Time 4970    PT Time Calculation (min) 38 min    Activity Tolerance Patient tolerated treatment well    Behavior During Therapy [facility] for tasks assessed/performed             Past Medical History:  Diagnosis Date   Fibromyalgia    GERD (gastroesophageal reflux disease) 05/30/2017   Lyme disease    being seen by [facility]   Psoriasis     Past Surgical History:  Procedure Laterality Date   ABDOMINAL HYSTERECTOMY     APPENDECTOMY     CESAREAN SECTION     CHOLECYSTECTOMY     COLONOSCOPY     upper/lower   GIVENS CAPSULE STUDY N/A 06/21/2017   Procedure: GIVENS CAPSULE STUDY;  Surgeon: Rogene Houston, MD;  [facility];  Service: Endoscopy;  Laterality: N/A;    There were no vitals filed for this visit.   Subjective Assessment - 02/11/21 1606     Subjective COVID-19 screen performed prior to patient entering clinic. Reports that with HEP she has had more pain and tender to palpation in posteriolateral R calf.    Pertinent History Fibromyalgia, GERD and Lyme disease., MRI findings RT knee medial posterior horn- meniscus tear    How long can you walk comfortably? Community distances and not able to walk for exercise like she was due to pain and swelling of right knee.    Diagnostic tests MRI.    Patient Stated Goals Get back to exercising and avoid surgery.    Currently in Pain? Yes    Pain Score 5     Pain Location Knee    Pain Orientation Right    Pain Descriptors / Indicators  Sore    Pain Type Chronic pain    Pain Onset More than a month ago    Pain Frequency Constant                [facility] PT Assessment - 02/11/21 0001       Assessment   Medical Diagnosis Chronic pain of right knee.    Referring Provider (PT) Lilia Argue    Next MD Visit 03/02/2021      Precautions   Precaution Comments PAIN-FREE RIGHT LE THER EX.      Restrictions   Weight Bearing Restrictions No                           [facility] Adult PT Treatment/Exercise - 02/11/21 0001       Knee/Hip Exercises: Aerobic   Recumbent Bike L1, seat 5 x13 min      Knee/Hip Exercises: Supine   Short Arc Quad Sets AROM;Right;3 sets;10 reps    Short Arc Quad Sets Limitations 3-5 sec holds    Hip Adduction Isometric Strengthening;Both;3 sets;10 reps    Straight Leg Raises Strengthening;Right;2 sets;10 reps      Modalities   Modalities Vasopneumatic  Vasopneumatic   Number Minutes Vasopneumatic  10 minutes    Vasopnuematic Location  Knee    Vasopneumatic Pressure Low    Vasopneumatic Temperature  34/pain and edema                         PT Long Term Goals - 02/04/21 1658       PT LONG TERM GOAL #1   Title Independent with a HEP.    Time 6    Period Weeks    Status New      PT LONG TERM GOAL #2   Title Return to exercise walking with pain not > 2/10 and no obvious swelling.    Time 6    Period Weeks    Status New      PT LONG TERM GOAL #3   Title Perform ADL's with pain not > 2/10.    Time 6    Period Weeks    Status New                   Plan - 02/11/21 1704     Clinical Impression Statement Patient presented in clinic with reports of continued R knee pain but also of palpable tenderness along posteriolater R calf since beginning HEP. Patient has also noticed compensatory hip IR which it was reiterated of the importance of joint alignment. Patient also found small nodules in posterior HS region distally but unable to detect  them following end of session. Edema notable surrounding R patella at this time. Patient progressed through light, pain tolerable therex for strengthening in which patient was somewhat limited especially with SLR due to weakness and fatigue. Patient limited with recreational walking and exercise due to R knee pain and edema. Normal vasopneumatic response noted following removal of the modality.    Personal Factors and Comorbidities Comorbidity 1;Other    Comorbidities Fibromyalgia, GERD and Lyme disease.    Examination-Activity Limitations Other;Locomotion Level    Examination-Participation Restrictions Other    Stability/Clinical Decision Making Stable/Uncomplicated    Rehab Potential Good    PT Frequency 2x / week    PT Duration 6 weeks    PT Treatment/Interventions ADLs/Self Care Home Management;Cryotherapy;Electrical Stimulation;Ultrasound;Moist Heat;Iontophoresis 4mg /ml Dexamethasone;Therapeutic activities;Functional mobility training;Therapeutic exercise;Manual techniques;Patient/family education;Passive range of motion;Dry needling;Vasopneumatic Device    PT Next Visit Plan PAIN-FREE right LE Ther ex.  Recumbent bike.  O and CKC exercise.    Consulted and Agree with Plan of Care Patient             Patient will benefit from skilled therapeutic intervention in order to improve the following deficits and impairments:  Decreased activity tolerance, Pain  Visit Diagnosis: Chronic pain of left knee     Problem List Patient Active Problem List   Diagnosis Date Noted   Pain of upper abdomen 02/28/2019   Generalized anxiety disorder 02/28/2019   Other headache syndrome 12/25/2018   Transient alteration of awareness 09/25/2018   Lower back pain 09/25/2018   Leg weakness 09/25/2018   Anxiety 09/25/2018   Vitamin D deficiency 08/16/2018   Chronic cholecystitis 05/21/2018   Gallbladder polyp 05/21/2018   Guaiac positive stools 06/15/2017   GERD (gastroesophageal reflux disease)  05/30/2017    Standley Brooking, PTA 02/11/2021, 5:18 PM  [facility] [address] [place], [place], [address] Phone: [phone]   Fax:  [phone]  Name: RAELLE CHAMBERS MRN: [id number] Date of Birth: 05-25-1976

## 2021-02-16 ENCOUNTER — Ambulatory Visit: Payer: No Typology Code available for payment source | Admitting: *Deleted

## 2021-02-16 DIAGNOSIS — M25562 Pain in left knee: Secondary | ICD-10-CM

## 2021-02-16 DIAGNOSIS — G8929 Other chronic pain: Secondary | ICD-10-CM

## 2021-02-16 NOTE — Therapy (Signed)
[facility] [place], [place], [address] Phone: [phone]   Fax:  [phone]  Physical Therapy Treatment  Patient Details  Name: Helen Rice MRN: [id number] Date of Birth: 08-31-1975 Referring Provider (PT): Lilia Argue   Encounter Date: 02/16/2021   PT End of Session - 02/16/21 1657     Visit Number 4    Number of Visits 12    Date for PT Re-Evaluation 03/25/21    PT Start Time 8185    PT Stop Time 6314    PT Time Calculation (min) 48 min             Past Medical History:  Diagnosis Date   Fibromyalgia    GERD (gastroesophageal reflux disease) 05/30/2017   Lyme disease    being seen by [facility]   Psoriasis     Past Surgical History:  Procedure Laterality Date   ABDOMINAL HYSTERECTOMY     APPENDECTOMY     CESAREAN SECTION     CHOLECYSTECTOMY     COLONOSCOPY     upper/lower   GIVENS CAPSULE STUDY N/A 06/21/2017   Procedure: GIVENS CAPSULE STUDY;  Surgeon: Rogene Houston, MD;  [facility];  Service: Endoscopy;  Laterality: N/A;    There were no vitals filed for this visit.   Subjective Assessment - 02/16/21 1655     Subjective COVID-19 screen performed prior to patient entering clinic. Reports that with HEP she has had more pain and tender to palpation in posteriolateral R calf. 2-3/10    Pertinent History Fibromyalgia, GERD and Lyme disease., MRI findings RT knee medial posterior horn- meniscus tear    How long can you walk comfortably? Community distances and not able to walk for exercise like she was due to pain and swelling of right knee.    Diagnostic tests MRI.    Patient Stated Goals Get back to exercising and avoid surgery.    Currently in Pain? Yes    Pain Score 3     Pain Location Knee    Pain Orientation Right    Pain Descriptors / Indicators Sore    Pain Type Chronic pain    Pain Onset More than a month ago                                [facility] Adult PT Treatment/Exercise - 02/16/21 0001       Exercises   Exercises Knee/Hip      Knee/Hip Exercises: Aerobic   Recumbent Bike L1, seat 5 x13 min   LEs only     Knee/Hip Exercises: Supine   Short Arc Quad Sets AROM;Right;3 sets;10 reps    Short Arc Quad Sets Limitations 2# hold 5 secs    Hip Adduction Isometric Strengthening;Both;10 reps;2 sets   ball squeeze hold 10 secs   Bridges 3 sets;10 reps    Straight Leg Raises Strengthening;Right;10 reps;3 sets      Knee/Hip Exercises: Sidelying   Hip ABduction AROM;Right;3 sets;15 reps   3x fatigue     Modalities   Modalities Vasopneumatic      Vasopneumatic   Number Minutes Vasopneumatic  10 minutes    Vasopnuematic Location  Knee    Vasopneumatic Pressure Low    Vasopneumatic Temperature  34/pain and edema  PT Long Term Goals - 02/04/21 1658       PT LONG TERM GOAL #1   Title Independent with a HEP.    Time 6    Period Weeks    Status New      PT LONG TERM GOAL #2   Title Return to exercise walking with pain not > 2/10 and no obvious swelling.    Time 6    Period Weeks    Status New      PT LONG TERM GOAL #3   Title Perform ADL's with pain not > 2/10.    Time 6    Period Weeks    Status New                   Plan - 02/16/21 1752     Clinical Impression Statement Pt arrived today doing a little better with decreased pain, but still with locking-up at times. She was able to perform mainly OKC therex today and did well. Mild discomfort with SAQs. Vaso end of session with good response.    Personal Factors and Comorbidities Comorbidity 1;Other    Examination-Activity Limitations Other;Locomotion Level    Examination-Participation Restrictions Other    Stability/Clinical Decision Making Stable/Uncomplicated    Rehab Potential Good    PT Frequency 2x / week    PT Duration 6 weeks    PT Treatment/Interventions ADLs/Self  Care Home Management;Cryotherapy;Electrical Stimulation;Ultrasound;Moist Heat;Iontophoresis 4mg /ml Dexamethasone;Therapeutic activities;Functional mobility training;Therapeutic exercise;Manual techniques;Patient/family education;Passive range of motion;Dry needling;Vasopneumatic Device    PT Next Visit Plan PAIN-FREE right LE Ther ex.  Recumbent bike.  O and CKC exercise.    Consulted and Agree with Plan of Care Patient             Patient will benefit from skilled therapeutic intervention in order to improve the following deficits and impairments:  Decreased activity tolerance, Pain  Visit Diagnosis: Chronic pain of left knee     Problem List Patient Active Problem List   Diagnosis Date Noted   Pain of upper abdomen 02/28/2019   Generalized anxiety disorder 02/28/2019   Other headache syndrome 12/25/2018   Transient alteration of awareness 09/25/2018   Lower back pain 09/25/2018   Leg weakness 09/25/2018   Anxiety 09/25/2018   Vitamin D deficiency 08/16/2018   Chronic cholecystitis 05/21/2018   Gallbladder polyp 05/21/2018   Guaiac positive stools 06/15/2017   GERD (gastroesophageal reflux disease) 05/30/2017    Marcia Hartwell,CHRIS, PTA 02/16/2021, 6:03 PM  [facility] [address] [place], [place], [address] Phone: [phone]   Fax:  [phone]  Name: Helen Rice MRN: [id number] Date of Birth: May 04, 1976

## 2021-02-18 ENCOUNTER — Other Ambulatory Visit: Payer: Self-pay

## 2021-02-18 ENCOUNTER — Ambulatory Visit: Payer: No Typology Code available for payment source | Admitting: *Deleted

## 2021-02-18 DIAGNOSIS — M25562 Pain in left knee: Secondary | ICD-10-CM | POA: Diagnosis not present

## 2021-02-18 DIAGNOSIS — G8929 Other chronic pain: Secondary | ICD-10-CM

## 2021-02-18 NOTE — Therapy (Signed)
[facility] [place], [place], [address] Phone: [phone]   Fax:  [phone]  Physical Therapy Treatment  Patient Details  Name: Helen Rice MRN: [id number] Date of Birth: 1976-05-11 Referring Provider (PT): Lilia Argue   Encounter Date: 02/18/2021   PT End of Session - 02/18/21 1737     Visit Number 5    Number of Visits 12    Date for PT Re-Evaluation 03/25/21    PT Start Time 5361    PT Stop Time 4431    PT Time Calculation (min) 50 min             Past Medical History:  Diagnosis Date   Fibromyalgia    GERD (gastroesophageal reflux disease) 05/30/2017   Lyme disease    being seen by [facility]   Psoriasis     Past Surgical History:  Procedure Laterality Date   ABDOMINAL HYSTERECTOMY     APPENDECTOMY     CESAREAN SECTION     CHOLECYSTECTOMY     COLONOSCOPY     upper/lower   GIVENS CAPSULE STUDY N/A 06/21/2017   Procedure: GIVENS CAPSULE STUDY;  Surgeon: Rogene Houston, MD;  [facility];  Service: Endoscopy;  Laterality: N/A;    There were no vitals filed for this visit.                      [facility] Adult PT Treatment/Exercise - 02/18/21 0001       Exercises   Exercises Knee/Hip      Knee/Hip Exercises: Aerobic   Recumbent Bike L1, seat 2 x10 min   LEs only     Knee/Hip Exercises: Standing   Forward Step Up 2 sets;Right;10 reps;Hand Hold: 2;Step Height: 4"    Rocker Board 3 minutes   balance     Knee/Hip Exercises: Supine   Short Arc Quad Sets Strengthening;Right;3 sets    Short Arc Quad Sets Limitations 3# hold 5 secs    Hip Adduction Isometric Strengthening;Both;10 reps;2 sets   ball squeeze hold 10 secs   Bridges 3 sets;10 reps    Straight Leg Raises Strengthening;Right;10 reps;3 sets      Knee/Hip Exercises: Sidelying   Hip ABduction AROM;Right;3 sets;15 reps   3x fatigue     Modalities   Modalities Vasopneumatic       Vasopneumatic   Number Minutes Vasopneumatic  10 minutes    Vasopnuematic Location  Knee    Vasopneumatic Pressure Low    Vasopneumatic Temperature  34/pain and edema                         PT Long Term Goals - 02/04/21 1658       PT LONG TERM GOAL #1   Title Independent with a HEP.    Time 6    Period Weeks    Status New      PT LONG TERM GOAL #2   Title Return to exercise walking with pain not > 2/10 and no obvious swelling.    Time 6    Period Weeks    Status New      PT LONG TERM GOAL #3   Title Perform ADL's with pain not > 2/10.    Time 6    Period Weeks    Status New                   Plan -  02/18/21 1740     Clinical Impression Statement Pt arrived today doing ok , but still with pain 5-6/10 at times. Pt was able to perform some CKC exs today as well as OKC. Mild icrease in pain , but resolved end of session.    Personal Factors and Comorbidities Comorbidity 1;Other    Comorbidities Fibromyalgia, GERD and Lyme disease.    Stability/Clinical Decision Making Stable/Uncomplicated    Rehab Potential Good    PT Frequency 2x / week    PT Duration 6 weeks    PT Treatment/Interventions ADLs/Self Care Home Management;Cryotherapy;Electrical Stimulation;Ultrasound;Moist Heat;Iontophoresis 4mg /ml Dexamethasone;Therapeutic activities;Functional mobility training;Therapeutic exercise;Manual techniques;Patient/family education;Passive range of motion;Dry needling;Vasopneumatic Device    PT Next Visit Plan PAIN-FREE right LE Ther ex.  Recumbent bike.  O and CKC exercise.    Consulted and Agree with Plan of Care Patient             Patient will benefit from skilled therapeutic intervention in order to improve the following deficits and impairments:  Decreased activity tolerance, Pain  Visit Diagnosis: Chronic pain of left knee     Problem List Patient Active Problem List   Diagnosis Date Noted   Pain of upper abdomen 02/28/2019    Generalized anxiety disorder 02/28/2019   Other headache syndrome 12/25/2018   Transient alteration of awareness 09/25/2018   Lower back pain 09/25/2018   Leg weakness 09/25/2018   Anxiety 09/25/2018   Vitamin D deficiency 08/16/2018   Chronic cholecystitis 05/21/2018   Gallbladder polyp 05/21/2018   Guaiac positive stools 06/15/2017   GERD (gastroesophageal reflux disease) 05/30/2017    Chamille Werntz,CHRIS, PTA 02/18/2021, 5:45 PM  [facility] [place], [place], [address] Phone: [phone]   Fax:  [phone]  Name: Helen Rice MRN: [id number] Date of Birth: [birth year]

## 2021-02-23 ENCOUNTER — Encounter: Payer: Self-pay | Admitting: Physical Therapy

## 2021-02-23 ENCOUNTER — Ambulatory Visit: Payer: No Typology Code available for payment source | Admitting: Physical Therapy

## 2021-02-23 ENCOUNTER — Other Ambulatory Visit: Payer: Self-pay

## 2021-02-23 DIAGNOSIS — M25562 Pain in left knee: Secondary | ICD-10-CM | POA: Diagnosis not present

## 2021-02-23 DIAGNOSIS — G8929 Other chronic pain: Secondary | ICD-10-CM

## 2021-02-23 NOTE — Therapy (Signed)
[facility] [place], [place], [address] Phone: [phone]   Fax:  [phone]  Physical Therapy Treatment  Patient Details  Name: Helen Rice MRN: [id number] Date of Birth: [birth year] Referring Provider (PT): Lilia Argue   Encounter Date: 02/23/2021   PT End of Session - 02/23/21 1602     Visit Number 6    Number of Visits 12    Date for PT Re-Evaluation 03/25/21    PT Start Time 1600    PT Stop Time 1640    PT Time Calculation (min) 40 min    Activity Tolerance Patient tolerated treatment well    Behavior During Therapy [facility] for tasks assessed/performed             Past Medical History:  Diagnosis Date   Fibromyalgia    GERD (gastroesophageal reflux disease) 05/30/2017   Lyme disease    being seen by [facility]   Psoriasis     Past Surgical History:  Procedure Laterality Date   ABDOMINAL HYSTERECTOMY     APPENDECTOMY     CESAREAN SECTION     CHOLECYSTECTOMY     COLONOSCOPY     upper/lower   GIVENS CAPSULE STUDY N/A 06/21/2017   Procedure: GIVENS CAPSULE STUDY;  Surgeon: Rogene Houston, MD;  [facility];  Service: Endoscopy;  Laterality: N/A;    There were no vitals filed for this visit.   Subjective Assessment - 02/23/21 1601     Subjective COVID-19 screen performed prior to patient entering clinic. Reports her knee pain is about the same today.    Pertinent History Fibromyalgia, GERD and Lyme disease., MRI findings RT knee medial posterior horn- meniscus tear    How long can you walk comfortably? Community distances and not able to walk for exercise like she was due to pain and swelling of right knee.    Diagnostic tests MRI.    Patient Stated Goals Get back to exercising and avoid surgery.    Currently in Pain? Yes    Pain Score 3     Pain Location Knee    Pain Orientation Right    Pain Descriptors / Indicators Sore    Pain Type Chronic pain    Pain Onset  More than a month ago    Pain Frequency Constant                [facility] PT Assessment - 02/23/21 0001       Assessment   Medical Diagnosis Chronic pain of right knee.    Referring Provider (PT) Lilia Argue    Next MD Visit 03/02/2021      Precautions   Precaution Comments PAIN-FREE RIGHT LE THER EX.                           [facility] Adult PT Treatment/Exercise - 02/23/21 0001       Knee/Hip Exercises: Aerobic   Recumbent Bike L3, seat 7 x12 min      Knee/Hip Exercises: Standing   Heel Raises Limitations B toe raise x20 reps    Terminal Knee Extension Strengthening;Right;20 reps;Theraband    Theraband Level (Terminal Knee Extension) Level 1 (Yellow)    Step Down Right;20 reps;Hand Hold: 2;Step Height: 2"    Rocker Board 3 minutes      Knee/Hip Exercises: Supine   Short Hydrographic surveyor Limitations 4#  Hip Adduction Isometric Strengthening;Both;10 reps;2 sets    Straight Leg Raises Strengthening;Right;10 reps;3 sets      Modalities   Modalities Vasopneumatic      Vasopneumatic   Number Minutes Vasopneumatic  10 minutes    Vasopnuematic Location  Knee    Vasopneumatic Pressure Low    Vasopneumatic Temperature  34/pain and edema                         PT Long Term Goals - 02/04/21 1658       PT LONG TERM GOAL #1   Title Independent with a HEP.    Time 6    Period Weeks    Status New      PT LONG TERM GOAL #2   Title Return to exercise walking with pain not > 2/10 and no obvious swelling.    Time 6    Period Weeks    Status New      PT LONG TERM GOAL #3   Title Perform ADL's with pain not > 2/10.    Time 6    Period Weeks    Status New                   Plan - 02/23/21 1710     Clinical Impression Statement Patient presented in clinic with reports of continued R knee pain. Patient also reported feeling as if knee would lock up while on Nustep. Patient guided  through more lightly resisted and CKC/OKC strengthening of RLE with intermittant breaks in order to flex R knee as needed. Normal vasopneumatic response noted following removal of the modality.    Personal Factors and Comorbidities Comorbidity 1;Other    Comorbidities Fibromyalgia, GERD and Lyme disease.    Examination-Activity Limitations Other;Locomotion Level    Examination-Participation Restrictions Other    Stability/Clinical Decision Making Stable/Uncomplicated    Rehab Potential Good    PT Frequency 2x / week    PT Duration 6 weeks    PT Treatment/Interventions ADLs/Self Care Home Management;Cryotherapy;Electrical Stimulation;Ultrasound;Moist Heat;Iontophoresis 4mg /ml Dexamethasone;Therapeutic activities;Functional mobility training;Therapeutic exercise;Manual techniques;Patient/family education;Passive range of motion;Dry needling;Vasopneumatic Device    PT Next Visit Plan PAIN-FREE right LE Ther ex.  Recumbent bike.  O and CKC exercise.    Consulted and Agree with Plan of Care Patient             Patient will benefit from skilled therapeutic intervention in order to improve the following deficits and impairments:  Decreased activity tolerance, Pain  Visit Diagnosis: Chronic pain of left knee     Problem List Patient Active Problem List   Diagnosis Date Noted   Pain of upper abdomen 02/28/2019   Generalized anxiety disorder 02/28/2019   Other headache syndrome 12/25/2018   Transient alteration of awareness 09/25/2018   Lower back pain 09/25/2018   Leg weakness 09/25/2018   Anxiety 09/25/2018   Vitamin D deficiency 08/16/2018   Chronic cholecystitis 05/21/2018   Gallbladder polyp 05/21/2018   Guaiac positive stools 06/15/2017   GERD (gastroesophageal reflux disease) 05/30/2017    Standley Brooking, PTA 02/23/2021, 5:13 PM  [facility] [place], [place], [address] Phone: [phone]   Fax:   [phone]  Name: Helen Rice MRN: [id number] Date of Birth: 1975-12-26

## 2021-02-25 ENCOUNTER — Ambulatory Visit: Payer: No Typology Code available for payment source | Admitting: *Deleted

## 2021-02-25 DIAGNOSIS — M25562 Pain in left knee: Secondary | ICD-10-CM | POA: Diagnosis not present

## 2021-02-25 DIAGNOSIS — G8929 Other chronic pain: Secondary | ICD-10-CM

## 2021-02-25 NOTE — Therapy (Signed)
[facility] [place], [place], [address] Phone: [phone]   Fax:  [phone]  Physical Therapy Treatment  Patient Details  Name: YTZEL GUBLER MRN: [id number] Date of Birth: 10/29/1975 Referring Provider (PT): Lilia Argue   Encounter Date: 02/25/2021   PT End of Session - 02/25/21 1650     Visit Number 7    Number of Visits 12    Date for PT Re-Evaluation 03/25/21    PT Start Time 6195    PT Stop Time 0932    PT Time Calculation (min) 50 min             Past Medical History:  Diagnosis Date   Fibromyalgia    GERD (gastroesophageal reflux disease) 05/30/2017   Lyme disease    being seen by [facility]   Psoriasis     Past Surgical History:  Procedure Laterality Date   ABDOMINAL HYSTERECTOMY     APPENDECTOMY     CESAREAN SECTION     CHOLECYSTECTOMY     COLONOSCOPY     upper/lower   GIVENS CAPSULE STUDY N/A 06/21/2017   Procedure: GIVENS CAPSULE STUDY;  Surgeon: Rogene Houston, MD;  Location: [facility];  Service: Endoscopy;  Laterality: N/A;    There were no vitals filed for this visit.   Subjective Assessment - 02/25/21 1649     Subjective COVID-19 screen performed prior to patient entering clinic. Reports her knee pain is about the same today.3-4/10    Pertinent History Fibromyalgia, GERD and Lyme disease., MRI findings RT knee medial posterior horn- meniscus tear    How long can you walk comfortably? Community distances and not able to walk for exercise like she was due to pain and swelling of right knee.    Diagnostic tests MRI.    Patient Stated Goals Get back to exercising and avoid surgery.    Currently in Pain? Yes    Pain Score 4     Pain Location Knee    Pain Orientation Right    Pain Descriptors / Indicators Sore    Pain Onset More than a month ago                               [facility] Adult PT Treatment/Exercise - 02/25/21 0001        Exercises   Exercises Knee/Hip      Knee/Hip Exercises: Aerobic   Recumbent Bike L3, seat 7 x12 min      Knee/Hip Exercises: Standing   Heel Raises 2 sets;Both;10 reps    Heel Raises Limitations B toe raise x20 reps    Forward Step Up 2 sets;Right;10 reps;Hand Hold: 2;Step Height: 4"    Rocker Board 3 minutes   balance   SLS RT LE short bouts      Knee/Hip Exercises: Seated   Long Arc Quad Strengthening;Right;3 sets;10 reps    Long Arc Quad Weight 2 lbs.    Ball Squeeze x10 hold 10 secs      Knee/Hip Exercises: Supine   Straight Leg Raises Strengthening;Right;10 reps;3 sets      Knee/Hip Exercises: Sidelying   Hip ABduction AROM;Right;3 sets;15 reps   3x fatigue     Modalities   Modalities Vasopneumatic      Vasopneumatic   Number Minutes Vasopneumatic  10 minutes    Vasopnuematic Location  Knee    Vasopneumatic Pressure Low    Vasopneumatic Temperature  34/pain and edema                         PT Long Term Goals - 02/25/21 1720       PT LONG TERM GOAL #1   Title Independent with a HEP.    Time 6    Period Weeks    Status Achieved      PT LONG TERM GOAL #2   Title Return to exercise walking with pain not > 2/10 and no obvious swelling.    Period Weeks    Status On-going      PT LONG TERM GOAL #3   Title Perform ADL's with pain not > 2/10.    Period Weeks    Status On-going                   Plan - 02/25/21 1650     Clinical Impression Statement Pt arrived today doing about the same with intermittent RT knee pain. Her pain is posterior and lateral at times. Pt also reports of her knee locking at times when in full extension.She is independent with current HEP for CKC and OKC pain free exs. To MD Tuesday    Personal Factors and Comorbidities Comorbidity 1;Other    Comorbidities Fibromyalgia, GERD and Lyme disease.    Examination-Activity Limitations Other;Locomotion Level    Examination-Participation Restrictions Other     Stability/Clinical Decision Making Stable/Uncomplicated    Rehab Potential Good    PT Frequency 2x / week    PT Treatment/Interventions ADLs/Self Care Home Management;Cryotherapy;Electrical Stimulation;Ultrasound;Moist Heat;Iontophoresis 4mg /ml Dexamethasone;Therapeutic activities;Functional mobility training;Therapeutic exercise;Manual techniques;Patient/family education;Passive range of motion;Dry needling;Vasopneumatic Device    PT Next Visit Plan PAIN-FREE right LE Ther ex.  Recumbent bike.  O and CKC exercise.    Consulted and Agree with Plan of Care Patient             Patient will benefit from skilled therapeutic intervention in order to improve the following deficits and impairments:  Decreased activity tolerance, Pain  Visit Diagnosis: Chronic pain of left knee     Problem List Patient Active Problem List   Diagnosis Date Noted   Pain of upper abdomen 02/28/2019   Generalized anxiety disorder 02/28/2019   Other headache syndrome 12/25/2018   Transient alteration of awareness 09/25/2018   Lower back pain 09/25/2018   Leg weakness 09/25/2018   Anxiety 09/25/2018   Vitamin D deficiency 08/16/2018   Chronic cholecystitis 05/21/2018   Gallbladder polyp 05/21/2018   Guaiac positive stools 06/15/2017   GERD (gastroesophageal reflux disease) 05/30/2017    Kei Mcelhiney,CHRIS, PTA 02/25/2021, 5:44 PM  [facility] [place], [place], [address] Phone: [phone]   Fax:  [phone]  Name: EVONY REZEK MRN: [id number] Date of Birth: [birth year]

## 2021-03-02 ENCOUNTER — Ambulatory Visit: Payer: No Typology Code available for payment source | Admitting: Sports Medicine

## 2021-03-09 ENCOUNTER — Ambulatory Visit: Payer: No Typology Code available for payment source | Admitting: Sports Medicine

## 2021-03-16 ENCOUNTER — Other Ambulatory Visit: Payer: Self-pay

## 2021-03-16 ENCOUNTER — Ambulatory Visit (INDEPENDENT_AMBULATORY_CARE_PROVIDER_SITE_OTHER): Payer: No Typology Code available for payment source | Admitting: Sports Medicine

## 2021-03-16 VITALS — BP 128/84 | Ht 62.0 in | Wt 130.0 lb

## 2021-03-16 DIAGNOSIS — S83221D Peripheral tear of medial meniscus, current injury, right knee, subsequent encounter: Secondary | ICD-10-CM | POA: Diagnosis not present

## 2021-03-16 NOTE — Patient Instructions (Signed)
We are going to refer you to Dr. Griffin Basil for your right knee.  [place], [place]  Appt: Tuesday 03/23/21 @ 10:30 am, please arrive at 10:15 am.

## 2021-03-17 NOTE — Progress Notes (Signed)
Patient ID: Helen Rice, female   DOB: 01/01/1976, 45 y.o.   MRN: [id number]  Helen Rice presents today for follow-up on right knee pain.  Unfortunately she continues to struggle despite a recent cortisone injection and formal physical therapy.  Injection did help but has not been curative.  Her pain and swelling continue to interfere with her activities of daily living.  Previous MRI showed a small undersurface tear of the posterior horn of the medial meniscus but was otherwise unremarkable.  Physical exam was not repeated today.  We simply talked about her ongoing knee pain.  Since she has failed to improve with conservative therapy over the past several weeks, I think it is reasonable for her to discuss possible arthroscopy with an orthopedist.  I will refer her to Dr. Griffin Basil to discuss this further.  Patient is in agreement with that plan.  I will defer further work-up and treatment to the discretion of Dr. Griffin Basil and the patient will follow up with me as needed.

## 2021-08-25 ENCOUNTER — Telehealth: Payer: Self-pay

## 2021-08-25 ENCOUNTER — Other Ambulatory Visit: Payer: Self-pay

## 2021-08-25 ENCOUNTER — Other Ambulatory Visit (HOSPITAL_COMMUNITY): Payer: Self-pay | Admitting: Family Medicine

## 2021-08-25 DIAGNOSIS — Z1231 Encounter for screening mammogram for malignant neoplasm of breast: Secondary | ICD-10-CM

## 2021-08-25 NOTE — Telephone Encounter (Signed)
Pt called and wanted a refill on her patches, she is scheduled for an appointment on 2/17 for an annual and med check. Please call pt if you can send patches or let us know if we need to sch before the 17th.

## 2021-08-26 MED ORDER — VIVELLE-DOT 0.1 MG/24HR TD PTTW: MEDICATED_PATCH | TRANSDERMAL | 1 refills | Status: DC

## 2021-09-24 ENCOUNTER — Ambulatory Visit (HOSPITAL_COMMUNITY): Payer: No Typology Code available for payment source

## 2021-09-24 ENCOUNTER — Ambulatory Visit: Payer: No Typology Code available for payment source | Admitting: Obstetrics & Gynecology

## 2021-10-06 ENCOUNTER — Encounter: Payer: Self-pay | Admitting: Family Medicine

## 2021-10-06 ENCOUNTER — Ambulatory Visit (INDEPENDENT_AMBULATORY_CARE_PROVIDER_SITE_OTHER): Payer: No Typology Code available for payment source | Admitting: Family Medicine

## 2021-10-06 VITALS — BP 129/85 | HR 77 | Temp 98.2°F | Ht 62.0 in | Wt 123.2 lb

## 2021-10-06 DIAGNOSIS — L570 Actinic keratosis: Secondary | ICD-10-CM

## 2021-10-06 DIAGNOSIS — K641 Second degree hemorrhoids: Secondary | ICD-10-CM

## 2021-10-06 DIAGNOSIS — F418 Other specified anxiety disorders: Secondary | ICD-10-CM | POA: Diagnosis not present

## 2021-10-06 DIAGNOSIS — L609 Nail disorder, unspecified: Secondary | ICD-10-CM

## 2021-10-06 DIAGNOSIS — Z8249 Family history of ischemic heart disease and other diseases of the circulatory system: Secondary | ICD-10-CM

## 2021-10-06 MED ORDER — FLUOROURACIL 5 % EX CREA: TOPICAL_CREAM | Freq: Two times a day (BID) | CUTANEOUS | 2 refills | Status: DC

## 2021-10-06 MED ORDER — HYDROCORTISONE (PERIANAL) 2.5 % EX CREA: 1.0000 "application " | TOPICAL_CREAM | Freq: Two times a day (BID) | CUTANEOUS | 0 refills | Status: DC

## 2021-10-06 NOTE — Progress Notes (Signed)
Subjective: CC: Skin lesions PCP: [name], DO FKC:LEXN Helen Rice is Helen 46 y.o. female presenting to clinic today for:  1.  Skin lesions Patient reports that she has been having skin lesions which crossed and then she scratches off on the tip of her nose and on the bottom right lip.  There is Helen family history of skin cancer in her father and she worries about that.  She has been trying to be more vigilant about utilizing sunblock but admits that she does not reapply frequently  She also has Helen lesion on her right great toenail which has been present for about 6 months.  Denies any preceding injury.  It has not grown out it has remained in the same spot for the last 6 months  2.  Anxiety Patient reports she has ongoing situational anxiety that seem to be worse over the last couple of years and she has been working at home.  She has Helen great fear of getting in the car.  She admits to gripping the steering wheel very tightly because she is so anxious.  She does not leave the home very much and when she does it is typically her husband driving.  Even then she feels extremely anxious.  She is treated with Lexapro 10 mg daily but she wants to see if she can pursue additional interventions due to worsening anxiety  3.  Hemorrhoid Patient reports that she has Helen rectal hemorrhoid that is not painful.  She has only noticed Helen palpable bulge but has not experienced any bleeding.  She is under the care of gastroenterology for interval checkups as she has had precancerous polyps on previous colonoscopy.  She will be getting in touch with her gastroenterologist soon as she is due for colonoscopy.  4.  Family history of CAD Patient reports that both of her brothers both her older brother and her younger brother sustained MIs in December.  Her older brother is now 57 but was 90 when the MI occurred.  His first MI was in his mid 57s.  Her younger brother was 86 when he had his MI.  She reports both  maternal and paternal CAD.  She is not having any specific chest pains or change in exercise tolerance but worries about this, particularly since she is to undergo Helen knee surgery in the near future   ROS: Per HPI  Allergies  Allergen Reactions   Influenza Vaccines     Cannot take   Latex     rash   Elemental Sulfur Rash   Past Medical History:  Diagnosis Date   Fibromyalgia    GERD (gastroesophageal reflux disease) 05/30/2017   Lyme disease    being seen by [facility]   Psoriasis     Current Outpatient Medications:    Ascorbic Acid (VITAMIN C) 100 MG tablet, Take 100 mg by mouth daily., Disp: , Rfl:    azelastine (ASTELIN) 0.1 % nasal spray, Place 1 spray into both nostrils 2 (two) times daily., Disp: 30 mL, Rfl: 12   Cholecalciferol (VITAMIN D) 125 MCG (5000 UT) CAPS, Take by mouth., Disp: , Rfl:    conjugated estrogens (PREMARIN) vaginal cream, Place 1 Applicatorful vaginally daily., Disp: 42.5 g, Rfl: 12   diclofenac (VOLTAREN) 75 MG EC tablet, Take 1 tablet (75 mg total) by mouth 2 (two) times daily as needed for moderate pain (take with food and plenty of water)., Disp: 60 tablet, Rfl: 1   escitalopram (LEXAPRO)  10 MG tablet, Take 1 tablet (10 mg total) by mouth daily., Disp: 30 tablet, Rfl: 1   fluorouracil (EFUDEX) 5 % cream, Apply topically 2 (two) times daily. To lesions on face X9 weeks, Disp: 40 g, Rfl: 2   fluticasone (FLONASE) 50 MCG/ACT nasal spray, Place 2 sprays into both nostrils daily., Disp: , Rfl:    hydrocortisone (PROCTOSOL HC) 2.5 % rectal cream, Place 1 application rectally 2 (two) times daily. X3-5 days as needed for hemorrhoids, Disp: 30 g, Rfl: 0   omeprazole (PRILOSEC) 20 MG capsule, Take 20mg  PO up to BID prn stomach pain/ reflux, Disp: 60 capsule, Rfl: 0   VIVELLE-DOT 0.1 MG/24HR patch, APPLY 1 PATCH TOPICALLY TWICE Helen WEEK Strength: 0.1 MG/24HR, Disp: 8 patch, Rfl: 1 Social History   Socioeconomic History   Marital status: Married     Spouse name: Not on file   Number of children: 1   Years of education: GED   Highest education level: Not on file  Occupational History   Occupation: [employer]  Tobacco Use   Smoking status: Former    Packs/day: 1.50    Years: 15.00    Pack years: 22.50    Types: Cigarettes    Quit date: 2016    Years since quitting: 7.1   Smokeless tobacco: Never  Vaping Use   Vaping Use: Every day  Substance and Sexual Activity   Alcohol use: Yes    Comment: occ   Drug use: No   Sexual activity: Yes    Birth control/protection: Surgical  Other Topics Concern   Not on file  Social History Narrative   Right handed    Caffeine use: Drinks 1 cup coffee per day   1-3 sodas per day   Lives with husband   Social Determinants of Health   Financial Resource Strain: Not on file  Food Insecurity: Not on file  Transportation Needs: Not on file  Physical Activity: Not on file  Stress: Not on file  Social Connections: Not on file  Intimate Partner Violence: Not on file   Family History  Problem Relation Age of Onset   Cancer Father    Psoriasis Father    Autoimmune disease Father    Breast cancer Sister    Heart attack Brother 50       had Helen second MI which required quad bypass 50yr   Heart attack Brother 110       MI w/ stent   Thyroid disease Maternal Aunt    Heart disease Maternal Grandmother    Heart disease Maternal Grandfather    Ovarian cancer Paternal Grandmother 27   Ovarian cancer Paternal Grandfather     Objective: Office vital signs reviewed. BP 129/85    Pulse 77    Temp 98.2 F (36.8 C)    Ht 5\' 2"  (1.575 m)    Wt 123 lb 3.2 oz (55.9 kg)    SpO2 100%    BMI 22.53 kg/m   Physical Examination:  General: Awake, alert, well appearing female, No acute distress HEENT: Crusty, slightly erythematous lesions noted on bilateral aspects of the tip of the nose.  She has Helen raised lesion on the bottom right lip with irregular borders and flaking Cardio: regular rate and rhythm, S1S2  heard, no murmurs appreciated Pulm: clear to auscultation bilaterally, no wheezes, rhonchi or rales; normal work of breathing on room air Extremities: Right great toenail with Helen horizontal striation in the nail that is pigmented and dark brown  Assessment/ Plan: 46 y.o. female   Actinic keratoses - Plan: fluorouracil (EFUDEX) 5 % cream, Ambulatory referral to Dermatology  Nail lesion - Plan: Ambulatory referral to Dermatology  Grade II hemorrhoids - Plan: hydrocortisone (PROCTOSOL HC) 2.5 % rectal cream  Situational anxiety - Plan: Ambulatory referral to Psychology  Family history of early CAD - Plan: CT CARDIAC SCORING (SELF PAY ONLY)  I am going to trial her on 5-FU and I have gone ahead and placed Helen referral to dermatology given strong family history of skin cancer in her father.  Quite concerned about the lesion on her great toe nail, particularly given its pigmentation.  Need to rule out melanoma in this area  For her hemorrhoids I have given her Proctosol to apply to the rectum as needed.  I think she needs CBT.  Referral to psychology placed.  She is on Lexapro and I am glad to raise it so should she desire but at this time she did not want to increase it from 10 mg  Given her very strong family history of CAD with 2 brothers having sustained an MI younger than 47 years old I think that she needs at least have some type of screening.  For this reason CT cardiac scoring has been ordered.  She understands that this will be Helen self-pay imaging study but should not be more than $99 through [place]  Orders Placed This Encounter  Procedures   CT CARDIAC SCORING (SELF PAY ONLY)    Standing Status:   Future    Standing Expiration Date:   10/07/2022    Order Specific Question:   Is patient pregnant?    Answer:   No    Order Specific Question:   Preferred imaging location?    Answer:   [facility]   Ambulatory referral to Dermatology    Referral Priority:   Routine     Referral Type:   Consultation    Referral Reason:   Specialty Services Required    Requested Specialty:   Dermatology    Number of Visits Requested:   1   Ambulatory referral to Psychology    Referral Priority:   Urgent    Referral Type:   Psychiatric    Referral Reason:   Specialty Services Required    Requested Specialty:   Psychology    Number of Visits Requested:   1   Meds ordered this encounter  Medications   fluorouracil (EFUDEX) 5 % cream    Sig: Apply topically 2 (two) times daily. To lesions on face X9 weeks    Dispense:  40 g    Refill:  2   hydrocortisone (PROCTOSOL HC) 2.5 % rectal cream    Sig: Place 1 application rectally 2 (two) times daily. X3-5 days as needed for hemorrhoids    Dispense:  30 g    Refill:  0     Jarret Torre Windell Moulding, DO [place] [phone]

## 2021-10-07 ENCOUNTER — Other Ambulatory Visit: Payer: No Typology Code available for payment source

## 2021-10-07 DIAGNOSIS — L570 Actinic keratosis: Secondary | ICD-10-CM

## 2021-10-07 DIAGNOSIS — Z8249 Family history of ischemic heart disease and other diseases of the circulatory system: Secondary | ICD-10-CM

## 2021-10-07 DIAGNOSIS — L609 Nail disorder, unspecified: Secondary | ICD-10-CM

## 2021-10-07 DIAGNOSIS — K641 Second degree hemorrhoids: Secondary | ICD-10-CM

## 2021-10-07 LAB — LIPID PANEL

## 2021-10-07 NOTE — Addendum Note (Signed)
Addended by: [name] on: 10/07/2021 08:49 AM ? ? Modules accepted: Orders ? ?

## 2021-10-08 LAB — CMP14+EGFR
ALT: 11 IU/L (ref 0–32)
AST: 16 IU/L (ref 0–40)
Albumin/Globulin Ratio: 2.4 — ABNORMAL HIGH (ref 1.2–2.2)
Albumin: 4.7 g/dL (ref 3.8–4.8)
Alkaline Phosphatase: 106 IU/L (ref 44–121)
BUN/Creatinine Ratio: 21 (ref 9–23)
BUN: 16 mg/dL (ref 6–24)
Bilirubin Total: 0.2 mg/dL (ref 0.0–1.2)
CO2: 22 mmol/L (ref 20–29)
Calcium: 9.8 mg/dL (ref 8.7–10.2)
Chloride: 106 mmol/L (ref 96–106)
Creatinine, Ser: 0.76 mg/dL (ref 0.57–1.00)
Globulin, Total: 2 g/dL (ref 1.5–4.5)
Glucose: 91 mg/dL (ref 70–99)
Potassium: 4.4 mmol/L (ref 3.5–5.2)
Sodium: 144 mmol/L (ref 134–144)
Total Protein: 6.7 g/dL (ref 6.0–8.5)
eGFR: 97 mL/min/{1.73_m2} (ref >59)

## 2021-10-08 LAB — CBC
Hematocrit: 38.4 % (ref 34.0–46.6)
Hemoglobin: 13 g/dL (ref 11.1–15.9)
MCH: 30.2 pg (ref 26.6–33.0)
MCHC: 33.9 g/dL (ref 31.5–35.7)
MCV: 89 fL (ref 79–97)
Platelets: 259 10*3/uL (ref 150–450)
RBC: 4.3 x10E6/uL (ref 3.77–5.28)
RDW: 12.7 % (ref 11.7–15.4)
WBC: 4.8 10*3/uL (ref 3.4–10.8)

## 2021-10-08 LAB — LIPID PANEL
Chol/HDL Ratio: 3.7 ratio (ref 0.0–4.4)
Cholesterol, Total: 179 mg/dL (ref 100–199)
HDL: 49 mg/dL (ref >39)
LDL Chol Calc (NIH): 109 mg/dL — ABNORMAL HIGH (ref 0–99)
Triglycerides: 114 mg/dL (ref 0–149)
VLDL Cholesterol Cal: 21 mg/dL (ref 5–40)

## 2021-10-08 LAB — TSH: TSH: 2.13 u[IU]/mL (ref 0.450–4.500)

## 2021-11-02 ENCOUNTER — Encounter (HOSPITAL_COMMUNITY): Payer: Self-pay

## 2021-11-02 ENCOUNTER — Ambulatory Visit (INDEPENDENT_AMBULATORY_CARE_PROVIDER_SITE_OTHER): Payer: Self-pay | Admitting: Clinical

## 2021-11-02 ENCOUNTER — Other Ambulatory Visit: Payer: Self-pay

## 2021-11-02 DIAGNOSIS — F331 Major depressive disorder, recurrent, moderate: Secondary | ICD-10-CM

## 2021-11-02 DIAGNOSIS — F419 Anxiety disorder, unspecified: Secondary | ICD-10-CM

## 2021-11-02 NOTE — Plan of Care (Signed)
Verbal consent  

## 2021-11-02 NOTE — Progress Notes (Signed)
Virtual Visit via Video Note ? ?I connected with Helen Rice on 11/02/21 at  8:00 AM EDT by a video enabled telemedicine application and verified that I am speaking with the correct person using two identifiers. ? ?Location: ?Patient: Home ?Provider: Office ?  ?I discussed the limitations of evaluation and management by telemedicine and the availability of in person appointments. The patient expressed understanding and agreed to proceed. ? ? ? ? ?Comprehensive Clinical Assessment (CCA) Note ? ?11/02/2021 ?Helen Rice ?[id number] ? ?Chief Complaint: Depression and Anxiety ?Visit Diagnosis: Recurrent Moderate Major Depression with Anxiety ? ? ?CCA Screening, Triage and Referral (STR) ? ?Patient Reported Information ?How did you hear about [place]? No data recorded ?Referral name: No data recorded ?Referral phone number: No data recorded ? ?Whom do you see for routine medical problems? No data recorded ?Practice/Facility Name: No data recorded ?Practice/Facility Phone Number: No data recorded ?Name of Contact: No data recorded ?Contact Number: No data recorded ?Contact Fax Number: No data recorded ?Prescriber Name: No data recorded ?Prescriber Address (if known): No data recorded ? ?What Is the Reason for Your Visit/Call Today? No data recorded ?How Long Has This Been Causing You Problems? No data recorded ?What Do You Feel Would Help You the Most Today? No data recorded ? ?Have You Recently Been in Any Inpatient Treatment (Hospital/Detox/Crisis Center/28-Day Program)? No data recorded ?Name/Location of Program/Hospital:No data recorded ?How Long Were You There? No data recorded ?When Were You Discharged? No data recorded ? ?Have You Ever Received Services From [facility] Before? No data recorded ?Who Do You See at [facility]? No data recorded ? ?Have You Recently Had Any Thoughts About Hurting Yourself? No data recorded ?Are You Planning to Commit Suicide/Harm Yourself At This time? No data recorded ? ?Have you  Recently Had Thoughts About [place]? No data recorded ?Explanation: No data recorded ? ?Have You Used Any Alcohol or Drugs in the Past 24 Hours? No data recorded ?How Long Ago Did You Use Drugs or Alcohol? No data recorded ?What Did You Use and How Much? No data recorded ? ?Do You Currently Have a Therapist/Psychiatrist? No data recorded ?Name of Therapist/Psychiatrist: No data recorded ? ?Have You Been Recently Discharged From Any Office Practice or Programs? No data recorded ?Explanation of Discharge From Practice/Program: No data recorded ? ?  ?CCA Screening Triage Referral Assessment ?Type of Contact: No data recorded ?Is this Initial or Reassessment? No data recorded ?Date Telepsych consult ordered in [facility]:  No data recorded ?Time Telepsych consult ordered in [facility]:  No data recorded ? ?Patient Reported Information Reviewed? No data recorded ?Patient Left Without Being Seen? No data recorded ?Reason for Not Completing Assessment: No data recorded ? ?Collateral Involvement: No data recorded ? ?Does Patient Have a Stage manager Guardian? No data recorded ?Name and Contact of Legal Guardian: No data recorded ?If Minor and Not Living with Parent(s), Who has Custody? No data recorded ?Is CPS involved or ever been involved? No data recorded ?Is APS involved or ever been involved? No data recorded ? ?Patient Determined To Be At Risk for Harm To Self or Others Based on Review of Patient Reported Information or Presenting Complaint? No data recorded ?Method: No data recorded ?Availability of Means: No data recorded ?Intent: No data recorded ?Notification Required: No data recorded ?Additional Information for Danger to Others Potential: No data recorded ?Additional Comments for Danger to Others Potential: No data recorded ?Are There Guns or Other Weapons in [place]? No  data recorded ?Types of Guns/Weapons: No data recorded ?Are These Weapons Safely Secured?                            No data  recorded ?Who Could Verify You Are Able To Have These Secured: No data recorded ?Do You Have any Outstanding Charges, Pending Court Dates, Parole/Probation? No data recorded ?Contacted To Inform of Risk of Harm To Self or Others: No data recorded ? ?Location of Assessment: No data recorded ? ?Does Patient Present under Involuntary Commitment? No data recorded ?IVC Papers Initial File Date: No data recorded ? ?[place] of Residence: No data recorded ? ?Patient Currently Receiving the Following Services: No data recorded ? ?Determination of Need: No data recorded ? ?Options For Referral: No data recorded ? ? ? ?CCA Biopsychosocial ?Intake/Chief Complaint:  The patient was reffered by her PCP at [facility] for Anxiety ? ?Current Symptoms/Problems: The patient notes difficulty with Anxiety and identified driving car or being on motorcycle as a trigger. The patient spoke about this getting worse over the past 5 years. ? ? ?Patient Reported Schizophrenia/Schizoaffective Diagnosis in Past: No ? ? ?Strengths: The patient notes she does everything in general well ? ?Preferences: Baking cakes, cleaning, watching TV, ? ?Abilities: Baking, Yoga, Kickboxing ? ? ?Type of Services Patient Feels are Needed: Individual Therapy ? ? ?Initial Clinical Notes/Concerns: The patient notes she has been vaping and uses this as a crutch to manage her Anxiety, but she is working to stop smoking. The patient notes that she was admitted for Inpatient at [facility] for Depression but that she noted was over 10years ago. ? ? ?Mental Health Symptoms ?Depression:   ?Change in energy/activity; Difficulty Concentrating; Fatigue; Irritability; Sleep (too much or little); Weight gain/loss; Hopelessness ?  ?Duration of Depressive symptoms:  ?Greater than two weeks ?  ?Mania:   ?None ?  ?Anxiety:    ?Difficulty concentrating; Fatigue; Irritability; Sleep; Restlessness; Tension; Worrying ?  ?Psychosis:   ?None ?  ?Duration of Psychotic symptoms: NA   ?Trauma:   ?None ?  ?Obsessions:   ?None ?  ?Compulsions:   ?None ?  ?Inattention:   ?None ?  ?Hyperactivity/Impulsivity:   ?None ?  ?Oppositional/Defiant Behaviors:   ?None ?  ?Emotional Irregularity:   ?None ?  ?Other Mood/Personality Symptoms:   ?N/A ?  ? ?Mental Status Exam ?Appearance and self-care  ?Stature:   ?Small ?  ?Weight:   ?Average weight ?  ?Clothing:   ?Casual ?  ?Grooming:   ?Normal ?  ?Cosmetic use:   ?Age appropriate ?  ?Posture/gait:   ?Normal ?  ?Motor activity:   ?Not Remarkable ?  ?Sensorium  ?Attention:   ?Distractible ?  ?Concentration:   ?Anxiety interferes ?  ?Orientation:   ?X5 ?  ?Recall/memory:   ?Defective in Short-term ?  ?Affect and Mood  ?Affect:   ?Appropriate ?  ?Mood:   ?Anxious; Depressed ?  ?Relating  ?Eye contact:   ?Normal ?  ?Facial expression:   ?Anxious; Responsive ?  ?Attitude toward examiner:   ?Cooperative ?  ?Thought and Language  ?Speech flow:  ?Normal ?  ?Thought content:   ?Appropriate to Mood and Circumstances ?  ?Preoccupation:   ?None ?  ?Hallucinations:   ?None ?  ?Organization:  Logical  ?Community education officer  ?Fund of Knowledge:   ?Good ?  ?Intelligence:   ?Average ?  ?Abstraction:   ?Normal ?  ?Judgement:   ?Fair ?  ?Reality  Testing:   ?Realistic ?  ?Insight:   ?Good ?  ?Decision Making:   ?Normal ?  ?Social Functioning  ?Social Maturity:   ?Responsible ?  ?Social Judgement:   ?Normal ?  ?Stress  ?Stressors:   ?Grief/losses; Illness; Relationship (Husbands grandmother passed within the last year,) ?  ?Coping Ability:   ?Normal ?  ?Skill Deficits:   ?None ?  ?Supports:   ?Family (Husband and friends) ?  ? ? ?Religion: ?Religion/Spirituality ?Are You A Religious Person?: No ?How Might This Affect Treatment?: Na ? ?Leisure/Recreation: ?Leisure / Recreation ?Do You Have Hobbies?: Yes ?Leisure and Hobbies: Yoga, Kickboxing, and [employer] ? ?Exercise/Diet: ?Exercise/Diet ?Do You Exercise?: Yes ?What Type of Exercise Do You Do?: Other (Comment) (Yoga and  Kickboxing) ?How Many Times a Week Do You Exercise?: 1-3 times a week ?Have You Gained or Lost A Significant Amount of Weight in the Past Six Months?: Yes-Lost ?Number of Pounds Lost?: 10 ?Do You Follow a Special Diet?: No ?Do Yo

## 2021-11-16 ENCOUNTER — Ambulatory Visit (HOSPITAL_COMMUNITY)
Admission: RE | Admit: 2021-11-16 | Discharge: 2021-11-16 | Disposition: A | Discharge status: Home / Self Care | Payer: No Typology Code available for payment source | Source: Ambulatory Visit | Attending: Family Medicine | Admitting: Family Medicine

## 2021-11-16 DIAGNOSIS — Z8249 Family history of ischemic heart disease and other diseases of the circulatory system: Secondary | ICD-10-CM | POA: Insufficient documentation

## 2021-11-17 ENCOUNTER — Encounter: Payer: Self-pay | Admitting: Family Medicine

## 2021-12-02 ENCOUNTER — Ambulatory Visit (INDEPENDENT_AMBULATORY_CARE_PROVIDER_SITE_OTHER): Payer: Self-pay | Admitting: Clinical

## 2021-12-02 DIAGNOSIS — F331 Major depressive disorder, recurrent, moderate: Secondary | ICD-10-CM

## 2021-12-02 DIAGNOSIS — F419 Anxiety disorder, unspecified: Secondary | ICD-10-CM

## 2021-12-02 NOTE — Progress Notes (Addendum)
Virtual Visit via Video Note  I connected with [name] on 12/02/21 at  2:00 PM EDT by a video enabled telemedicine application and verified that I am speaking with the correct person using two identifiers.  Location: Patient: Home Provider: Office   I discussed the limitations of evaluation and management by telemedicine and the availability of in person appointments. The patient expressed understanding and agreed to proceed.  THERAPIST PROGRESS NOTE   Session Time: 2:00 PM-2:55 PM   Participation Level: Active   Behavioral Response: CasualAlertDepressedAnxious   Type of Therapy: Individual Therapy   Treatment Goals addressed: Coping   Interventions: CBT, Motivational Interviewing, Strength-based and Supportive   Summary: Helen Rice is a 46 y.o. female who presents with Depression and Anxiety.The OPT therapist worked with the patient for her outpatient. OPT treatment. The OPT therapist utilized Motivational Interviewing to assist in creating therapeutic repore. The patient in the session was engaged and work in collaboration giving feedback about her triggers and symptoms over the past few weeks. The patient spoke about difficulty with management of symptoms and the impact of isolation on her social group and social interaction. The patient spoke about working on managing her anxiety and trying to drive more.The OPT therapist placed emphasis on the patients focusing on her own self care areas and creating some self time/ breaks in her week potentially outside of the home to assist the patient with managing the stress. The OPT therapist utilized Cognitive Behavioral Therapy through cognitive restructuring as well as worked with the patient on self awareness and implementing coping strategies to assist in management current stressors.The OPT therapist provided ongoing psychoeducation  and support throughout the session with the patient.     Suicidal/Homicidal: Nowithout  intent/plan   Therapist Response: The OPT therapist worked with the patient for the patients scheduled session. The patient was engaged in her session and gave feedback in relation to triggers, symptoms, and behavior responses over the past few weeks including the impact of her symptoms on her social interaction and social group involvement.The OPT therapist worked with the patient utilizing an in session Cognitive Behavioral Therapy exercise. The patient was responsive in the session and verbalized, " My friend group has been distancing themself and I don't know how to deal with it".  The OPT therapist worked with the patient to examine if the distance was created by the patient self isolating versus being excluded.The OPT therapist  continued to work with the patient on communicating with her friends and working to repair the social relationship and manage the impact of "drama" within the social group.. The OPT therapist will continue treatment work with the patient in her next scheduled session   Plan: Return again in 2/3 weeks.   Diagnosis:      Axis I:  Recurrent moderate major depressive disorder with anxiety                         Axis II: No diagnosis       Collaboration of Care: No additional collaboration for this session   Patient/Guardian was advised Release of Information must be obtained prior to any record release in order to collaborate their care with an outside provider. Patient/Guardian was advised if they have not already done so to contact the registration department to sign all necessary forms in order for [place] to release information regarding their care.    Consent: Patient/Guardian gives verbal consent for treatment and assignment of  benefits for services provided during this visit. Patient/Guardian expressed understanding and agreed to proceed.   I discussed the assessment and treatment plan with the patient. The patient was provided an opportunity to ask questions and all  were answered. The patient agreed with the plan and demonstrated an understanding of the instructions.   The patient was advised to call back or seek an in-person evaluation if the symptoms worsen or if the condition fails to improve as anticipated.   I provided 55 minutes of non-face-to-face time during this encounter.     Lennox Grumbles, LCSW   12/02/2021

## 2021-12-08 ENCOUNTER — Inpatient Hospital Stay: Admission: RE | Admit: 2021-12-08 | Payer: No Typology Code available for payment source | Source: Ambulatory Visit

## 2021-12-30 ENCOUNTER — Ambulatory Visit (INDEPENDENT_AMBULATORY_CARE_PROVIDER_SITE_OTHER): Payer: Self-pay | Admitting: Clinical

## 2021-12-30 ENCOUNTER — Telehealth: Payer: Self-pay | Admitting: Family Medicine

## 2021-12-30 DIAGNOSIS — F331 Major depressive disorder, recurrent, moderate: Secondary | ICD-10-CM

## 2021-12-30 DIAGNOSIS — F419 Anxiety disorder, unspecified: Secondary | ICD-10-CM

## 2021-12-30 DIAGNOSIS — F410 Panic disorder [episodic paroxysmal anxiety] without agoraphobia: Secondary | ICD-10-CM

## 2021-12-30 MED ORDER — HYDROXYZINE PAMOATE 25 MG PO CAPS: 25.0000 mg | ORAL_CAPSULE | Freq: Three times a day (TID) | ORAL | 0 refills | Status: DC | PRN

## 2021-12-30 NOTE — Telephone Encounter (Signed)
She Just got off phone with therapist, over the weekend she has a really bad anxiety attack that lasted about 45 minutes. Her therapist is wondering if you can prescribe something as needed for anxiety. Doesn't want lexapro or anything that she would have withdraws from. Wants to see if you can prescribe now so she can do trial before she sees you on the 3nd of June so she ca have an update for you at that appointment

## 2021-12-30 NOTE — Telephone Encounter (Signed)
Patient would like to talk to nurse about her therapy. No other information given. Please call back.

## 2021-12-30 NOTE — Progress Notes (Signed)
Virtual Visit via Video Note   I connected with Helen Rice on 12/30/21 at  2:00 PM EDT by a video enabled telemedicine application and verified that I am speaking with the correct person using two identifiers.   Location: Patient: Home Provider: Office   I discussed the limitations of evaluation and management by telemedicine and the availability of in person appointments. The patient expressed understanding and agreed to proceed.   THERAPIST PROGRESS NOTE   Session Time: 2:00 PM-2:55 PM   Participation Level: Active   Behavioral Response: CasualAlertDepressedAnxious   Type of Therapy: Individual Therapy   Treatment Goals addressed: Coping   Interventions: CBT, Motivational Interviewing, Strength-based and Supportive   Summary: Helen Rice is a 46 y.o. female who presents with Depression and Anxiety.The OPT therapist worked with the patient for her outpatient. OPT treatment. The OPT therapist utilized Motivational Interviewing to assist in creating therapeutic repore. The patient in the session was engaged and work in collaboration giving feedback about her triggers and symptoms over the past few weeks. The patient spoke about difficulty with management of symptoms and the impact of driving and social interaction. The patient spoke about going to meet friends at a winery in [place] and the driving there was triggering strongly as well as spoke about the physiological way her body responded to the anxiety. The OPT therapist utilized Cognitive Behavioral Therapy through cognitive restructuring as well as worked with the patient on self awareness and implementing coping strategies to assist in management current stressors.The OPT therapist provided ongoing psychoeducation  and support throughout the session with the patient.     Suicidal/Homicidal: Nowithout intent/plan   Therapist Response: The OPT therapist worked with the patient for the patients scheduled session. The patient was  engaged in her session and gave feedback in relation to triggers, symptoms, and behavior responses over the past few weeks including the impact of her symptoms on her social interaction and social group involvement.The OPT therapist worked with the patient utilizing an in session Cognitive Behavioral Therapy exercise. The patient was responsive in the session and verbalized, " After the anxiety event on Sunday I had decided I am going to get some medication for the Anxiety just as needed ".  The OPT therapist agreed that having a PRN anxiety medication prescribed based on the patients explanation of intensity could be helpful as part of her treatment.The patient verbalized her intent to speak with her PCP later today to request the PRN Anxiety Medication.. The OPT therapist will continue treatment work with the patient in her next scheduled session   Plan: Return again in 2/3 weeks.   Diagnosis:      Axis I:  Recurrent moderate major depressive disorder with anxiety                         Axis II: No diagnosis       Collaboration of Care: No additional collaboration for this session   Patient/Guardian was advised Release of Information must be obtained prior to any record release in order to collaborate their care with an outside provider. Patient/Guardian was advised if they have not already done so to contact the registration department to sign all necessary forms in order for [place] to release information regarding their care.    Consent: Patient/Guardian gives verbal consent for treatment and assignment of benefits for services provided during this visit. Patient/Guardian expressed understanding and agreed to proceed.   I discussed the assessment  and treatment plan with the patient. The patient was provided an opportunity to ask questions and all were answered. The patient agreed with the plan and demonstrated an understanding of the instructions.   The patient was advised to call back or seek an  in-person evaluation if the symptoms worsen or if the condition fails to improve as anticipated.   I provided 55 minutes of non-face-to-face time during this encounter.     Lennox Grumbles, LCSW   12/30/2021

## 2021-12-30 NOTE — Addendum Note (Signed)
Addended by: [name] on: 12/30/2021 03:31 PM   Modules accepted: Orders

## 2021-12-30 NOTE — Telephone Encounter (Signed)
Pt aware.

## 2022-01-07 ENCOUNTER — Other Ambulatory Visit (HOSPITAL_COMMUNITY)
Admission: RE | Admit: 2022-01-07 | Discharge: 2022-01-07 | Disposition: A | Discharge status: Home / Self Care | Payer: No Typology Code available for payment source | Source: Ambulatory Visit | Attending: Family Medicine | Admitting: Family Medicine

## 2022-01-07 ENCOUNTER — Ambulatory Visit (INDEPENDENT_AMBULATORY_CARE_PROVIDER_SITE_OTHER): Payer: No Typology Code available for payment source | Admitting: Family Medicine

## 2022-01-07 ENCOUNTER — Encounter: Payer: Self-pay | Admitting: Family Medicine

## 2022-01-07 VITALS — BP 127/77 | HR 79 | Temp 98.2°F | Ht 62.0 in | Wt 127.2 lb

## 2022-01-07 DIAGNOSIS — Z0001 Encounter for general adult medical examination with abnormal findings: Secondary | ICD-10-CM

## 2022-01-07 DIAGNOSIS — Z0000 Encounter for general adult medical examination without abnormal findings: Secondary | ICD-10-CM

## 2022-01-07 DIAGNOSIS — F418 Other specified anxiety disorders: Secondary | ICD-10-CM

## 2022-01-07 DIAGNOSIS — Z90710 Acquired absence of both cervix and uterus: Secondary | ICD-10-CM | POA: Diagnosis not present

## 2022-01-07 DIAGNOSIS — Z124 Encounter for screening for malignant neoplasm of cervix: Secondary | ICD-10-CM | POA: Insufficient documentation

## 2022-01-07 MED ORDER — VIVELLE-DOT 0.1 MG/24HR TD PTTW: MEDICATED_PATCH | TRANSDERMAL | 1 refills | Status: DC

## 2022-01-07 MED ORDER — HYDROXYZINE HCL 25 MG PO TABS: 12.5000 mg | ORAL_TABLET | Freq: Three times a day (TID) | ORAL | 1 refills | Status: DC | PRN

## 2022-01-07 NOTE — Progress Notes (Signed)
Helen Rice is a 46 y.o. female presents to office today for annual physical exam examination.    Concerns today include: 1. Breast pain Patient reports some left-sided breast pain both along the medial aspect where she had previous biopsy in along the 5 o'clock position near the rib cage.  These areas are tender sometimes to touch.  She tried to get her mammogram done but was told that she could not have it done until after the end of the month because the radiologist would not approve it despite her insurance covering it.  Does not report any nipple discharge.  Last eye exam: UTD Last dental exam: UTD Last colonoscopy: scheduled Last mammogram: due 01/2022 Last pap smear: has history of total hysterectomy in her 15s.  Currently treated with hormones both via patch and vaginally.  Refills needed today: vivelle dot Immunizations needed: Immunization History  Administered Date(s) Administered   Hepatitis B 08/20/2001, 09/27/2001   PFIZER(Purple Top)SARS-COV-2 Vaccination 11/14/2019, 12/12/2019   Tdap 07/02/2018     Past Medical History:  Diagnosis Date   Fibromyalgia    GERD (gastroesophageal reflux disease) 05/30/2017   Lyme disease    being seen by [facility]   Psoriasis    Social History   Socioeconomic History   Marital status: Married    Spouse name: Not on file   Number of children: 1   Years of education: GED   Highest education level: Not on file  Occupational History   Occupation: [employer]  Tobacco Use   Smoking status: Former    Packs/day: 1.50    Years: 15.00    Pack years: 22.50    Types: Cigarettes    Quit date: 2016    Years since quitting: 7.4   Smokeless tobacco: Never  Vaping Use   Vaping Use: Every day  Substance and Sexual Activity   Alcohol use: Yes    Comment: occ   Drug use: No   Sexual activity: Yes    Birth control/protection: Surgical  Other Topics Concern   Not on file  Social History Narrative   Right handed     Caffeine use: Drinks 1 cup coffee per day   1-3 sodas per day   Lives with husband   Social Determinants of Health   Financial Resource Strain: Not on file  Food Insecurity: Not on file  Transportation Needs: Not on file  Physical Activity: Not on file  Stress: Not on file  Social Connections: Not on file  Intimate Partner Violence: Not on file   Past Surgical History:  Procedure Laterality Date   ABDOMINAL HYSTERECTOMY     APPENDECTOMY     CESAREAN SECTION     CHOLECYSTECTOMY     COLONOSCOPY     upper/lower   GIVENS CAPSULE STUDY N/A 06/21/2017   Procedure: GIVENS CAPSULE STUDY;  Surgeon: Rogene Houston, MD;  Location: [facility];  Service: Endoscopy;  Laterality: N/A;   Family History  Problem Relation Age of Onset   Cancer Father    Psoriasis Father    Autoimmune disease Father    Breast cancer Sister    Heart attack Brother 38       had a second MI which required quad bypass 52yr  Heart attack Brother 479      MI w/ stent   Thyroid disease Maternal Aunt    Heart disease Maternal Grandmother    Heart disease Maternal Grandfather    Ovarian cancer Paternal Grandmother  35   Ovarian cancer Paternal Grandfather     Current Outpatient Medications:    Ascorbic Acid (VITAMIN C) 100 MG tablet, Take 100 mg by mouth daily., Disp: , Rfl:    azelastine (ASTELIN) 0.1 % nasal spray, Place 1 spray into both nostrils 2 (two) times daily., Disp: 30 mL, Rfl: 12   Cholecalciferol (VITAMIN D) 125 MCG (5000 UT) CAPS, Take by mouth., Disp: , Rfl:    conjugated estrogens (PREMARIN) vaginal cream, Place 1 Applicatorful vaginally daily., Disp: 42.5 g, Rfl: 12   diclofenac (VOLTAREN) 75 MG EC tablet, Take 1 tablet (75 mg total) by mouth 2 (two) times daily as needed for moderate pain (take with food and plenty of water)., Disp: 60 tablet, Rfl: 1   escitalopram (LEXAPRO) 10 MG tablet, Take 1 tablet (10 mg total) by mouth daily., Disp: 30 tablet, Rfl: 1   fluorouracil (EFUDEX) 5 %  cream, Apply topically 2 (two) times daily. To lesions on face X9 weeks, Disp: 40 g, Rfl: 2   fluticasone (FLONASE) 50 MCG/ACT nasal spray, Place 2 sprays into both nostrils daily., Disp: , Rfl:    hydrocortisone (PROCTOSOL HC) 2.5 % rectal cream, Place 1 application rectally 2 (two) times daily. X3-5 days as needed for hemorrhoids, Disp: 30 g, Rfl: 0   hydrOXYzine (VISTARIL) 25 MG capsule, Take 1 capsule (25 mg total) by mouth every 8 (eight) hours as needed (panic attack)., Disp: 30 capsule, Rfl: 0   omeprazole (PRILOSEC) 20 MG capsule, Take '20mg'$  PO up to BID prn stomach pain/ reflux, Disp: 60 capsule, Rfl: 0   VIVELLE-DOT 0.1 MG/24HR patch, APPLY 1 PATCH TOPICALLY TWICE A WEEK Strength: 0.1 MG/24HR, Disp: 8 patch, Rfl: 1  Allergies  Allergen Reactions   Influenza Vaccines     Cannot take   Latex     rash   Elemental Sulfur Rash     ROS: Review of Systems Pertinent items noted in HPI and remainder of comprehensive ROS otherwise negative.    Physical exam BP 127/77   Pulse 79   Temp 98.2 F (36.8 C)   Ht '5\' 2"'$  (1.575 m)   Wt 127 lb 3.2 oz (57.7 kg)   SpO2 95%   BMI 23.27 kg/m  General appearance: alert, cooperative, appears stated age, and no distress Head: Normocephalic, without obvious abnormality, atraumatic Eyes: negative findings: lids and lashes normal, conjunctivae and sclerae normal, corneas clear, and pupils equal, round, reactive to light and accomodation Ears: normal TM's and external ear canals both ears Nose: Nares normal. Septum midline. Mucosa normal. No drainage or sinus tenderness. Throat: lips, mucosa, and tongue normal; teeth and gums normal Neck: no adenopathy, no carotid bruit, supple, symmetrical, trachea midline, and thyroid not enlarged, symmetric, no tenderness/mass/nodules Back: symmetric, no curvature. ROM normal. No CVA tenderness. Lungs: clear to auscultation bilaterally Breasts: Inspection negative, No nipple retraction or dimpling, No nipple  discharge or bleeding Heart: regular rate and rhythm, S1, S2 normal, no murmur, click, rub or gallop Abdomen:  mild generalized TTP Pelvic: external genitalia normal, rectovaginal septum normal, vagina normal without discharge, and uterus and cervix surgically absent.  No apparent lesions within the vaginal vault.  No discharge Extremities: extremities normal, atraumatic, no cyanosis or edema Pulses: 2+ and symmetric Skin: Skin color, texture, turgor normal.  Small, rounded flesh-colored lesion noted at the right upper lip just underneath the right nare.  Multiple tattoos along the upper extremities bilaterally Lymph nodes: Cervical, supraclavicular, and axillary nodes normal. Neurologic: Grossly normal  01/07/2022    3:40 PM 11/02/2021    8:30 AM 10/06/2021    3:41 PM  Depression screen PHQ 2/9  Decreased Interest 0  1  Down, Depressed, Hopeless 0  1  PHQ - 2 Score 0  2  Altered sleeping   1  Tired, decreased energy   3  Change in appetite   1  Feeling bad or failure about yourself    0  Trouble concentrating   1  Moving slowly or fidgety/restless   0  Suicidal thoughts   0  PHQ-9 Score   8  Difficult doing work/chores   Somewhat difficult     Information is confidential and restricted. Go to Review Flowsheets to unlock data.     Assessment/ Plan: Helen Rice here for annual physical exam.   Annual physical exam  Screening for malignant neoplasm of cervix - Plan: Cytology - PAP([place])  Situational anxiety - Plan: hydrOXYzine (ATARAX) 25 MG tablet  History of total hysterectomy - Plan: VIVELLE-DOT 0.1 MG/24HR patch  We will obtain a Pap of the vaginal vault but there was no cervix to actually Pap given her history of total hysterectomy.  I have renewed her Vivelle-Dot and I will reach out to her OB/GYN with regards to ongoing recommendations for hormone treatment as she is being treated with both the Vivelle-Dot and a vaginal estrogen.  We discussed that she likely  would need at least some type of treatment until she is in her 57s when most women would go through the menopause.  I think this is probably required right now for bone health  Atarax working really well for as needed anxiety relief.  I am going to prescribe with a tablet which she can break in half if needed because the 25 mg is causing a little bit of sedation  Counseled on healthy lifestyle choices, including diet (rich in fruits, vegetables and lean meats and low in salt and simple carbohydrates) and exercise (at least 30 minutes of moderate physical activity daily).  Patient to follow up in 1 year for annual exam or sooner if needed.  Helen Rice M. Lajuana Ripple, DO

## 2022-01-11 ENCOUNTER — Telehealth: Payer: Self-pay | Admitting: Family Medicine

## 2022-01-11 ENCOUNTER — Other Ambulatory Visit: Payer: Self-pay | Admitting: Family Medicine

## 2022-01-11 DIAGNOSIS — Z90710 Acquired absence of both cervix and uterus: Secondary | ICD-10-CM

## 2022-01-11 LAB — CYTOLOGY - PAP
Adequacy: ABSENT
Comment: NEGATIVE
Diagnosis: NEGATIVE
High risk HPV: NEGATIVE

## 2022-01-11 MED ORDER — ESTRADIOL 0.1 MG/24HR TD PTWK: 0.1000 mg | MEDICATED_PATCH | TRANSDERMAL | 12 refills | Status: DC

## 2022-01-11 NOTE — Telephone Encounter (Signed)
Pt states insurance will not pay for patch, I made pt aware if insurance wont pay there is nothing we can do but send in alternative. Is there an alternative we can send in?

## 2022-01-11 NOTE — Telephone Encounter (Signed)
Pt states it does not have to use the patch- states she can also do the pill form if she needs to. Right now she will call insurance and see if they can give her alternatives or any other info.

## 2022-01-11 NOTE — Telephone Encounter (Signed)
There is no alternative patch that I know of.  The alternative estrogen is the vaginal estrogen she is using.  Maybe if she sees the GYN they can somehow work around that?  I know she was trying to avoid a GYN visit but she might have to to get the med

## 2022-01-11 NOTE — Telephone Encounter (Signed)
Pt aware.

## 2022-01-11 NOTE — Telephone Encounter (Signed)
PT TALKED TO INSURANCE COMPANY AND IF YOU CAN SEND IN ESTRADIOL PATCH (CLIMARA) AND IT WILL BE COVERED

## 2022-01-11 NOTE — Telephone Encounter (Signed)
Pt called to let PCP know that she is being told from the pharmacist that her insurance no longer covers her VIVELLE-DOT 0.'1MG'$ /24 HR Patch.  Pt unsure if pharmacist is certain of this. Wants someone to look in to this for her and call her with update.

## 2022-01-11 NOTE — Telephone Encounter (Signed)
Climara sent. I assume this is equivalent to what she was on but AGAIN, I do not typically manage hormones and cannot be sure this is the correct management.  Would still encourage her to check in with OBgyn given that she is treated with 2 different methods of estrogen replacement

## 2022-01-31 ENCOUNTER — Ambulatory Visit
Admission: RE | Admit: 2022-01-31 | Discharge: 2022-01-31 | Disposition: A | Discharge status: Home / Self Care | Payer: No Typology Code available for payment source | Source: Ambulatory Visit | Attending: Family Medicine | Admitting: Family Medicine

## 2022-01-31 ENCOUNTER — Other Ambulatory Visit (HOSPITAL_COMMUNITY): Payer: Self-pay | Admitting: Family Medicine

## 2022-01-31 DIAGNOSIS — Z1231 Encounter for screening mammogram for malignant neoplasm of breast: Secondary | ICD-10-CM

## 2022-01-31 DIAGNOSIS — N644 Mastodynia: Secondary | ICD-10-CM

## 2022-02-09 IMAGING — US US BREAST*L* LIMITED INC AXILLA
1 series · 13 of 20 positions shown · non-contrast
Comparison: Previous exams.

CLINICAL DATA: Screening recall for possible left breast mass and
possible left breast distortion.

EXAM:
DIGITAL DIAGNOSTIC UNILATERAL LEFT MAMMOGRAM WITH TOMOSYNTHESIS AND
CAD; ULTRASOUND LEFT BREAST LIMITED
TECHNIQUE: Left digital diagnostic mammography and breast tomosynthesis was
performed. The images were evaluated with computer-aided detection.;
Targeted ultrasound examination of the left breast was performed

[Series 1: us breast*left* limited inc axilla · 0.07mm/px · 13 of 20 slices shown]
[im 1/20]
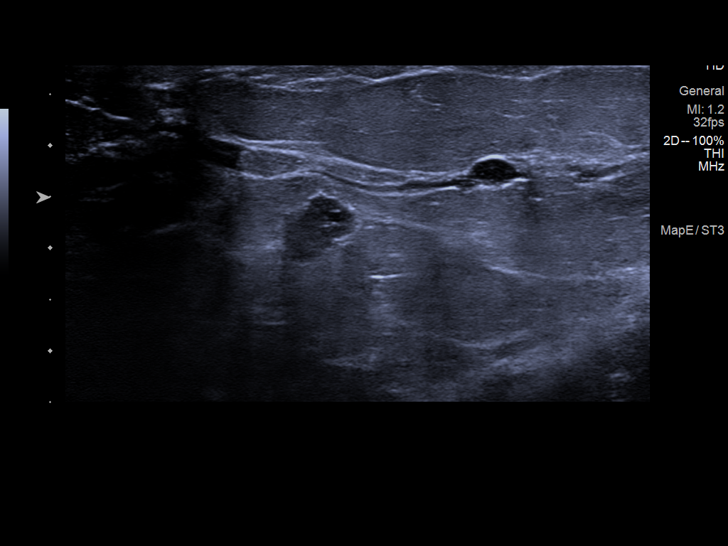
[im 3/20]
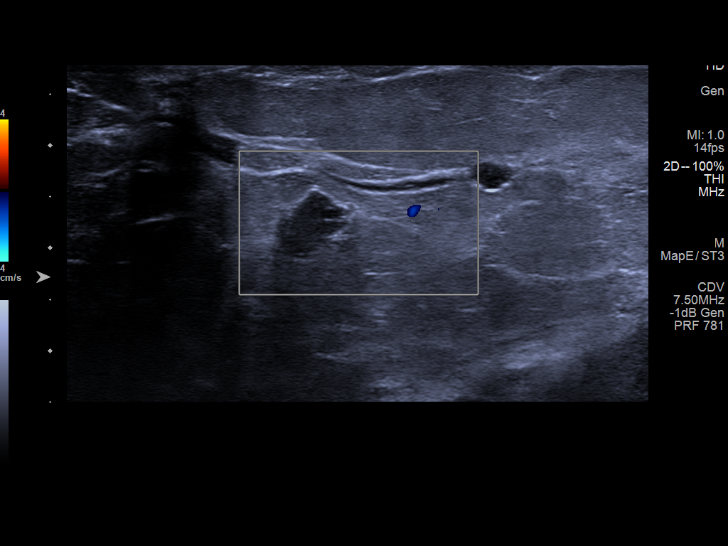
[im 4/20]
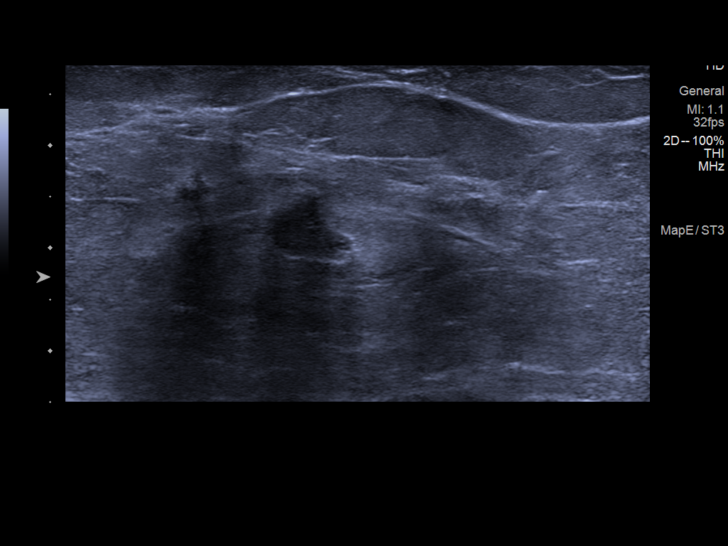
[im 6/20]
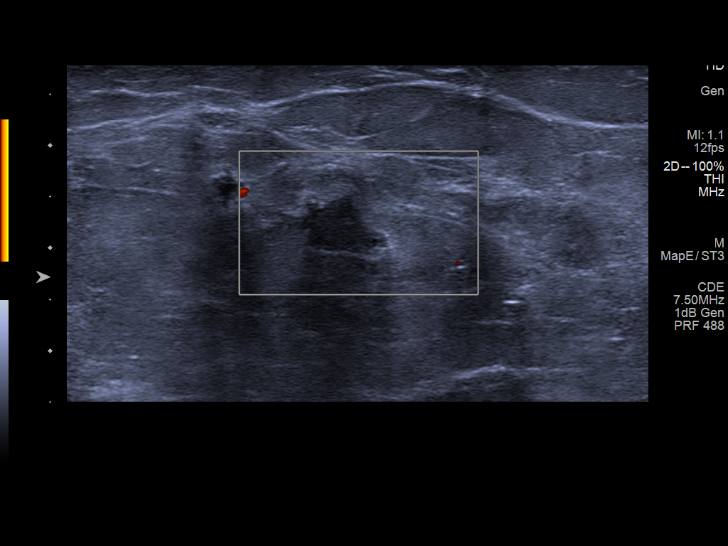
[im 7/20]
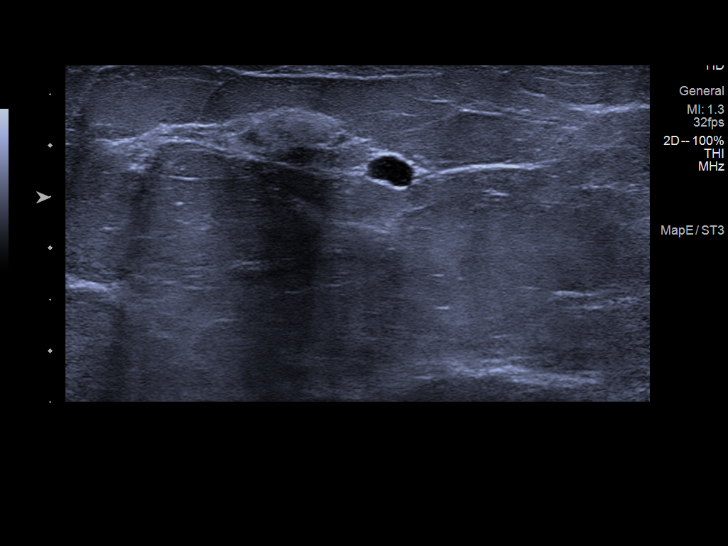
[im 9/20]
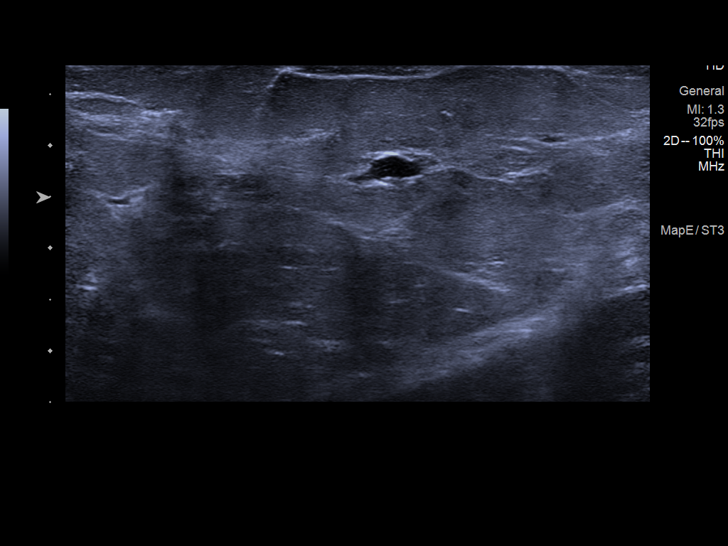
[im 11/20]
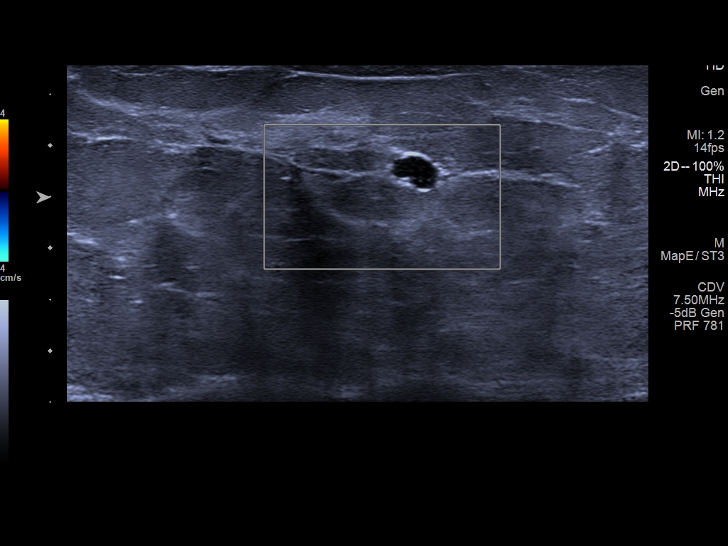
[im 12/20]
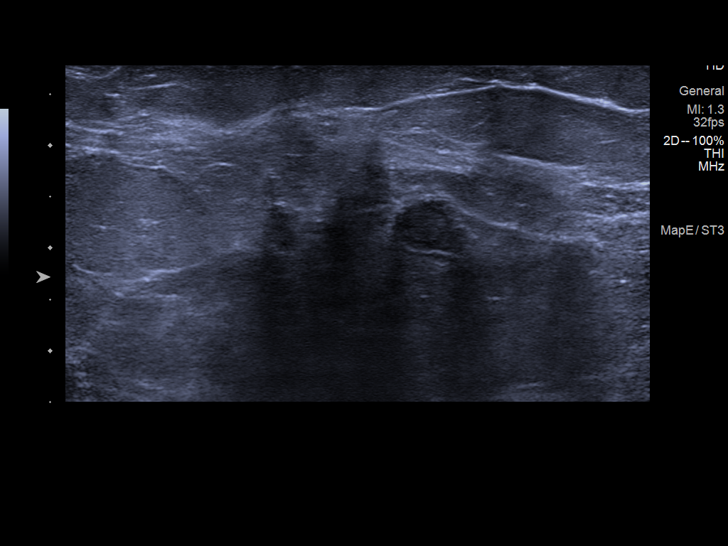
[im 14/20]
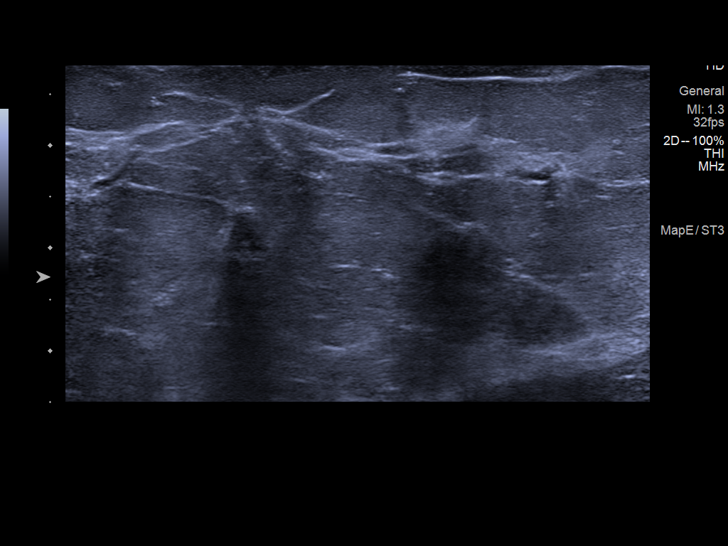
[im 15/20]
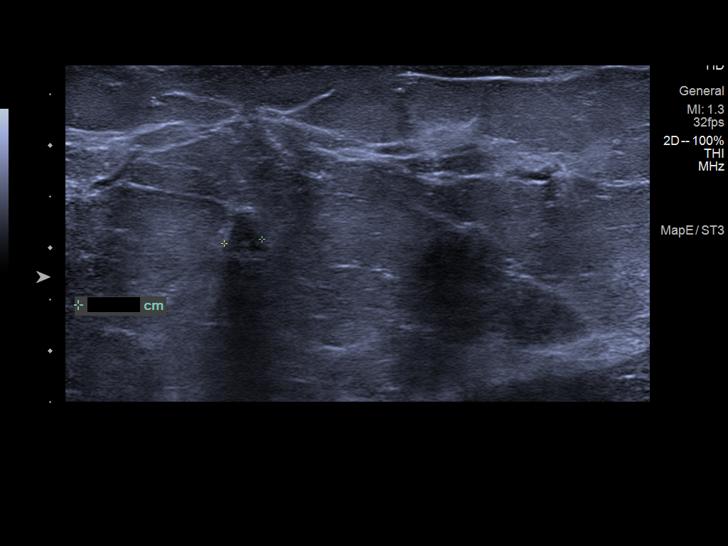
[im 17/20]
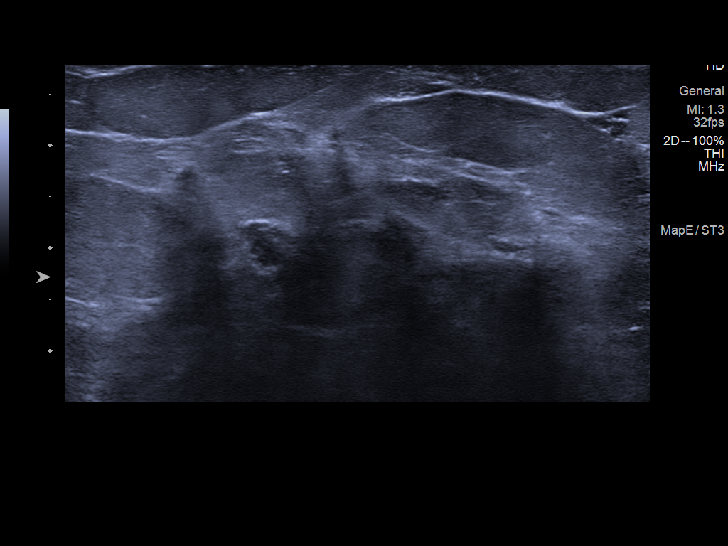
[im 18/20]
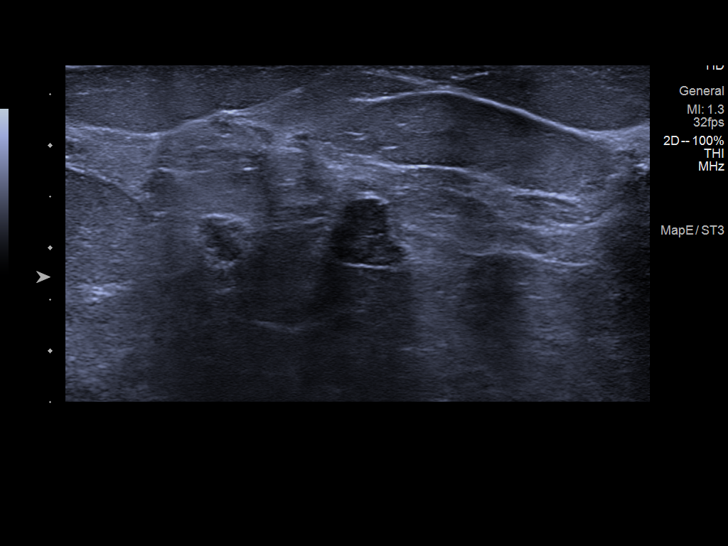
[im 20/20]
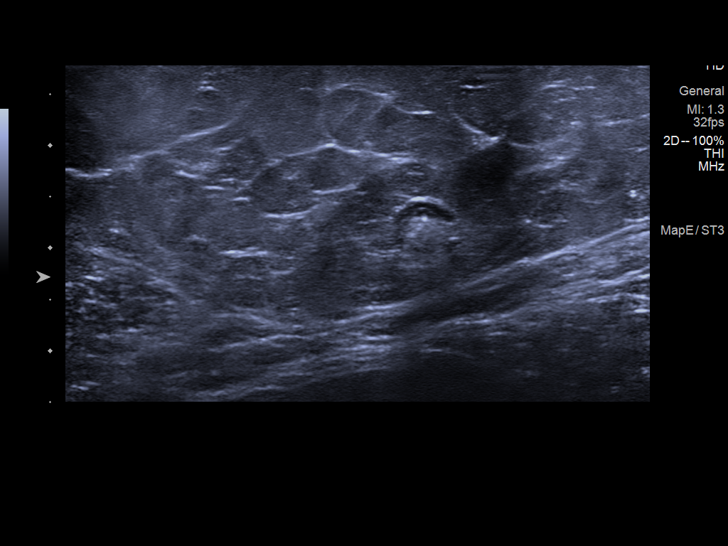

[13 of 20 positions shown; findings below may reference images not displayed]

ACR Breast Density Category b: There are scattered areas of
fibroglandular density.
FINDINGS: Additional tomograms of the left breast were performed. The
initially questioned possible left breast distortion resolves on the
additional imaging with findings compatible with an area of
overlapping fibroglandular tissue. There are 3 oval masses in the
central/retroareolar left breast, the largest of which measures
cm in the 2 adjacent masses measure 0.5 and 0.6 cm.

Targeted ultrasound of the left breast was performed. There are
several cysts identified in the left breast, with a cyst at 6
o'clock retroareolar measuring 0.5 x 0.3 x 0.5 cm. This is felt to
correspond with the smaller mass seen in the left breast at
mammography. There is an irregular hypoechoic mass in the left
breast at 6 o'clock retroareolar measuring 0.8 x 0.6 x 0.9 cm. This
corresponds well with the larger mass seen in the left breast at
mammography. There is an additional irregular hypoechoic mass also
at 6 o'clock retroareolar measuring 0.3 x 0.4 x 0.4 cm. This is felt
to correspond with 1 of the smaller mass is seen in the left breast
at mammography. No lymphadenopathy seen in the left axilla.
IMPRESSION: 1. Suspicious 0.9 cm mass in the left breast at 6 o'clock
retroareolar.

2. Additional indeterminate adjacent 0.4 cm mass in the left breast
at 6 o'clock retroareolar.

RECOMMENDATION:
Recommend ultrasound-guided core biopsy of the 2 masses in the left
breast at 6 o'clock retroareolar.

I have discussed the findings and recommendations with the patient.
If applicable, a reminder letter will be sent to the patient
regarding the next appointment.

BI-RADS CATEGORY  4: Suspicious.

## 2022-02-14 IMAGING — US US BREAST BX W LOC DEV 1ST LESION IMG BX SPEC US GUIDE*L*
2 series · 16 of 25 positions shown · non-contrast
Comparison: Previous exam(s).
COMPARISON: Previous exam(s).

Addendum:
CLINICAL DATA: 45-year-old female with 2 indeterminate masses in
the left breast at 6 o'clock subareolar.

EXAM:
ULTRASOUND GUIDED LEFT BREAST CORE NEEDLE BIOPSY

[Series 1: us breast bx w loc dev 1st lesion img bx spec us g · 0.07mm/px · 8 of 19 slices shown (1 of 2)]
[im 1/19]
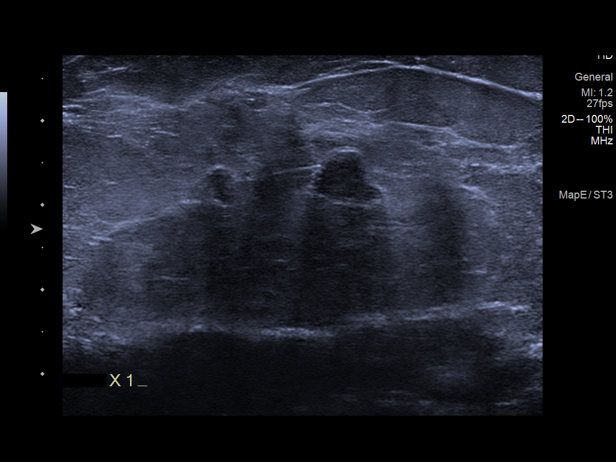
[im 4/19]
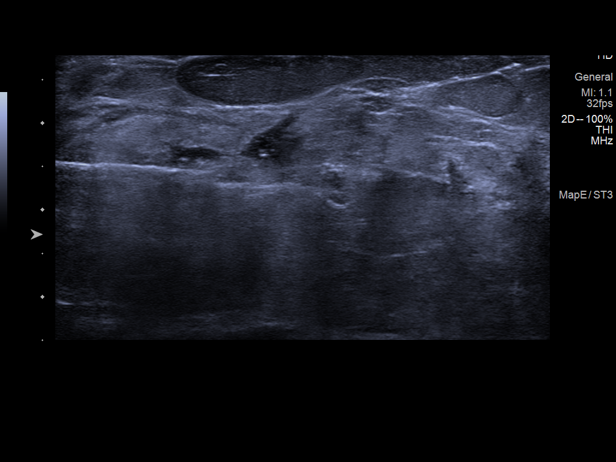
[im 5/19]
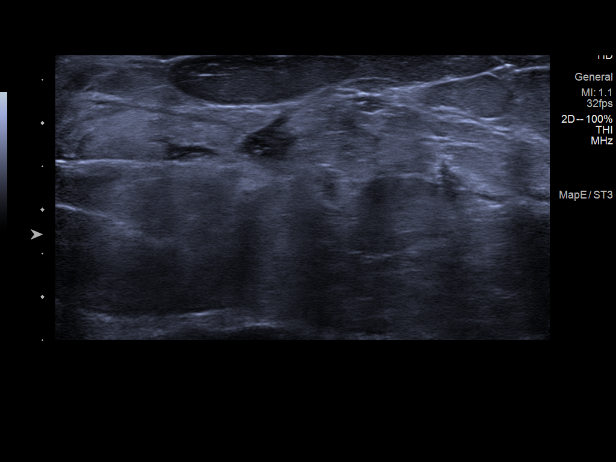
[im 8/19]
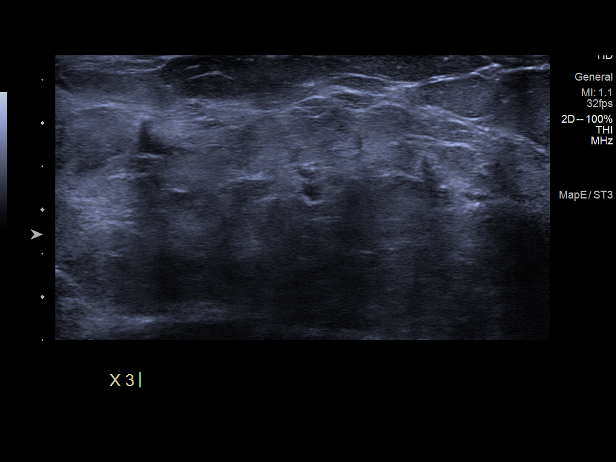
[im 11/19]
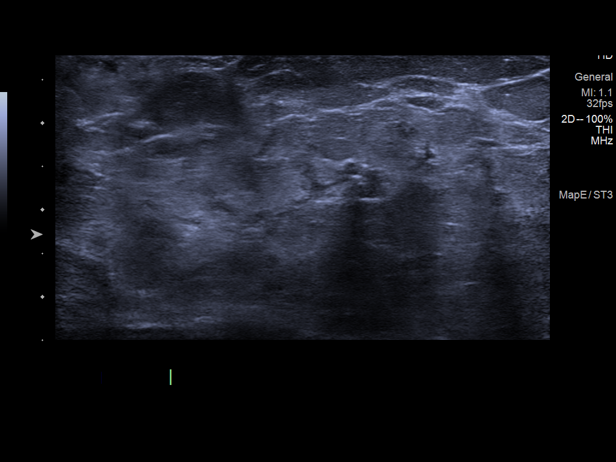
[im 13/19]
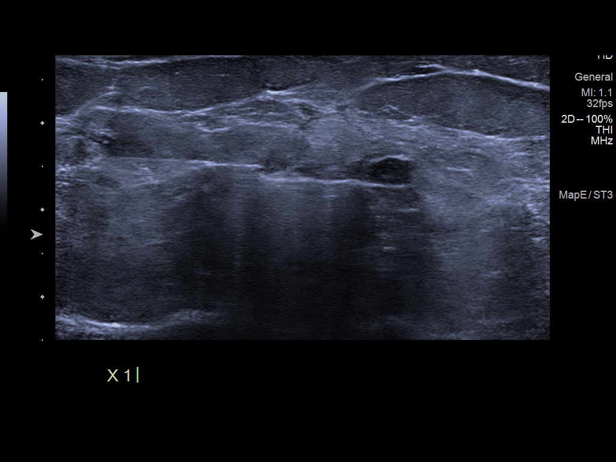
[im 16/19]
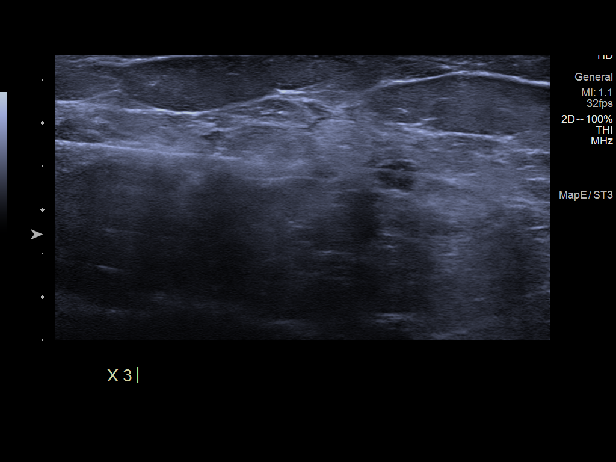
[im 17/19]
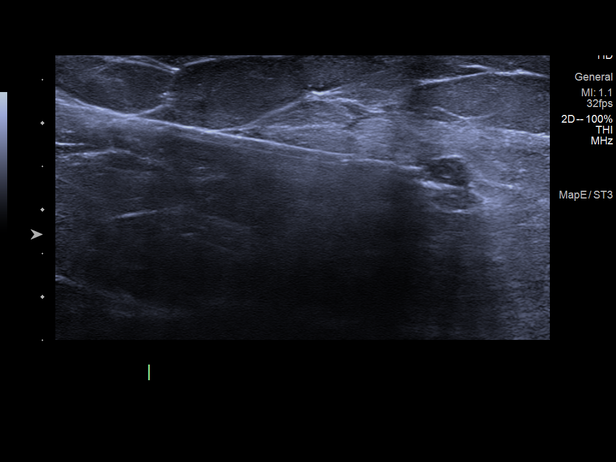

[Series 1: us breast bx w loc dev 1st lesion img bx spec us g · 0.07mm/px · 8 of 19 slices shown (2 of 2)]
[im 1/19]
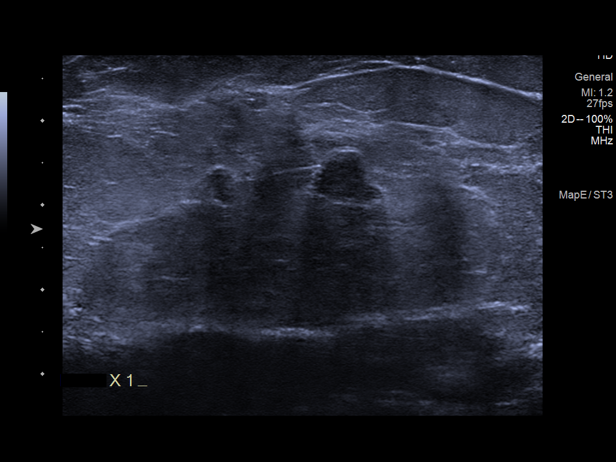
[im 2/19]
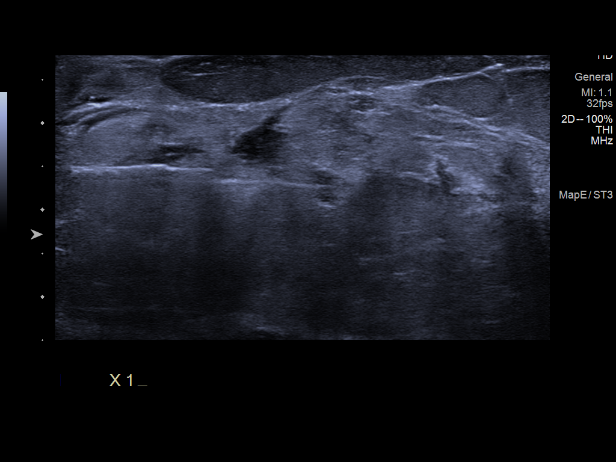
[im 5/19]
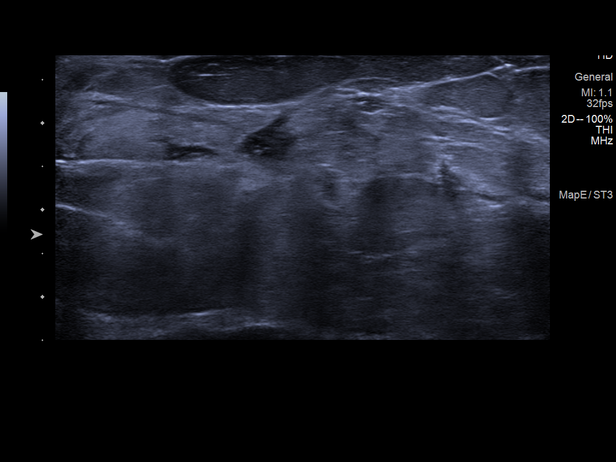
[im 7/19]
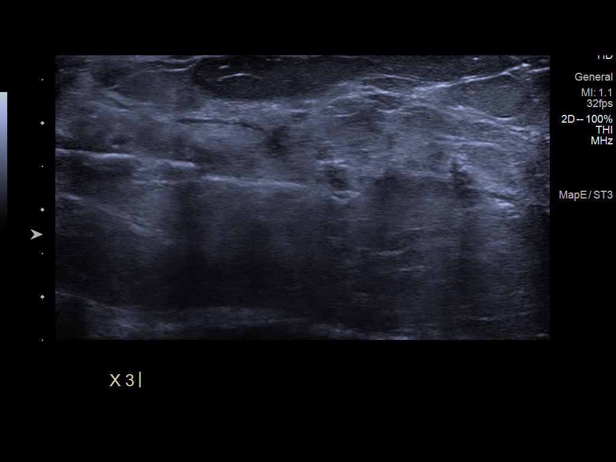
[im 10/19]
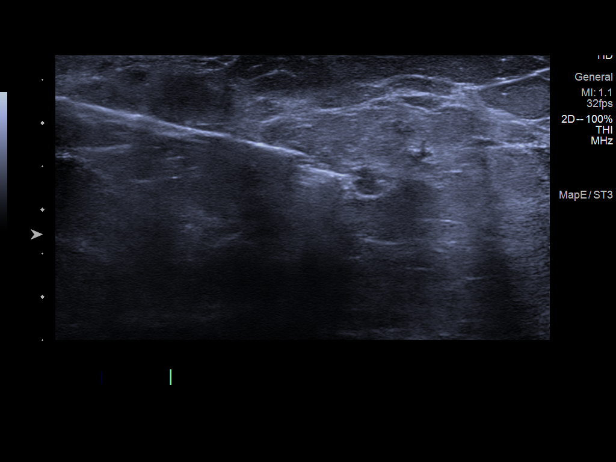
[im 14/19]
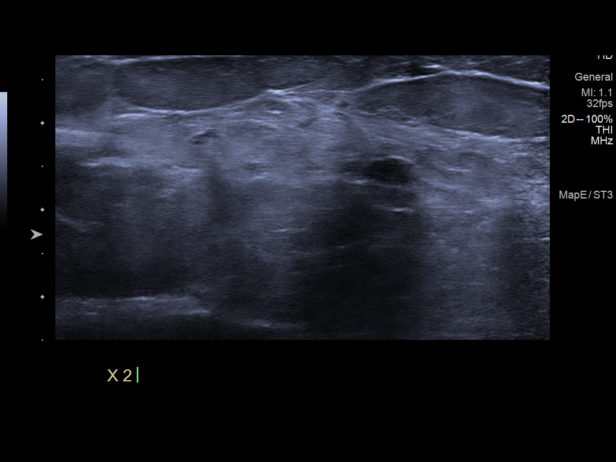
[im 15/19]
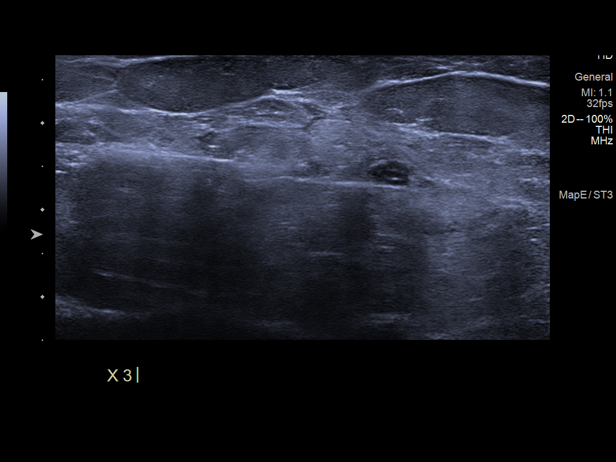
[im 19/19]
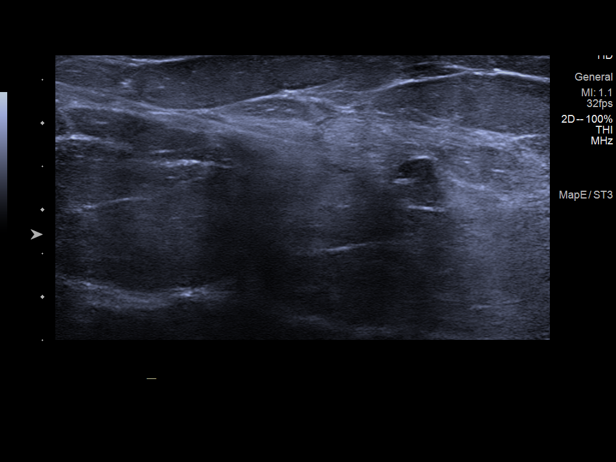

[16 of 25 positions shown; findings below may reference images not displayed]



SITE 1: LEFT BREAST 6 O'CLOCK SUBAREOLAR (SMALLER MASS): Lesion
quadrant: LOWER INNER

Using sterile technique and 1% Lidocaine as local anesthetic, under
direct ultrasound visualization, a 14 gauge Tripti device was
used to perform biopsy of the smaller mass in the left breast at 6
o'clock subareolar using a lateral to medial approach. At the
conclusion of the procedure a ribbon shaped tissue marker clip was
deployed into the biopsy cavity. Follow up 2 view mammogram was
performed and dictated separately.

SITE 2: LEFT BREAST 6 O'CLOCK SUBAREOLAR (LARGER MASS): Lesion
quadrant: LOWER INNER

Using sterile technique and 1% Lidocaine as local anesthetic, under
direct ultrasound visualization, a 14 gauge Tripti device was
used to perform biopsy of the larger mass in the left breast at 6
o'clock subareolar using a lateral to medial approach. At the
conclusion of the procedure a wing shaped tissue marker clip was
deployed into the biopsy cavity. Follow up 2 view mammogram was
performed and dictated separately.
IMPRESSION: 1. Ultrasound-guided biopsy of the smaller mass in the left breast
at 6 o'clock subareolar, at site of ribbon shaped biopsy marking
clip.

2. Ultrasound-guided biopsy of the larger mass in the left breast at
6 o'clock subareolar, at site of wing shaped biopsy marking clip.

ADDENDUM:
PATHOLOGY revealed: Site A. BREAST, [DATE], LEFT, BIOPSY: - Benign
breast parenchyma with no specific histopathologic changes. -
Negative for carcinoma.

Pathology results are CONCORDANT with imaging findings, per Dr.
Klever Esneider Rovayo.

PATHOLOGY revealed: Site B. BREAST, [DATE], LEFT, BIOPSY: -
Fibroadenoma with usual ductal hyperplasia - Negative for carcinoma

Pathology results are CONCORDANT with imaging findings, per Dr.
Klever Esneider Rovayo.

Pathology results and recommendations below were discussed with
patient by telephone on 02/03/2021. Patient reported biopsy site
within normal limits with slight tenderness at the site. Post biopsy
care instructions were reviewed, questions were answered and my
direct phone number was provided to patient. Patient was instructed
to call [HOSPITAL] [HOSPITAL] Mammography Department if
any concerns or questions arise related to the biopsy.

Recommendation: Resume annual bilateral screening mammogram due January 2022.

Pathology results reported by Blain Jumper RN on 02/03/2021.



SITE 1: LEFT BREAST 6 O'CLOCK SUBAREOLAR (SMALLER MASS): Lesion
quadrant: LOWER INNER

Using sterile technique and 1% Lidocaine as local anesthetic, under
direct ultrasound visualization, a 14 gauge Tripti device was
used to perform biopsy of the smaller mass in the left breast at 6
o'clock subareolar using a lateral to medial approach. At the
conclusion of the procedure a ribbon shaped tissue marker clip was
deployed into the biopsy cavity. Follow up 2 view mammogram was
performed and dictated separately.

SITE 2: LEFT BREAST 6 O'CLOCK SUBAREOLAR (LARGER MASS): Lesion
quadrant: LOWER INNER

Using sterile technique and 1% Lidocaine as local anesthetic, under
direct ultrasound visualization, a 14 gauge Tripti device was
used to perform biopsy of the larger mass in the left breast at 6
o'clock subareolar using a lateral to medial approach. At the
conclusion of the procedure a wing shaped tissue marker clip was
deployed into the biopsy cavity. Follow up 2 view mammogram was
performed and dictated separately.
IMPRESSION: 1. Ultrasound-guided biopsy of the smaller mass in the left breast
at 6 o'clock subareolar, at site of ribbon shaped biopsy marking
clip.

2. Ultrasound-guided biopsy of the larger mass in the left breast at
6 o'clock subareolar, at site of wing shaped biopsy marking clip.

## 2022-07-22 ENCOUNTER — Encounter: Payer: Self-pay | Admitting: Family Medicine

## 2022-09-30 ENCOUNTER — Other Ambulatory Visit: Payer: Self-pay | Admitting: Family Medicine

## 2022-09-30 DIAGNOSIS — Z1231 Encounter for screening mammogram for malignant neoplasm of breast: Secondary | ICD-10-CM

## 2022-11-28 ENCOUNTER — Ambulatory Visit: Payer: No Typology Code available for payment source

## 2023-01-09 ENCOUNTER — Ambulatory Visit (INDEPENDENT_AMBULATORY_CARE_PROVIDER_SITE_OTHER): Payer: No Typology Code available for payment source | Admitting: Family Medicine

## 2023-01-09 ENCOUNTER — Encounter: Payer: Self-pay | Admitting: Family Medicine

## 2023-01-09 VITALS — BP 135/83 | HR 89 | Temp 98.3°F | Ht 62.0 in | Wt 136.0 lb

## 2023-01-09 DIAGNOSIS — J029 Acute pharyngitis, unspecified: Secondary | ICD-10-CM

## 2023-01-09 DIAGNOSIS — R590 Localized enlarged lymph nodes: Secondary | ICD-10-CM | POA: Diagnosis not present

## 2023-01-09 MED ORDER — AMOXICILLIN-POT CLAVULANATE 875-125 MG PO TABS: 1.0000 | ORAL_TABLET | Freq: Two times a day (BID) | ORAL | 0 refills | Status: DC

## 2023-01-09 MED ORDER — FLUCONAZOLE 150 MG PO TABS: 150.0000 mg | ORAL_TABLET | ORAL | 0 refills | Status: DC

## 2023-01-09 NOTE — Progress Notes (Signed)
BP 135/83   Pulse 89   Temp 98.3 F (36.8 C)   Ht 5\' 2"  (1.575 m)   Wt 136 lb (61.7 kg)   SpO2 99%   BMI 24.87 kg/m    Subjective:   Patient ID: Helen Rice, female    DOB: 1975/11/12, 47 y.o.   MRN: [id number]  HPI: Helen Rice is a 47 y.o. female presenting on 01/09/2023 for Adenopathy (Right neck) and Sore Throat (Feels tight)   HPI Sore throat and lymphadenopathy and congestion Patient comes in today with complaints of sore throat and lymphadenopathy congestion has been going on for just over a week.  She says it started last week and she took a lot of over-the-counter stuff such as vitamin C and sinus cold medicine and other vitamin stuff and she says it was improving until last night it got worse again.  Last night she started feeling like her throat was more swollen and causing more issues and she has been coughing and congestion more and is having chills and sneezing and everything was worse last night.  She denies any wheezing.  She says she did feel little short of breath last night but nothing significant.  Relevant past medical, surgical, family and social history reviewed and updated as indicated. Interim medical history since our last visit reviewed. Allergies and medications reviewed and updated.  Review of Systems  Constitutional:  Positive for chills. Negative for fever.  HENT:  Positive for congestion, postnasal drip, rhinorrhea, sneezing and sore throat. Negative for ear discharge, ear pain and sinus pressure.   Eyes:  Negative for pain, redness and visual disturbance.  Respiratory:  Positive for cough and shortness of breath. Negative for chest tightness and wheezing.   Cardiovascular:  Negative for chest pain and leg swelling.  Genitourinary:  Negative for difficulty urinating and dysuria.  Musculoskeletal:  Negative for back pain and gait problem.  Skin:  Negative for rash.  Neurological:  Negative for light-headedness and headaches.   Psychiatric/Behavioral:  Negative for agitation and behavioral problems.   All other systems reviewed and are negative.   Per HPI unless specifically indicated above   Allergies as of 01/09/2023       Reactions   Influenza Vaccines    Cannot take   Latex    rash   Elemental Sulfur Rash        Medication List        Accurate as of January 09, 2023 12:38 PM. If you have any questions, ask your nurse or doctor.          amoxicillin-clavulanate 875-125 MG tablet Commonly known as: AUGMENTIN Take 1 tablet by mouth 2 (two) times daily. Started by: Elige Radon Justyne Roell, MD   azelastine 0.1 % nasal spray Commonly known as: ASTELIN Place 1 spray into both nostrils 2 (two) times daily.   conjugated estrogens 0.625 MG/GM vaginal cream Commonly known as: PREMARIN Place 1 Applicatorful vaginally daily.   diclofenac 75 MG EC tablet Commonly known as: VOLTAREN Take 1 tablet (75 mg total) by mouth 2 (two) times daily as needed for moderate pain (take with food and plenty of water).   escitalopram 10 MG tablet Commonly known as: Lexapro Take 1 tablet (10 mg total) by mouth daily.   estradiol 0.1 mg/24hr patch Commonly known as: Climara Place 1 patch (0.1 mg total) onto the skin once a week.   fluconazole 150 MG tablet Commonly known as: Diflucan Take 1 tablet (150 mg total) by  mouth once a week. Started by: Elige Radon Gideon Burstein, MD   fluorouracil 5 % cream Commonly known as: EFUDEX Apply topically 2 (two) times daily. To lesions on face X9 weeks   fluticasone 50 MCG/ACT nasal spray Commonly known as: FLONASE Place 2 sprays into both nostrils daily.   hydrocortisone 2.5 % rectal cream Commonly known as: Proctosol HC Place 1 application rectally 2 (two) times daily. X3-5 days as needed for hemorrhoids   hydrOXYzine 25 MG tablet Commonly known as: ATARAX Take 0.5-1 tablets (12.5-25 mg total) by mouth every 8 (eight) hours as needed for anxiety.   omeprazole 20 MG  capsule Commonly known as: PRILOSEC Take 20mg  PO up to BID prn stomach pain/ reflux   vitamin C 100 MG tablet Take 100 mg by mouth daily.   Vitamin D 125 MCG (5000 UT) Caps Take by mouth.         Objective:   BP 135/83   Pulse 89   Temp 98.3 F (36.8 C)   Ht 5\' 2"  (1.575 m)   Wt 136 lb (61.7 kg)   SpO2 99%   BMI 24.87 kg/m   Wt Readings from Last 3 Encounters:  01/09/23 136 lb (61.7 kg)  01/07/22 127 lb 3.2 oz (57.7 kg)  10/06/21 123 lb 3.2 oz (55.9 kg)    Physical Exam Vitals and nursing note reviewed.  Constitutional:      General: She is not in acute distress.    Appearance: She is well-developed. She is not diaphoretic.  HENT:     Right Ear: Tympanic membrane and ear canal normal.     Left Ear: Tympanic membrane and ear canal normal.     Mouth/Throat:     Mouth: Mucous membranes are moist.     Pharynx: Oropharynx is clear. No oropharyngeal exudate or posterior oropharyngeal erythema.  Eyes:     Conjunctiva/sclera: Conjunctivae normal.  Cardiovascular:     Rate and Rhythm: Normal rate and regular rhythm.     Heart sounds: Normal heart sounds. No murmur heard. Pulmonary:     Effort: Pulmonary effort is normal. No respiratory distress.     Breath sounds: Normal breath sounds. No wheezing, rhonchi or rales.  Musculoskeletal:        General: No tenderness. Normal range of motion.  Lymphadenopathy:     Cervical: Cervical adenopathy present.     Right cervical: Superficial cervical adenopathy and posterior cervical adenopathy present.     Left cervical: Superficial cervical adenopathy and posterior cervical adenopathy present.  Skin:    General: Skin is warm and dry.     Findings: No rash.  Neurological:     Mental Status: She is alert and oriented to person, place, and time.     Coordination: Coordination normal.  Psychiatric:        Behavior: Behavior normal.       Assessment & Plan:   Problem List Items Addressed This Visit   None Visit  Diagnoses     Pharyngitis, unspecified etiology    -  Primary   Relevant Medications   amoxicillin-clavulanate (AUGMENTIN) 875-125 MG tablet   fluconazole (DIFLUCAN) 150 MG tablet   Cervical lymphadenopathy         With her fighting this for over a week and it seemed to get worse and also having the lymphadenopathy we will go ahead and give her Augmentin.  Follow up plan: Return if symptoms worsen or fail to improve.  Counseling provided for all of the vaccine components  No orders of the defined types were placed in this encounter.   Arville Care, MD [facility] 01/09/2023, 12:38 PM

## 2023-01-22 ENCOUNTER — Other Ambulatory Visit: Payer: Self-pay | Admitting: Family Medicine

## 2023-01-22 DIAGNOSIS — Z90710 Acquired absence of both cervix and uterus: Secondary | ICD-10-CM

## 2023-01-23 MED ORDER — ESTRADIOL 0.1 MG/24HR TD PTWK: 0.1000 mg | MEDICATED_PATCH | TRANSDERMAL | 0 refills | Status: DC

## 2023-01-23 NOTE — Telephone Encounter (Signed)
[facility] [employer] Last OV 01/07/22 NO refills sent to pharmacy last OV over 1 year ago

## 2023-01-23 NOTE — Addendum Note (Signed)
Addended by: Julious Payer D on: 01/23/2023 04:18 PM   Modules accepted: Orders

## 2023-01-23 NOTE — Telephone Encounter (Signed)
Pt made first available appointment for 03/22/23. Please send in enough until then

## 2023-03-01 ENCOUNTER — Ambulatory Visit
Admission: RE | Admit: 2023-03-01 | Discharge: 2023-03-01 | Disposition: A | Discharge status: Home / Self Care | Payer: No Typology Code available for payment source | Source: Ambulatory Visit | Attending: Family Medicine | Admitting: Family Medicine

## 2023-03-01 DIAGNOSIS — Z1231 Encounter for screening mammogram for malignant neoplasm of breast: Secondary | ICD-10-CM

## 2023-03-22 ENCOUNTER — Ambulatory Visit (INDEPENDENT_AMBULATORY_CARE_PROVIDER_SITE_OTHER): Payer: No Typology Code available for payment source | Admitting: Family Medicine

## 2023-03-22 ENCOUNTER — Encounter: Payer: Self-pay | Admitting: Family Medicine

## 2023-03-22 VITALS — BP 122/74 | HR 83 | Temp 97.9°F | Ht 62.0 in | Wt 127.0 lb

## 2023-03-22 DIAGNOSIS — E559 Vitamin D deficiency, unspecified: Secondary | ICD-10-CM

## 2023-03-22 DIAGNOSIS — F4321 Adjustment disorder with depressed mood: Secondary | ICD-10-CM

## 2023-03-22 DIAGNOSIS — R635 Abnormal weight gain: Secondary | ICD-10-CM

## 2023-03-22 DIAGNOSIS — F418 Other specified anxiety disorders: Secondary | ICD-10-CM

## 2023-03-22 DIAGNOSIS — F331 Major depressive disorder, recurrent, moderate: Secondary | ICD-10-CM | POA: Diagnosis not present

## 2023-03-22 MED ORDER — DESVENLAFAXINE SUCCINATE ER 50 MG PO TB24: 50.0000 mg | ORAL_TABLET | Freq: Every day | ORAL | 0 refills | Status: DC

## 2023-03-22 MED ORDER — HYDROXYZINE HCL 25 MG PO TABS
12.5000 mg | ORAL_TABLET | Freq: Three times a day (TID) | ORAL | 1 refills | Status: AC | PRN
Start: 2023-03-22 — End: ?

## 2023-03-22 NOTE — Patient Instructions (Addendum)
You should receive a call this week to set up visits with our therapist here at the office.   If you decide to take time off work (which I think you'd benefit from), please let me know and I will be glad to complete FMLA/ short term disability if needed.  I want you to prioritize your mental health.  Taking the medicine as directed and not missing any doses is one of the best things you can do to treat your depression.  Here are some things to keep in mind:  Side effects (stomach upset, some increased anxiety) may happen before you notice a benefit.  These side effects typically go away over time. Changes to your dose of medicine or a change in medication all together is sometimes necessary Most people need to be on medication at least 12 months Many people will notice an improvement within two weeks but the full effect of the medication can take up to 4-6 weeks Stopping the medication when you start feeling better often results in a return of symptoms Never discontinue your medication without contacting a health care professional first.  Some medications require gradual discontinuation/ taper and can make you sick if you stop them abruptly.  If your symptoms worsen or you have thoughts of suicide/homicide, PLEASE SEEK IMMEDIATE MEDICAL ATTENTION.  You may always call:  National Suicide Hotline: 317-839-4338 [place] Crisis Line: [phone])316-4867 Crisis Recovery in [place]: 754-308-5505   These are available 24 hours a day, 7 days a week.

## 2023-03-22 NOTE — Progress Notes (Signed)
Subjective: CC: Depression PCP: Raliegh Ip, DO Helen Rice is a 47 y.o. female presenting to clinic today for:  1.  Depression Patient reports that she lost her uncle, who was like a father to her, and may.  She has been trying to deal with her grief but admits that things have really gotten bad over the last couple of months.  She reports isolating behaviors, zoning out, poor concentration.  She does not feel like she can talk to her husband about these things.  She does not have very many family members left to live that she feels like she can rely on.  She has not really talked to her mom over the last couple of months because there was a baby shower that was planned for her grandchild and her mother and siblings no showed her.  She has been upset about this since and really feels let down by her family and friend, who also no showed her.  She is not seeking any counseling.  She is off of Lexapro and did not really find that to be especially effective in the past.  She has Atarax on hand for anxiety attacks.  No suicidal intent but has had thoughts of what it would be like if something happened to hear (per nurse report).   ROS: Per HPI  Allergies  Allergen Reactions   Influenza Vaccines     Cannot take   Latex     rash   Elemental Sulfur Rash   Past Medical History:  Diagnosis Date   Fibromyalgia    GERD (gastroesophageal reflux disease) 05/30/2017   Lyme disease    being seen by [facility]   Psoriasis     Current Outpatient Medications:    Ascorbic Acid (VITAMIN C) 100 MG tablet, Take 100 mg by mouth daily., Disp: , Rfl:    azelastine (ASTELIN) 0.1 % nasal spray, Place 1 spray into both nostrils 2 (two) times daily., Disp: 30 mL, Rfl: 12   Cholecalciferol (VITAMIN D) 125 MCG (5000 UT) CAPS, Take by mouth., Disp: , Rfl:    conjugated estrogens (PREMARIN) vaginal cream, Place 1 Applicatorful vaginally daily., Disp: 42.5 g, Rfl: 12   diclofenac  (VOLTAREN) 75 MG EC tablet, Take 1 tablet (75 mg total) by mouth 2 (two) times daily as needed for moderate pain (take with food and plenty of water)., Disp: 60 tablet, Rfl: 1   escitalopram (LEXAPRO) 10 MG tablet, Take 1 tablet (10 mg total) by mouth daily., Disp: 30 tablet, Rfl: 1   estradiol (CLIMARA) 0.1 mg/24hr patch, Place 1 patch (0.1 mg total) onto the skin once a week., Disp: 12 patch, Rfl: 0   fluorouracil (EFUDEX) 5 % cream, Apply topically 2 (two) times daily. To lesions on face X9 weeks, Disp: 40 g, Rfl: 2   fluticasone (FLONASE) 50 MCG/ACT nasal spray, Place 2 sprays into both nostrils daily., Disp: , Rfl:    hydrocortisone (PROCTOSOL HC) 2.5 % rectal cream, Place 1 application rectally 2 (two) times daily. X3-5 days as needed for hemorrhoids, Disp: 30 g, Rfl: 0   hydrOXYzine (ATARAX) 25 MG tablet, Take 0.5-1 tablets (12.5-25 mg total) by mouth every 8 (eight) hours as needed for anxiety., Disp: 90 tablet, Rfl: 1   omeprazole (PRILOSEC) 20 MG capsule, Take 20mg  PO up to BID prn stomach pain/ reflux, Disp: 60 capsule, Rfl: 0 Social History   Socioeconomic History   Marital status: Married    Spouse name: Not on file  Number of children: 1   Years of education: GED   Highest education level: Not on file  Occupational History   Occupation: [employer]  Tobacco Use   Smoking status: Former    Current packs/day: 0.00    Average packs/day: 1.5 packs/day for 15.0 years (22.5 ttl pk-yrs)    Types: Cigarettes    Start date: 2001    Quit date: 2016    Years since quitting: 8.6   Smokeless tobacco: Never  Vaping Use   Vaping status: Every Day  Substance and Sexual Activity   Alcohol use: Yes    Comment: occ   Drug use: No   Sexual activity: Yes    Birth control/protection: Surgical  Other Topics Concern   Not on file  Social History Narrative   Right handed    Caffeine use: Drinks 1 cup coffee per day   1-3 sodas per day   Lives with husband   Social Determinants of Health    Financial Resource Strain: Not on file  Food Insecurity: Not on file  Transportation Needs: Not on file  Physical Activity: Not on file  Stress: Not on file  Social Connections: Not on file  Intimate Partner Violence: Not on file   Family History  Problem Relation Age of Onset   Cancer Father    Psoriasis Father    Autoimmune disease Father    Breast cancer Sister    Heart attack Brother 52       had a second MI which required quad bypass 40yr   Heart attack Brother 10       MI w/ stent   Thyroid disease Maternal Aunt    Heart disease Maternal Grandmother    Heart disease Maternal Grandfather    Ovarian cancer Paternal Grandmother 11   Ovarian cancer Paternal Grandfather     Objective: Office vital signs reviewed. BP 122/74   Pulse 83   Temp 97.9 F (36.6 C) (Temporal)   Ht 5\' 2"  (1.575 m)   Wt 127 lb (57.6 kg)   SpO2 98%   BMI 23.23 kg/m   Physical Examination:  General: Awake, alert, nontoxic female HEENT: Sclera injected.  Tearful.  No exophthalmos Cardio: regular rate and rhythm  Pulm: Normal work of breathing on room air Psych: Very tearful.  Eye contact poor.     03/22/2023    4:20 PM 01/09/2023   12:15 PM 01/07/2022    3:40 PM  Depression screen PHQ 2/9  Decreased Interest 1 0 0  Down, Depressed, Hopeless 1 0 0  PHQ - 2 Score 2 0 0  Altered sleeping 1 1   Tired, decreased energy 3 1   Change in appetite 2 0   Feeling bad or failure about yourself  0 0   Trouble concentrating 0 0   Moving slowly or fidgety/restless 0 0   Suicidal thoughts 0 0   PHQ-9 Score 8 2   Difficult doing work/chores Extremely dIfficult Not difficult at all       03/22/2023    4:21 PM 01/09/2023   12:15 PM 01/07/2022    3:40 PM 11/02/2021    8:28 AM  GAD 7 : Generalized Anxiety Score  Nervous, Anxious, on Edge 1 0 0   Control/stop worrying 1 0 0   Worry too much - different things 1 0 0   Trouble relaxing 1 0 0   Restless 1 0 0   Easily annoyed or irritable 1 1 0    Afraid -  awful might happen 1 1 0   Total GAD 7 Score 7 2 0   Anxiety Difficulty Extremely difficult Not difficult at all Not difficult at all      Information is confidential and restricted. Go to Review Flowsheets to unlock data.     Assessment/ Plan: 47 y.o. female   Moderate episode of recurrent major depressive disorder (HCC) - Plan: Ambulatory referral to [facility], desvenlafaxine (PRISTIQ) 50 MG 24 hr tablet, CMP14+EGFR, TSH, T4, Free, Bayer DCA Hb A1c Waived  Grief reaction - Plan: Ambulatory referral to [facility], desvenlafaxine (PRISTIQ) 50 MG 24 hr tablet, CMP14+EGFR, TSH, T4, Free  Situational anxiety - Plan: hydrOXYzine (ATARAX) 25 MG tablet, CMP14+EGFR, TSH, T4, Free  Vitamin D deficiency - Plan: CMP14+EGFR, VITAMIN D 25 Hydroxy (Vit-D Deficiency, Fractures)  Weight gain - Plan: Bayer DCA Hb A1c Waived  Suspect that underlying anxiety and depressive disorder is exacerbated by recent loss of her uncle.  Start Pristiq.  She has been off of Lexapro for a while.  I have renewed Atarax for as needed use for breakthrough anxiety and sleep needs.  I did encourage her to consider taking off a couple of weeks of work to just be able to get plugged into therapy, do some self-care.  I would like to see her closely back in the next 4 to 6 weeks, sooner if concerns arise.  The national suicide hotline, [facility] crisis hotline and [facility] crisis hotline numbers were provided today.  I have referred her to integrated with [facility] health for counseling services.  Will check metabolic labs given weight gain though I suspect that this is secondary to emotional eating.   Raliegh Ip, DO [facility] [phone]

## 2023-03-23 LAB — CMP14+EGFR
ALT: 19 IU/L (ref 0–32)
AST: 18 IU/L (ref 0–40)
Albumin: 4.8 g/dL (ref 3.9–4.9)
Alkaline Phosphatase: 132 IU/L — ABNORMAL HIGH (ref 44–121)
BUN/Creatinine Ratio: 13 (ref 9–23)
BUN: 10 mg/dL (ref 6–24)
Bilirubin Total: 0.3 mg/dL (ref 0.0–1.2)
CO2: 24 mmol/L (ref 20–29)
Calcium: 9.5 mg/dL (ref 8.7–10.2)
Chloride: 104 mmol/L (ref 96–106)
Creatinine, Ser: 0.75 mg/dL (ref 0.57–1.00)
Globulin, Total: 2.2 g/dL (ref 1.5–4.5)
Glucose: 84 mg/dL (ref 70–99)
Potassium: 4 mmol/L (ref 3.5–5.2)
Sodium: 143 mmol/L (ref 134–144)
Total Protein: 7 g/dL (ref 6.0–8.5)
eGFR: 98 mL/min/{1.73_m2} (ref >59)

## 2023-03-23 LAB — VITAMIN D 25 HYDROXY (VIT D DEFICIENCY, FRACTURES): Vit D, 25-Hydroxy: 27.8 ng/mL — ABNORMAL LOW (ref 30.0–100.0)

## 2023-03-23 LAB — BAYER DCA HB A1C WAIVED: HB A1C (BAYER DCA - WAIVED): 4.9 % (ref 4.8–5.6)

## 2023-03-23 LAB — T4, FREE: Free T4: 1.07 ng/dL (ref 0.82–1.77)

## 2023-03-23 LAB — TSH: TSH: 2.33 u[IU]/mL (ref 0.450–4.500)

## 2023-03-24 ENCOUNTER — Other Ambulatory Visit: Payer: Self-pay | Admitting: Family Medicine

## 2023-03-24 DIAGNOSIS — E559 Vitamin D deficiency, unspecified: Secondary | ICD-10-CM

## 2023-03-24 MED ORDER — VITAMIN D (ERGOCALCIFEROL) 1.25 MG (50000 UNIT) PO CAPS: 50000.0000 [IU] | ORAL_CAPSULE | ORAL | 0 refills | Status: DC

## 2023-04-19 ENCOUNTER — Ambulatory Visit: Payer: No Typology Code available for payment source | Admitting: Family Medicine

## 2023-05-30 ENCOUNTER — Other Ambulatory Visit: Payer: Self-pay | Admitting: Family Medicine

## 2023-05-30 DIAGNOSIS — F4321 Adjustment disorder with depressed mood: Secondary | ICD-10-CM

## 2023-05-30 DIAGNOSIS — F331 Major depressive disorder, recurrent, moderate: Secondary | ICD-10-CM

## 2023-07-19 ENCOUNTER — Encounter: Payer: Self-pay | Admitting: Family Medicine

## 2023-07-20 ENCOUNTER — Ambulatory Visit: Payer: No Typology Code available for payment source | Admitting: Family Medicine

## 2023-07-20 ENCOUNTER — Encounter: Payer: Self-pay | Admitting: Family Medicine

## 2023-07-20 VITALS — BP 146/88 | HR 60 | Temp 97.3°F | Ht 62.0 in

## 2023-07-20 DIAGNOSIS — N3001 Acute cystitis with hematuria: Secondary | ICD-10-CM | POA: Diagnosis not present

## 2023-07-20 DIAGNOSIS — R310 Gross hematuria: Secondary | ICD-10-CM

## 2023-07-20 DIAGNOSIS — Z8742 Personal history of other diseases of the female genital tract: Secondary | ICD-10-CM | POA: Diagnosis not present

## 2023-07-20 LAB — MICROSCOPIC EXAMINATION
RBC, Urine: >30 /[HPF] — AB (ref 0–2)
Renal Epithel, UA: NONE SEEN /[HPF]
WBC, UA: >30 /[HPF] — AB (ref 0–5)
Yeast, UA: NONE SEEN

## 2023-07-20 LAB — URINALYSIS, ROUTINE W REFLEX MICROSCOPIC
Bilirubin, UA: NEGATIVE
Glucose, UA: NEGATIVE
Ketones, UA: NEGATIVE
Nitrite, UA: POSITIVE — AB
Specific Gravity, UA: 1.02 (ref 1.005–1.030)
Urobilinogen, Ur: 0.2 mg/dL (ref 0.2–1.0)
pH, UA: 7 (ref 5.0–7.5)

## 2023-07-20 MED ORDER — CEPHALEXIN 500 MG PO CAPS: 500.0000 mg | ORAL_CAPSULE | Freq: Two times a day (BID) | ORAL | 0 refills | Status: DC

## 2023-07-20 MED ORDER — FLUCONAZOLE 150 MG PO TABS: 150.0000 mg | ORAL_TABLET | Freq: Once | ORAL | 0 refills | Status: AC

## 2023-07-20 NOTE — Progress Notes (Signed)
Subjective:  Patient ID: Helen Rice, female    DOB: 10-May-1976, 47 y.o.   MRN: [id number]  Patient Care Team: Raliegh Ip, DO as PCP - General (Family Medicine)   Chief Complaint:  Hematuria (Patient states it has been going on a few days. ) and Back Pain (Right sided lower back pain that started last night. )   HPI: Helen Rice is a 47 y.o. female presenting on 07/20/2023 for Hematuria (Patient states it has been going on a few days. ) and Back Pain (Right sided lower back pain that started last night. )   Discussed the use of AI scribe software for clinical note transcription with the patient, who gave verbal consent to proceed.  History of Present Illness   The patient presented with a chief complaint of hematuria, characterized by dark, fresh blood in the urine. The symptom was first noticed a few weeks prior, when the urine appeared pink, but it resolved quickly and was not considered concerning. However, in the last couple of days, the hematuria has become more severe and noticeable.  The patient denied any urinary frequency or dysuria, which are symptoms she has experienced with previous urinary tract infections (UTIs). She reported a new onset of right-sided flank pain the night before the consultation. There was no history of kidney stones, weight changes, fevers, chills, confusion, or weakness. The patient's last UTI treatment was a long time ago, and there was no recent change in bowel habits, bathing habits, caffeine use, or supplement intake.  The patient also reported chronic fatigue, which she experiences daily and is not a new symptom. She is currently taking vitamin D and vitamin C supplements. There was no report of dyspareunia or vaginal discharge. The patient also mentioned a history of yeast infections following antibiotic use.          Relevant past medical, surgical, family, and social history reviewed and updated as indicated.  Allergies and  medications reviewed and updated. Data reviewed: Chart in Epic.   Past Medical History:  Diagnosis Date   Fibromyalgia    GERD (gastroesophageal reflux disease) 05/30/2017   Lyme disease    being seen by [facility]   Psoriasis     Past Surgical History:  Procedure Laterality Date   ABDOMINAL HYSTERECTOMY     APPENDECTOMY     CESAREAN SECTION     CHOLECYSTECTOMY     COLONOSCOPY     upper/lower   GIVENS CAPSULE STUDY N/A 06/21/2017   Procedure: GIVENS CAPSULE STUDY;  Surgeon: Malissa Hippo, MD;  Location: [facility];  Service: Endoscopy;  Laterality: N/A;    Social History   Socioeconomic History   Marital status: Married    Spouse name: Not on file   Number of children: 1   Years of education: GED   Highest education level: Not on file  Occupational History   Occupation: [employer]  Tobacco Use   Smoking status: Former    Current packs/day: 0.00    Average packs/day: 1.5 packs/day for 15.0 years (22.5 ttl pk-yrs)    Types: Cigarettes    Start date: 2001    Quit date: 2016    Years since quitting: 8.9   Smokeless tobacco: Never  Vaping Use   Vaping status: Every Day  Substance and Sexual Activity   Alcohol use: Yes    Comment: occ   Drug use: No   Sexual activity: Yes    Birth control/protection: Surgical  Other Topics Concern   Not on file  Social History Narrative   Right handed    Caffeine use: Drinks 1 cup coffee per day   1-3 sodas per day   Lives with husband   Social Drivers of Corporate investment banker Strain: Not on file  Food Insecurity: Not on file  Transportation Needs: Not on file  Physical Activity: Not on file  Stress: Not on file  Social Connections: Not on file  Intimate Partner Violence: Not on file    Outpatient Encounter Medications as of 07/20/2023  Medication Sig   Ascorbic Acid (VITAMIN C) 100 MG tablet Take 100 mg by mouth daily.   azelastine (ASTELIN) 0.1 % nasal spray Place 1 spray into both nostrils 2  (two) times daily.   cephALEXin (KEFLEX) 500 MG capsule Take 1 capsule (500 mg total) by mouth 2 (two) times daily.   Cholecalciferol (VITAMIN D) 125 MCG (5000 UT) CAPS Take by mouth.   conjugated estrogens (PREMARIN) vaginal cream Place 1 Applicatorful vaginally daily.   desvenlafaxine (PRISTIQ) 50 MG 24 hr tablet Take 1 tablet by mouth once daily   estradiol (CLIMARA) 0.1 mg/24hr patch Place 1 patch (0.1 mg total) onto the skin once a week.   fluconazole (DIFLUCAN) 150 MG tablet Take 1 tablet (150 mg total) by mouth once for 1 dose.   hydrocortisone (PROCTOSOL HC) 2.5 % rectal cream Place 1 application rectally 2 (two) times daily. X3-5 days as needed for hemorrhoids   hydrOXYzine (ATARAX) 25 MG tablet Take 0.5-1 tablets (12.5-25 mg total) by mouth every 8 (eight) hours as needed for anxiety.   Vitamin D, Ergocalciferol, (DRISDOL) 1.25 MG (50000 UNIT) CAPS capsule Take 1 capsule (50,000 Units total) by mouth every 7 (seven) days. X12 weeks, then start OTC Vit D 400IU daily to maintain levels   diclofenac (VOLTAREN) 75 MG EC tablet Take 1 tablet (75 mg total) by mouth 2 (two) times daily as needed for moderate pain (take with food and plenty of water). (Patient not taking: Reported on 07/20/2023)   fluorouracil (EFUDEX) 5 % cream Apply topically 2 (two) times daily. To lesions on face X9 weeks (Patient not taking: Reported on 07/20/2023)   fluticasone (FLONASE) 50 MCG/ACT nasal spray Place 2 sprays into both nostrils daily. (Patient not taking: Reported on 07/20/2023)   omeprazole (PRILOSEC) 20 MG capsule Take 20mg  PO up to BID prn stomach pain/ reflux (Patient not taking: Reported on 07/20/2023)   No facility-administered encounter medications on file as of 07/20/2023.    Allergies  Allergen Reactions   Influenza Vaccines     Cannot take   Latex     rash   Elemental Sulfur Rash    Pertinent ROS per HPI, otherwise unremarkable      Objective:  BP (!) 146/88   Pulse 60   Temp (!)  97.3 F (36.3 C)   Ht 5\' 2"  (1.575 m)   SpO2 99%   BMI 23.23 kg/m    Wt Readings from Last 3 Encounters:  03/22/23 127 lb (57.6 kg)  01/09/23 136 lb (61.7 kg)  01/07/22 127 lb 3.2 oz (57.7 kg)    Physical Exam Vitals and nursing note reviewed.  Constitutional:      General: She is not in acute distress.    Appearance: Normal appearance. She is well-developed and well-groomed. She is not ill-appearing, toxic-appearing or diaphoretic.  HENT:     Head: Normocephalic and atraumatic.     Jaw: There is normal jaw  occlusion.     Right Ear: Hearing normal.     Left Ear: Hearing normal.     Nose: Nose normal.     Mouth/Throat:     Lips: Pink.     Mouth: Mucous membranes are moist.     Pharynx: Oropharynx is clear. Uvula midline.  Eyes:     General: Lids are normal.     Extraocular Movements: Extraocular movements intact.     Conjunctiva/sclera: Conjunctivae normal.     Pupils: Pupils are equal, round, and reactive to light.  Neck:     Thyroid: No thyroid mass, thyromegaly or thyroid tenderness.     Vascular: No carotid bruit or JVD.     Trachea: Trachea and phonation normal.  Cardiovascular:     Rate and Rhythm: Normal rate and regular rhythm.     Chest Wall: PMI is not displaced.     Pulses: Normal pulses.     Heart sounds: Normal heart sounds. No murmur heard.    No friction rub. No gallop.  Pulmonary:     Effort: Pulmonary effort is normal. No respiratory distress.     Breath sounds: Normal breath sounds. No wheezing.  Abdominal:     General: Bowel sounds are normal. There is no distension or abdominal bruit.     Palpations: Abdomen is soft. There is no hepatomegaly or splenomegaly.     Tenderness: There is no abdominal tenderness. There is no right CVA tenderness or left CVA tenderness.     Hernia: No hernia is present.  Musculoskeletal:        General: Normal range of motion.     Cervical back: Normal range of motion and neck supple.     Right lower leg: No edema.      Left lower leg: No edema.  Lymphadenopathy:     Cervical: No cervical adenopathy.  Skin:    General: Skin is warm and dry.     Capillary Refill: Capillary refill takes less than 2 seconds.     Coloration: Skin is not cyanotic, jaundiced or pale.     Findings: No rash.  Neurological:     General: No focal deficit present.     Mental Status: She is alert and oriented to person, place, and time.     Sensory: Sensation is intact.     Motor: Motor function is intact.     Coordination: Coordination is intact.     Gait: Gait is intact.     Deep Tendon Reflexes: Reflexes are normal and symmetric.  Psychiatric:        Attention and Perception: Attention and perception normal.        Mood and Affect: Mood and affect normal.        Speech: Speech normal.        Behavior: Behavior normal. Behavior is cooperative.        Thought Content: Thought content normal.        Cognition and Memory: Cognition and memory normal.        Judgment: Judgment normal.      Results for orders placed or performed in visit on 03/22/23  Bayer DCA Hb A1c Waived   Collection Time: 03/22/23  4:20 PM  Result Value Ref Range   HB A1C (BAYER DCA - WAIVED) 4.9 4.8 - 5.6 %  CMP14+EGFR   Collection Time: 03/22/23  4:31 PM  Result Value Ref Range   Glucose 84 70 - 99 mg/dL   BUN 10 6 - 24  mg/dL   Creatinine, Ser 8.29 0.57 - 1.00 mg/dL   eGFR 98 >56 OZ/HYQ/6.57   BUN/Creatinine Ratio 13 9 - 23   Sodium 143 134 - 144 mmol/L   Potassium 4.0 3.5 - 5.2 mmol/L   Chloride 104 96 - 106 mmol/L   CO2 24 20 - 29 mmol/L   Calcium 9.5 8.7 - 10.2 mg/dL   Total Protein 7.0 6.0 - 8.5 g/dL   Albumin 4.8 3.9 - 4.9 g/dL   Globulin, Total 2.2 1.5 - 4.5 g/dL   Bilirubin Total 0.3 0.0 - 1.2 mg/dL   Alkaline Phosphatase 132 (H) 44 - 121 IU/L   AST 18 0 - 40 IU/L   ALT 19 0 - 32 IU/L  TSH   Collection Time: 03/22/23  4:31 PM  Result Value Ref Range   TSH 2.330 0.450 - 4.500 uIU/mL  T4, Free   Collection Time: 03/22/23  4:31  PM  Result Value Ref Range   Free T4 1.07 0.82 - 1.77 ng/dL  VITAMIN D 25 Hydroxy (Vit-D Deficiency, Fractures)   Collection Time: 03/22/23  4:31 PM  Result Value Ref Range   Vit D, 25-Hydroxy 27.8 (L) 30.0 - 100.0 ng/mL       Pertinent labs & imaging results that were available during my care of the patient were reviewed by me and considered in my medical decision making.  Assessment & Plan:  Ayna was seen today for hematuria and back pain.  Diagnoses and all orders for this visit:  Gross hematuria -     Urinalysis, Routine w reflex microscopic -     Urine Culture  Acute cystitis with hematuria -     cephALEXin (KEFLEX) 500 MG capsule; Take 1 capsule (500 mg total) by mouth 2 (two) times daily.  History of vaginitis -     fluconazole (DIFLUCAN) 150 MG tablet; Take 1 tablet (150 mg total) by mouth once for 1 dose.     Assessment and Plan    Hematuria secondary to Urinary Tract Infection (UTI) Hematuria with dark, fresh blood noted over the past few days. Urine analysis positive for nitrites, leukocytes, and blood, indicating a UTI. No associated urinary frequency, dysuria, or history of nephrolithiasis. Mild right-sided pain noted. No recent history of UTIs or systemic symptoms such as fever, chills, or weight changes. Differential diagnosis includes UTI, but further evaluation may be needed if symptoms persist. Discussed risks of untreated UTI, including potential for pyelonephritis. Benefits of antibiotic treatment include resolution of infection and hematuria. Advised to increase water intake and avoid caffeine to reduce irritation and spasm. Informed about potential for candidiasis with antibiotic use and prescribed antifungal medication as a preventive measure. - Prescribe Keflex for UTI - Order urine culture - Advise increased water intake - Advise avoidance of caffeine - Instruct to report persistent hematuria after two days of antibiotics - Schedule follow-up in two  weeks for repeat urine culture - Send prescription for antifungal medication to prevent candidiasis - Send all prescriptions to [facility] pharmacy  General Health Maintenance Routine health maintenance discussed in the context of UTI treatment and follow-up. - Advise follow-up with Dr. Nadine Counts on December 27 for repeat urine culture.          Continue all other maintenance medications.  Follow up plan: Return if symptoms worsen or fail to improve.   Continue healthy lifestyle choices, including diet (rich in fruits, vegetables, and lean proteins, and low in salt and simple carbohydrates) and exercise (at least 30 minutes  of moderate physical activity daily).    The above assessment and management plan was discussed with the patient. The patient verbalized understanding of and has agreed to the management plan. Patient is aware to call the clinic if they develop any new symptoms or if symptoms persist or worsen. Patient is aware when to return to the clinic for a follow-up visit. Patient educated on when it is appropriate to go to the emergency department.   Kari Baars, FNP-C [facility] [phone]

## 2023-07-22 LAB — URINE CULTURE

## 2023-08-04 ENCOUNTER — Ambulatory Visit (INDEPENDENT_AMBULATORY_CARE_PROVIDER_SITE_OTHER): Payer: No Typology Code available for payment source | Admitting: Family Medicine

## 2023-08-04 ENCOUNTER — Encounter: Payer: Self-pay | Admitting: Family Medicine

## 2023-08-04 VITALS — BP 139/82 | HR 78 | Temp 98.7°F | Ht 62.0 in | Wt 130.8 lb

## 2023-08-04 DIAGNOSIS — F331 Major depressive disorder, recurrent, moderate: Secondary | ICD-10-CM | POA: Diagnosis not present

## 2023-08-04 DIAGNOSIS — M1990 Unspecified osteoarthritis, unspecified site: Secondary | ICD-10-CM

## 2023-08-04 DIAGNOSIS — R3121 Asymptomatic microscopic hematuria: Secondary | ICD-10-CM | POA: Diagnosis not present

## 2023-08-04 DIAGNOSIS — F4321 Adjustment disorder with depressed mood: Secondary | ICD-10-CM | POA: Diagnosis not present

## 2023-08-04 DIAGNOSIS — K118 Other diseases of salivary glands: Secondary | ICD-10-CM | POA: Diagnosis not present

## 2023-08-04 DIAGNOSIS — E782 Mixed hyperlipidemia: Secondary | ICD-10-CM

## 2023-08-04 LAB — URINALYSIS, ROUTINE W REFLEX MICROSCOPIC
Bilirubin, UA: NEGATIVE
Glucose, UA: NEGATIVE
Ketones, UA: NEGATIVE
Nitrite, UA: NEGATIVE
Protein,UA: NEGATIVE
Specific Gravity, UA: 1.02 (ref 1.005–1.030)
Urobilinogen, Ur: 0.2 mg/dL (ref 0.2–1.0)
pH, UA: 5.5 (ref 5.0–7.5)

## 2023-08-04 LAB — LIPID PANEL

## 2023-08-04 LAB — MICROSCOPIC EXAMINATION
RBC, Urine: >30 /[HPF] — AB (ref 0–2)
Renal Epithel, UA: NONE SEEN /[HPF]
WBC, UA: >30 /[HPF] — AB (ref 0–5)
Yeast, UA: NONE SEEN

## 2023-08-04 MED ORDER — DESVENLAFAXINE SUCCINATE ER 100 MG PO TB24: 100.0000 mg | ORAL_TABLET | Freq: Every day | ORAL | 3 refills | Status: DC

## 2023-08-04 NOTE — Progress Notes (Signed)
Subjective: CC: Multiple concerns PCP: Raliegh Ip, DO ZOX:WRUE A Hardwicke is a 47 y.o. female presenting to clinic today for:  Patiently initially here for follow-up on gross hematuria that was appreciated on 07/20/2023.  She notes that preceding the episode she had had some intermittent side pain but no gross dysuria, frequency etc.  She was diagnosed as a urinary tract infection and treated with oral cephalosporin.  Her urine culture did grow Proteus.  She denies any symptomology and has not seen any hematuria since.  No flank pain, fevers  Facial swelling noted along the right cheek over the last several weeks.  She talked to her dentist about this and they recommended that she follow-up with PCP.  She denies any pain.  No change with eating.  No swelling with eating.  No fevers.  Depression and anxiety Patient reports that symptoms used to be really well-controlled with Pristiq 50 mg but she does not feel like symptoms are as well-controlled as they had been.  Would like to go up on dose   ROS: Per HPI  Allergies  Allergen Reactions   Influenza Vaccines     Cannot take   Latex     rash   Elemental Sulfur Rash   Past Medical History:  Diagnosis Date   Fibromyalgia    GERD (gastroesophageal reflux disease) 05/30/2017   Lyme disease    being seen by [facility]   Psoriasis     Current Outpatient Medications:    Ascorbic Acid (VITAMIN C) 100 MG tablet, Take 100 mg by mouth daily., Disp: , Rfl:    azelastine (ASTELIN) 0.1 % nasal spray, Place 1 spray into both nostrils 2 (two) times daily., Disp: 30 mL, Rfl: 12   cephALEXin (KEFLEX) 500 MG capsule, Take 1 capsule (500 mg total) by mouth 2 (two) times daily., Disp: 14 capsule, Rfl: 0   Cholecalciferol (VITAMIN D) 125 MCG (5000 UT) CAPS, Take by mouth., Disp: , Rfl:    conjugated estrogens (PREMARIN) vaginal cream, Place 1 Applicatorful vaginally daily., Disp: 42.5 g, Rfl: 12   desvenlafaxine (PRISTIQ) 50  MG 24 hr tablet, Take 1 tablet by mouth once daily, Disp: 90 tablet, Rfl: 0   diclofenac (VOLTAREN) 75 MG EC tablet, Take 1 tablet (75 mg total) by mouth 2 (two) times daily as needed for moderate pain (take with food and plenty of water). (Patient not taking: Reported on 07/20/2023), Disp: 60 tablet, Rfl: 1   estradiol (CLIMARA) 0.1 mg/24hr patch, Place 1 patch (0.1 mg total) onto the skin once a week., Disp: 12 patch, Rfl: 0   fluorouracil (EFUDEX) 5 % cream, Apply topically 2 (two) times daily. To lesions on face X9 weeks (Patient not taking: Reported on 07/20/2023), Disp: 40 g, Rfl: 2   fluticasone (FLONASE) 50 MCG/ACT nasal spray, Place 2 sprays into both nostrils daily. (Patient not taking: Reported on 07/20/2023), Disp: , Rfl:    hydrocortisone (PROCTOSOL HC) 2.5 % rectal cream, Place 1 application rectally 2 (two) times daily. X3-5 days as needed for hemorrhoids, Disp: 30 g, Rfl: 0   hydrOXYzine (ATARAX) 25 MG tablet, Take 0.5-1 tablets (12.5-25 mg total) by mouth every 8 (eight) hours as needed for anxiety., Disp: 90 tablet, Rfl: 1   omeprazole (PRILOSEC) 20 MG capsule, Take 20mg  PO up to BID prn stomach pain/ reflux (Patient not taking: Reported on 07/20/2023), Disp: 60 capsule, Rfl: 0   Vitamin D, Ergocalciferol, (DRISDOL) 1.25 MG (50000 UNIT) CAPS capsule, Take 1 capsule (  50,000 Units total) by mouth every 7 (seven) days. X12 weeks, then start OTC Vit D 400IU daily to maintain levels, Disp: 12 capsule, Rfl: 0 Social History   Socioeconomic History   Marital status: Married    Spouse name: Not on file   Number of children: 1   Years of education: GED   Highest education level: Not on file  Occupational History   Occupation: [employer]  Tobacco Use   Smoking status: Former    Current packs/day: 0.00    Average packs/day: 1.5 packs/day for 15.0 years (22.5 ttl pk-yrs)    Types: Cigarettes    Start date: 2001    Quit date: 2016    Years since quitting: 8.9   Smokeless tobacco: Never   Vaping Use   Vaping status: Every Day  Substance and Sexual Activity   Alcohol use: Yes    Comment: occ   Drug use: No   Sexual activity: Yes    Birth control/protection: Surgical  Other Topics Concern   Not on file  Social History Narrative   Right handed    Caffeine use: Drinks 1 cup coffee per day   1-3 sodas per day   Lives with husband   Social Drivers of Corporate investment banker Strain: Not on file  Food Insecurity: Not on file  Transportation Needs: Not on file  Physical Activity: Not on file  Stress: Not on file  Social Connections: Not on file  Intimate Partner Violence: Not on file   Family History  Problem Relation Age of Onset   Cancer Father    Psoriasis Father    Autoimmune disease Father    Breast cancer Sister    Heart attack Brother 33       had a second MI which required quad bypass 17yr   Heart attack Brother 47       MI w/ stent   Thyroid disease Maternal Aunt    Heart disease Maternal Grandmother    Heart disease Maternal Grandfather    Ovarian cancer Paternal Grandmother 73   Ovarian cancer Paternal Grandfather     Objective: Office vital signs reviewed. BP 139/82   Pulse 78   Temp 98.7 F (37.1 C)   Ht 5\' 2"  (1.575 m)   Wt 130 lb 12.8 oz (59.3 kg)   SpO2 98%   BMI 23.92 kg/m   Physical Examination:  General: Awake, alert, well nourished, No acute distress HEENT: No gross facial swelling.  No lymphadenopathy appreciated in the cervical spine.  She does have a small, pea sized soft tissue mass appreciated at the apex of the parotid gland on the right.  This is nontender Cardio: regular rate and rhythm, S1S2 heard, no murmurs appreciated Pulm: clear to auscultation bilaterally, no wheezes, rhonchi or rales; normal work of breathing on room air MSK: Mild osteoarthritic changes noted to the distal joint of the right thumb.  No swelling or erythema or warmth.     08/04/2023    8:06 AM 03/22/2023    4:20 PM 01/09/2023   12:15 PM   Depression screen PHQ 2/9  Decreased Interest 1 1 0  Down, Depressed, Hopeless 1 1 0  PHQ - 2 Score 2 2 0  Altered sleeping 3 1 1   Tired, decreased energy 0 3 1  Change in appetite 0 2 0  Feeling bad or failure about yourself  0 0 0  Trouble concentrating 0 0 0  Moving slowly or fidgety/restless 0 0 0  Suicidal thoughts 0 0 0  PHQ-9 Score 5 8 2   Difficult doing work/chores Not difficult at all Extremely dIfficult Not difficult at all      08/04/2023    8:06 AM 03/22/2023    4:21 PM 01/09/2023   12:15 PM 01/07/2022    3:40 PM  GAD 7 : Generalized Anxiety Score  Nervous, Anxious, on Edge 0 1 0 0  Control/stop worrying 0 1 0 0  Worry too much - different things 0 1 0 0  Trouble relaxing 3 1 0 0  Restless 1 1 0 0  Easily annoyed or irritable 2 1 1  0  Afraid - awful might happen 0 1 1 0  Total GAD 7 Score 6 7 2  0  Anxiety Difficulty Not difficult at all Extremely difficult Not difficult at all Not difficult at all    Assessment/ Plan: 47 y.o. female   Mass of salivary gland - Plan: CBC with Differential, CT Maxillofacial W/Cm  Asymptomatic microscopic hematuria - Plan: Urine Culture, Urinalysis, Routine w reflex microscopic, Ambulatory referral to Urology  Moderate episode of recurrent major depressive disorder (HCC) - Plan: desvenlafaxine (PRISTIQ) 100 MG 24 hr tablet  Grief reaction - Plan: desvenlafaxine (PRISTIQ) 100 MG 24 hr tablet  Arthritis - Plan: CBC with Differential, C-reactive protein, Sedimentation Rate, Uric Acid  Mixed hyperlipidemia - Plan: Lipid Panel  Uncertain etiology.  At first I thought perhaps it was a salivary gland stone but given absence of pain and/or swelling during eating must rule out malignancy.  I did not appreciate any lymphadenopathy within the C-spine.  Will check CBC  Urinalysis with persistent hematuria and elevation white blood cells.  Urine culture sent.  I am going to go ahead and refer her to urology for further evaluation  I have  advanced her Pristiq to 100 mg daily.  Plan to reevaluate in March, sooner if concerns arise.  Suspect osteoarthritis of that right thumb.  Encouraged her to use oral NSAID if needed.  Check CBC, CRP, sed rate, uric acid level.  May need consider referral to orthopedics for corticosteroid injection  Repeat fasting lipid collected today.  That we did not discuss lipid at detail   Raliegh Ip, DO [facility] [phone]

## 2023-08-05 LAB — URIC ACID: Uric Acid: 5.3 mg/dL (ref 2.6–6.2)

## 2023-08-05 LAB — CBC WITH DIFFERENTIAL/PLATELET
Basophils Absolute: 0 10*3/uL (ref 0.0–0.2)
Basos: 1 %
EOS (ABSOLUTE): 0.1 10*3/uL (ref 0.0–0.4)
Eos: 2 %
Hematocrit: 39.8 % (ref 34.0–46.6)
Hemoglobin: 13.2 g/dL (ref 11.1–15.9)
Immature Grans (Abs): 0 10*3/uL (ref 0.0–0.1)
Immature Granulocytes: 0 %
Lymphocytes Absolute: 1.2 10*3/uL (ref 0.7–3.1)
Lymphs: 32 %
MCH: 30.1 pg (ref 26.6–33.0)
MCHC: 33.2 g/dL (ref 31.5–35.7)
MCV: 91 fL (ref 79–97)
Monocytes Absolute: 0.2 10*3/uL (ref 0.1–0.9)
Monocytes: 6 %
Neutrophils Absolute: 2.2 10*3/uL (ref 1.4–7.0)
Neutrophils: 59 %
Platelets: 276 10*3/uL (ref 150–450)
RBC: 4.39 x10E6/uL (ref 3.77–5.28)
RDW: 12.1 % (ref 11.7–15.4)
WBC: 3.7 10*3/uL (ref 3.4–10.8)

## 2023-08-05 LAB — LIPID PANEL
Cholesterol, Total: 211 mg/dL — ABNORMAL HIGH (ref 100–199)
HDL: 53 mg/dL (ref >39)
LDL CALC COMMENT:: 4 ratio (ref 0.0–4.4)
LDL Chol Calc (NIH): 135 mg/dL — ABNORMAL HIGH (ref 0–99)
Triglycerides: 128 mg/dL (ref 0–149)
VLDL Cholesterol Cal: 23 mg/dL (ref 5–40)

## 2023-08-05 LAB — SEDIMENTATION RATE: Sed Rate: 12 mm/h (ref 0–32)

## 2023-08-05 LAB — C-REACTIVE PROTEIN: CRP: 4 mg/L (ref 0–10)

## 2023-08-06 LAB — URINE CULTURE

## 2023-08-14 ENCOUNTER — Ambulatory Visit: Payer: No Typology Code available for payment source | Admitting: Family Medicine

## 2023-08-15 ENCOUNTER — Other Ambulatory Visit: Payer: Self-pay | Admitting: Medical Genetics

## 2023-08-15 NOTE — Progress Notes (Deleted)
 Name: Helen Rice DOB: 27-Jul-1976 MRN: [id number]  History of Present Illness: Helen Rice is a 48 y.o. female who presents today as a new patient at [facility] [place].  ***She is accompanied by ***.  She reports chief complaint of gross hematuria.   Recent history: > 03/22/2023:  - Normal renal function (creatinine 0.75, GFR 98).  > 07/20/2023: - Seen by PCP for intermittent gross hematuria starting a few weeks prior. Did have some right flank pain the night before visit. - Urine microscopy: >30 WBC/hpf, >30 RBC/hpf, few bacteria. - Urine culture positive for Proteus mirabilis. - Keflex  prescribed for hemorrhagic cystitis.  > 08/04/2023: - Seen by PCP for follow up. Denied any persistent gross hematuria following antibiotic treatment for UTI.  - Urine microscopy: >30 WBC/hpf, >30 RBC/hpf, few bacteria. - Urine culture negative. - CBC with no leukocytosis (WBC 3.7).  ***if UA micro normal today, no need for further workup as the microhematuria seen 2 weeks s/p +UC was likely due to post-infectious inflammation related to resolving hemorrhagic cystitis. In that case, can recheck here in 3 months.  Today: She {Actions; denies-reports:120008} urinary urgency, frequency, dysuria, straining to void, or sensations of incomplete emptying. She {Actions; denies-reports:120008} ***abdominal or ***flank pain. She {Actions; denies-reports:120008} fevers.  She denies history of kidney stones.  She {Actions; denies-reports:120008} history of pyelonephritis.  She {Actions; denies-reports:120008} history of recent or recurrent UTI. She {Actions; denies-reports:120008} history of GU malignancy or pelvic radiation.  She {Actions; denies-reports:120008} history of autoimmune disease. She {Actions; denies-reports:120008} history of smoking (quit ***; has smoked *** ppd x*** years). She {Actions; denies-reports:120008} known occupational risks. She {Actions; denies-reports:120008}  recent vigorous exercise which they think may be contributory to hematuria. She {Actions; denies-reports:120008} any recent trauma or prolonged pressure to the perineal area. She {Actions; denies-reports:120008} recent illness. She {Actions; denies-reports:120008} taking anticoagulants (***Aspirin 81 mg ***Coumadin ***Xarelto ***Eliquis ***Plavix).  Fall Screening: Do you usually have a device to assist in your mobility? {yes/no:20286} ***cane / ***walker / ***wheelchair  Medications: Current Outpatient Medications  Medication Sig Dispense Refill   Ascorbic Acid (VITAMIN C) 100 MG tablet Take 100 mg by mouth daily.     cephALEXin  (KEFLEX ) 500 MG capsule Take 1 capsule (500 mg total) by mouth 2 (two) times daily. 14 capsule 0   Cholecalciferol  (VITAMIN D ) 125 MCG (5000 UT) CAPS Take by mouth.     conjugated estrogens  (PREMARIN ) vaginal cream Place 1 Applicatorful vaginally daily. 42.5 g 12   desvenlafaxine  (PRISTIQ ) 100 MG 24 hr tablet Take 1 tablet (100 mg total) by mouth daily. 90 tablet 3   estradiol  (CLIMARA ) 0.1 mg/24hr patch Place 1 patch (0.1 mg total) onto the skin once a week. 12 patch 0   hydrOXYzine  (ATARAX ) 25 MG tablet Take 0.5-1 tablets (12.5-25 mg total) by mouth every 8 (eight) hours as needed for anxiety. 90 tablet 1   omeprazole  (PRILOSEC) 20 MG capsule Take 20mg  PO up to BID prn stomach pain/ reflux 60 capsule 0   Vitamin D , Ergocalciferol , (DRISDOL ) 1.25 MG (50000 UNIT) CAPS capsule Take 1 capsule (50,000 Units total) by mouth every 7 (seven) days. X12 weeks, then start OTC Vit D 400IU daily to maintain levels 12 capsule 0   No current facility-administered medications for this visit.    Allergies: Allergies  Allergen Reactions   Influenza Vaccines     Cannot take   Latex     rash   Elemental Sulfur Rash    Past Medical History:  Diagnosis  Date   Fibromyalgia    GERD (gastroesophageal reflux disease) 05/30/2017   Lyme disease    being seen by [facility]   Psoriasis    Past Surgical History:  Procedure Laterality Date   ABDOMINAL HYSTERECTOMY     APPENDECTOMY     CESAREAN SECTION     CHOLECYSTECTOMY     COLONOSCOPY     upper/lower   GIVENS CAPSULE STUDY N/A 06/21/2017   Procedure: GIVENS CAPSULE STUDY;  Surgeon: Golda Claudis PENNER, MD;  Location: [facility];  Service: Endoscopy;  Laterality: N/A;   Family History  Problem Relation Age of Onset   Cancer Father    Psoriasis Father    Autoimmune disease Father    Breast cancer Sister    Heart attack Brother 82       had a second MI which required quad bypass 21yr   Heart attack Brother 68       MI w/ stent   Thyroid  disease Maternal Aunt    Heart disease Maternal Grandmother    Heart disease Maternal Grandfather    Ovarian cancer Paternal Grandmother 94   Ovarian cancer Paternal Grandfather    Social History   Socioeconomic History   Marital status: Married    Spouse name: Not on file   Number of children: 1   Years of education: GED   Highest education level: Not on file  Occupational History   Occupation: [employer]  Tobacco Use   Smoking status: Former    Current packs/day: 0.00    Average packs/day: 1.5 packs/day for 15.0 years (22.5 ttl pk-yrs)    Types: Cigarettes    Start date: 2001    Quit date: 2016    Years since quitting: 9.0   Smokeless tobacco: Never  Vaping Use   Vaping status: Every Day  Substance and Sexual Activity   Alcohol  use: Yes    Comment: occ   Drug use: No   Sexual activity: Yes    Birth control/protection: Surgical  Other Topics Concern   Not on file  Social History Narrative   Right handed    Caffeine use: Drinks 1 cup coffee per day   1-3 sodas per day   Lives with husband   Social Drivers of Corporate Investment Banker Strain: Not on file  Food Insecurity: Not on file  Transportation Needs: Not on file  Physical Activity: Not on file  Stress: Not on file  Social Connections: Not on file  Intimate Partner  Violence: Not on file    SUBJECTIVE  Review of Systems Constitutional: Patient denies any unintentional weight loss or change in strength lntegumentary: Patient denies any rashes or pruritus Cardiovascular: Patient denies chest pain or syncope Respiratory: Patient denies shortness of breath Gastrointestinal: Patient ***denies nausea, vomiting, constipation, or diarrhea Musculoskeletal: Patient denies muscle cramps or weakness Neurologic: Patient denies convulsions or seizures Allergic/Immunologic: Patient denies recent allergic reaction(s) Hematologic/Lymphatic: Patient denies bleeding tendencies Endocrine: Patient denies heat/cold intolerance  GU: As per HPI.  OBJECTIVE There were no vitals filed for this visit. There is no height or weight on file to calculate BMI.  Physical Examination Constitutional: No obvious distress; patient is non-toxic appearing  Cardiovascular: No visible lower extremity edema.  Respiratory: The patient does not have audible wheezing/stridor; respirations do not appear labored  Gastrointestinal: Abdomen non-distended Musculoskeletal: Normal ROM of UEs  Skin: No obvious rashes/open sores  Neurologic: CN 2-12 grossly intact Psychiatric: Answered questions appropriately with normal affect  Hematologic/Lymphatic/Immunologic:  No obvious bruises or sites of spontaneous bleeding  UA: ***negative / *** WBC/hpf, *** RBC/hpf, *** bacteria ***Urine microscopy: ***negative / *** WBC/hpf, *** RBC/hpf, *** bacteria ***with no evidence of UTI ***with no evidence of microscopic hematuria ***otherwise unremarkable  PVR: *** ml  ASSESSMENT No diagnosis found.  For management of gross hematuria, we discussed possible etiologies including but not limited to: vigorous exercise, sexual activity, stone, trauma, blood thinner use, urinary tract infection, kidney function, ***BPH, ***radiation cystitis, malignancy. ***We discussed pt's smoking as a risk factor for GU  cancer and encouraged ***continued smoking cessation.***  We discussed the importance of work-up including assessing the upper and lower GU tract with CT urogram and cystoscopy. We will also check ***CMP, ***CBC, and ***voided cytology.  We discussed the risk for clot retention and pt was advised to increase fluid intake to thin out clots. Pt was advised to go to the ER if they become unable to urinate due to clot retention, start having symptoms of anemia (which were discussed), or any other significant concerning acute symptoms.  Pt verbalized understanding and decided to pursue this work-up. Patient agreed to follow-up afterward to discuss the results and formulate a treatment plan based on the findings. All questions were answered.   PLAN Advised the following: CT ***ordered. Labs (CBC, CMP, ***PSA) ordered. Voided cytology ***ordered. 4. ***No follow-ups on file.  No orders of the defined types were placed in this encounter.   It has been explained that the patient is to follow regularly with their PCP in addition to all other providers involved in their care and to follow instructions provided by these respective offices. Patient advised to contact urology clinic if any urologic-pertaining questions, concerns, new symptoms or problems arise in the interim period.  There are no Patient Instructions on file for this visit.  Electronically signed by:  Lauraine KYM Oz, MSN, FNP-C, CUNP 08/15/2023 2:02 PM

## 2023-08-21 ENCOUNTER — Other Ambulatory Visit (HOSPITAL_COMMUNITY): Payer: Self-pay

## 2023-08-21 ENCOUNTER — Ambulatory Visit: Payer: No Typology Code available for payment source | Admitting: Urology

## 2023-08-21 ENCOUNTER — Ambulatory Visit (HOSPITAL_BASED_OUTPATIENT_CLINIC_OR_DEPARTMENT_OTHER)
Admission: RE | Admit: 2023-08-21 | Discharge: 2023-08-21 | Disposition: A | Discharge status: Home / Self Care | Payer: No Typology Code available for payment source | Source: Ambulatory Visit | Attending: Family Medicine | Admitting: Family Medicine

## 2023-08-21 DIAGNOSIS — Z87440 Personal history of urinary (tract) infections: Secondary | ICD-10-CM

## 2023-08-21 DIAGNOSIS — K118 Other diseases of salivary glands: Secondary | ICD-10-CM | POA: Insufficient documentation

## 2023-08-21 DIAGNOSIS — R319 Hematuria, unspecified: Secondary | ICD-10-CM

## 2023-08-21 MED ORDER — IOHEXOL 300 MG/ML  SOLN
100.0000 mL | Freq: Once | INTRAMUSCULAR | Status: AC | PRN
  Administered 2023-08-21: 75 mL via INTRAVENOUS

## 2023-08-25 ENCOUNTER — Ambulatory Visit: Payer: Self-pay | Admitting: Family Medicine

## 2023-08-25 NOTE — Telephone Encounter (Signed)
Called patient she states she is having hematuria again and her appt with urology was on 01/13 but it got cancelled due to weather and she could not get back in until 02/03. Do you think it is okay for her to wait that long or do you want to put in another referral to another urology office. Please advise.

## 2023-08-25 NOTE — Telephone Encounter (Signed)
Copied from CRM [id number]. Topic: Clinical - Red Word Triage >> Aug 25, 2023  8:22 AM Carlatta H wrote: [facility] that prompted transfer to Nurse Triage: FEB 3 Patient has a upcoming appointment with Urology//She has blood in her urine and she needs to see what she should do//   Chief Complaint: Blood in urine and cloudy urine Symptoms: blood in urine, cloudy urine Frequency: Over a month off and on Pertinent Negatives: Patient denies pain with urination, fevers, urinary frequency, nausea, vomiting, diarrhea Disposition: [] ED /[] Urgent Care (no appt availability in office) / [] Appointment(In office/virtual)/ []  [facility]/ [] Home Care/ [x] Refused Recommended Disposition /[] [facility]/ []  Follow-up with PCP Additional Notes: Patient called and advised that she was seen by her PCP office in mid December for blood in her urine.  She is referred to urology.  Due to the snow she had to reschedule her urology appt until Feb.  She has had blood in her urine again the past two days.  Patient wants to know if she needs to be referred to another urologist since this appt is so far away or if her PCP wants to see her again. Patient states that her urine is mostly always cloudy and sometimes has blood in it, then it seems to clear up, and then it comes back. She does endorse some right sided pain that it intermittent.  Patient states that she has been drinking more water but it doesn't feel like urinary frequency like with typical UTIs.  Patient was advised that the recommendation is to be seen in the next 24 hours but patient did not want to make an appointment until her PCP advises if she needs to come back in or if her PCP can refer her to another urologist that can see her sooner than Feb 3rd.  She just wants some advice from her PCP if it is okay to wait until Feb 3rd for the urology appointment.  Patient also was wondering about CT results from a CT on 08/21/2023 if her PCP could advise on  that.  Patient is advised that if anything gets worse to go to the emergency room.  Patient verbalized understanding.  Reason for Disposition  Blood in urine  (Exception: Could be normal menstrual bleeding.)  Answer Assessment - Initial Assessment Questions 1. COLOR of URINE: "Describe the color of the urine."  (e.g., tea-colored, pink, red, bloody) "Do you have blood clots in your urine?" (e.g., none, pea, grape, small coin)     Cloudy and bloody off and on 2. ONSET: "When did the bleeding start?"      Over a month ago 3. EPISODES: "How many times has there been blood in the urine?" or "How many times today?"     Only urinated once this morning and that had blood in it 4. PAIN with URINATION: "Is there any pain with passing your urine?" If Yes, ask: "How bad is the pain?"  (Scale 1-10; or mild, moderate, severe)    - MILD: Complains slightly about urination hurting.    - MODERATE: Interferes with normal activities.      - SEVERE: Excruciating, unwilling or unable to urinate because of the pain.      No 5. FEVER: "Do you have a fever?" If Yes, ask: "What is your temperature, how was it measured, and when did it start?"     No 6. ASSOCIATED SYMPTOMS: "Are you passing urine more frequently than usual?"     No 7. OTHER  SYMPTOMS: "Do you have any other symptoms?" (e.g., back/flank pain, abdomen pain, vomiting)     Just some right sided pain off and on every so often 8. PREGNANCY: "Is there any chance you are pregnant?" "When was your last menstrual period?"     No--hysterectomy  Protocols used: Urine - Blood In-A-AH

## 2023-08-25 NOTE — Telephone Encounter (Signed)
I would wait until the 2/3 appt. I seriously doubt she will find any urologist that will get her in sooner as a new patient. However, if she does find someone, let me know and glad to put in a referral there.

## 2023-08-25 NOTE — Telephone Encounter (Signed)
Patient aware and verbalized understanding. °

## 2023-08-29 ENCOUNTER — Encounter: Payer: Self-pay | Admitting: Family Medicine

## 2023-08-30 ENCOUNTER — Encounter: Payer: Self-pay | Admitting: Family Medicine

## 2023-08-30 DIAGNOSIS — K118 Other diseases of salivary glands: Secondary | ICD-10-CM

## 2023-08-31 NOTE — Progress Notes (Signed)
Name: Helen Rice DOB: 1975-12-14 MRN: [id number]  History of Present Illness: Helen Rice is a 48 y.o. female who presents today as a new patient at [facility] [place].   She reports chief complaint of gross hematuria.   Recent history: > 03/22/2023:  - Normal renal function (creatinine 0.75, GFR 98).  > 07/20/2023: - Seen by PCP for intermittent gross hematuria starting a few weeks prior. Did have some right flank pain the night before visit. - Urine microscopy: >30 WBC/hpf, >30 RBC/hpf, few bacteria. - Urine culture positive for Proteus mirabilis. - Keflex prescribed for hemorrhagic cystitis.  > 08/04/2023: - Seen by PCP for follow up. Denied any persistent gross hematuria following antibiotic treatment for UTI.  - Urine microscopy: >30 WBC/hpf, >30 RBC/hpf, few bacteria. - Urine culture negative. - CBC with no leukocytosis (WBC 3.7).  > No recent relevant imaging.  Today: She reports that the gross hematuria has recurred several times over the past month or so and sometimes lasted several days.   She states that when the gross hematuria occurs she has had no accompanying dysuria, increased urinary urgency / frequency, fevers, or acute flank pain / abdominal pain.   She denies history of kidney stones.  She denies history of GU malignancy or pelvic radiation. Had a total abdominal hysterectomy with BSO >20 years ago.  She reports history of autoimmune disease (psoriasis). Also reports chronic Lyme disease. She reports history of smoking (quit smoking cigarettes in 2016 after smoking 1.5 ppd x15 years; currently vapes with nicotine). She denies known occupational risks. She denies recent vigorous exercise which they think may be contributory to hematuria. She denies any recent trauma or prolonged pressure to the perineal area. She denies recent illness. She denies taking anticoagulants.  Fall Screening: Do you usually have a device to assist in your  mobility? No   Medications: Current Outpatient Medications  Medication Sig Dispense Refill   Ascorbic Acid (VITAMIN C) 100 MG tablet Take 100 mg by mouth daily.     Cholecalciferol (VITAMIN D) 125 MCG (5000 UT) CAPS Take by mouth.     conjugated estrogens (PREMARIN) vaginal cream Place 1 Applicatorful vaginally daily. 42.5 g 12   desvenlafaxine (PRISTIQ) 100 MG 24 hr tablet Take 1 tablet (100 mg total) by mouth daily. 90 tablet 3   estradiol (CLIMARA) 0.1 mg/24hr patch Place 1 patch (0.1 mg total) onto the skin once a week. 12 patch 0   hydrOXYzine (ATARAX) 25 MG tablet Take 0.5-1 tablets (12.5-25 mg total) by mouth every 8 (eight) hours as needed for anxiety. 90 tablet 1   omeprazole (PRILOSEC) 20 MG capsule Take 20mg  PO up to BID prn stomach pain/ reflux 60 capsule 0   Vitamin D, Ergocalciferol, (DRISDOL) 1.25 MG (50000 UNIT) CAPS capsule Take 1 capsule (50,000 Units total) by mouth every 7 (seven) days. X12 weeks, then start OTC Vit D 400IU daily to maintain levels 12 capsule 0   No current facility-administered medications for this visit.    Allergies: Allergies  Allergen Reactions   Influenza Vaccines     Cannot take   Latex     rash   Elemental Sulfur Rash    Past Medical History:  Diagnosis Date   Fibromyalgia    GERD (gastroesophageal reflux disease) 05/30/2017   Lyme disease    being seen by [facility]   Psoriasis    Past Surgical History:  Procedure Laterality Date   ABDOMINAL HYSTERECTOMY     APPENDECTOMY  CESAREAN SECTION     CHOLECYSTECTOMY     COLONOSCOPY     upper/lower   GIVENS CAPSULE STUDY N/A 06/21/2017   Procedure: GIVENS CAPSULE STUDY;  Surgeon: Malissa Hippo, MD;  Location: [facility];  Service: Endoscopy;  Laterality: N/A;   Family History  Problem Relation Age of Onset   Cancer Father    Psoriasis Father    Autoimmune disease Father    Breast cancer Sister    Heart attack Brother 79       had a second MI which  required quad bypass 53yr   Heart attack Brother 27       MI w/ stent   Thyroid disease Maternal Aunt    Heart disease Maternal Grandmother    Heart disease Maternal Grandfather    Ovarian cancer Paternal Grandmother 97   Ovarian cancer Paternal Grandfather    Social History   Socioeconomic History   Marital status: Married    Spouse name: Not on file   Number of children: 1   Years of education: GED   Highest education level: Not on file  Occupational History   Occupation: [employer]  Tobacco Use   Smoking status: Former    Current packs/day: 0.00    Average packs/day: 1.5 packs/day for 15.0 years (22.5 ttl pk-yrs)    Types: Cigarettes    Start date: 2001    Quit date: 2016    Years since quitting: 9.0   Smokeless tobacco: Never  Vaping Use   Vaping status: Every Day  Substance and Sexual Activity   Alcohol use: Yes    Comment: occ   Drug use: No   Sexual activity: Yes    Birth control/protection: Surgical  Other Topics Concern   Not on file  Social History Narrative   Right handed    Caffeine use: Drinks 1 cup coffee per day   1-3 sodas per day   Lives with husband   Social Drivers of Corporate investment banker Strain: Not on file  Food Insecurity: Not on file  Transportation Needs: Not on file  Physical Activity: Not on file  Stress: Not on file  Social Connections: Not on file  Intimate Partner Violence: Not on file    SUBJECTIVE  Review of Systems Constitutional: Patient denies any unintentional weight loss or change in strength lntegumentary: Patient denies any rashes or pruritus Cardiovascular: Patient denies chest pain or syncope Respiratory: Patient denies shortness of breath Gastrointestinal: Patient denies nausea, vomiting, constipation, or diarrhea Musculoskeletal: Patient denies muscle cramps or weakness Neurologic: Patient denies convulsions or seizures Allergic/Immunologic: Patient denies recent allergic reaction(s) Hematologic/Lymphatic:  Patient denies bleeding tendencies Endocrine: Patient denies heat/cold intolerance  GU: As per HPI.  OBJECTIVE Vitals:   09/11/23 0905  BP: 128/83  Pulse: (!) 101  Temp: 98.8 F (37.1 C)   There is no height or weight on file to calculate BMI.  Physical Examination Constitutional: No obvious distress; patient is non-toxic appearing  Cardiovascular: No visible lower extremity edema.  Respiratory: The patient does not have audible wheezing/stridor; respirations do not appear labored  Gastrointestinal: Abdomen non-distended Musculoskeletal: Normal ROM of UEs  Skin: No obvious rashes/open sores  Neurologic: CN 2-12 grossly intact Psychiatric: Answered questions appropriately with normal affect  Hematologic/Lymphatic/Immunologic: No obvious bruises or sites of spontaneous bleeding  Urine microscopy: >30 WBC/hpf, 11-30 RBC/hpf, few bacteria  ASSESSMENT Gross hematuria - Plan: Urinalysis, Routine w reflex microscopic, Cytology, urine, Urine Culture, CT HEMATURIA WORKUP, Basic metabolic panel  Former smoker - Plan: Cytology, urine, Urine Culture, CT HEMATURIA WORKUP  Difficult airway for intubation, sequela  We discussed history in detail.  For management of gross hematuria, we discussed possible etiologies including but not limited to: vigorous exercise, sexual activity, stone, trauma, blood thinner use, urinary tract infection, kidney function, malignancy. We discussed pt's nicotine use as a risk factor for GU cancer and encouraged continued cessation.  We discussed the importance of work-up including assessing the upper and lower GU tract with CT hematuria protocol and cystoscopy. We will also check BMP and voided cytology.  We discussed the risk for clot retention and pt was advised to increase fluid intake to thin out clots. Pt was advised to go to the ER if they become unable to urinate due to clot retention, start having symptoms of anemia (which were discussed), or any other  significant concerning acute symptoms.  Pt verbalized understanding and decided to pursue this work-up. Patient agreed to follow-up afterward to discuss the results and formulate a treatment plan based on the findings. All questions were answered.   PLAN Advised the following: Urine culture. Voided cytology. BMP. CT hematuria protocol. 5. Return for 1st available cystoscopy with any urology MD.  Orders Placed This Encounter  Procedures   Urine Culture   CT HEMATURIA WORKUP    Standing Status:   Future    Expiration Date:   09/10/2024    Reason for Exam (SYMPTOM  OR DIAGNOSIS REQUIRED):   Gross hematuria    Is patient pregnant?:   No    Preferred imaging location?:   [facility]   Urinalysis, Routine w reflex microscopic   Basic metabolic panel   Total time spent caring for the patient today was over 45 minutes. This includes time spent on the date of the visit reviewing the patient's chart before the visit, time spent during the visit, and time spent after the visit on documentation. Over 50% of that time was spent in face-to-face time with this patient for direct counseling. E&M based on time and complexity of medical decision making.  It has been explained that the patient is to follow regularly with their PCP in addition to all other providers involved in their care and to follow instructions provided by these respective offices. Patient advised to contact urology clinic if any urologic-pertaining questions, concerns, new symptoms or problems arise in the interim period.  There are no Patient Instructions on file for this visit.  Electronically signed by:  Donnita Falls, MSN, FNP-C, CUNP 09/11/2023 10:13 AM

## 2023-09-01 ENCOUNTER — Telehealth: Payer: Self-pay | Admitting: Family Medicine

## 2023-09-01 NOTE — Telephone Encounter (Unsigned)
Copied from CRM [id number]. Topic: Referral - Request for Referral >> Sep 01, 2023  1:18 PM Fredrica W wrote: Did the patient discuss referral with their provider in the last year? Yes (If No - schedule appointment) (If Yes - send message)  Appointment offered? No  Type of order/referral and detailed reason for visit: ENT  Preference of office, provider, location: [facility] Fax to [phone] Appt for scheduled for Feb  If referral order, have you been seen by this specialty before? No (If Yes, this issue or another issue? When? Where?  Can we respond through MyChart? Yes

## 2023-09-11 ENCOUNTER — Encounter: Payer: Self-pay | Admitting: Urology

## 2023-09-11 ENCOUNTER — Ambulatory Visit (INDEPENDENT_AMBULATORY_CARE_PROVIDER_SITE_OTHER): Payer: No Typology Code available for payment source | Admitting: Urology

## 2023-09-11 ENCOUNTER — Other Ambulatory Visit (HOSPITAL_COMMUNITY)
Admission: RE | Admit: 2023-09-11 | Discharge: 2023-09-11 | Disposition: A | Discharge status: Home / Self Care | Payer: Self-pay | Source: Ambulatory Visit | Attending: Medical Genetics | Admitting: Medical Genetics

## 2023-09-11 VITALS — BP 128/83 | HR 101 | Temp 98.8°F

## 2023-09-11 DIAGNOSIS — T884XXS Failed or difficult intubation, sequela: Secondary | ICD-10-CM

## 2023-09-11 DIAGNOSIS — R310 Gross hematuria: Secondary | ICD-10-CM

## 2023-09-11 DIAGNOSIS — Z87891 Personal history of nicotine dependence: Secondary | ICD-10-CM | POA: Diagnosis not present

## 2023-09-11 DIAGNOSIS — T884XXA Failed or difficult intubation, initial encounter: Secondary | ICD-10-CM | POA: Insufficient documentation

## 2023-09-11 LAB — URINALYSIS, ROUTINE W REFLEX MICROSCOPIC
Bilirubin, UA: NEGATIVE
Glucose, UA: NEGATIVE
Ketones, UA: NEGATIVE
Nitrite, UA: NEGATIVE
Protein,UA: NEGATIVE
Specific Gravity, UA: 1.015 (ref 1.005–1.030)
Urobilinogen, Ur: 0.2 mg/dL (ref 0.2–1.0)
pH, UA: 6.5 (ref 5.0–7.5)

## 2023-09-11 LAB — MICROSCOPIC EXAMINATION: WBC, UA: >30 /[HPF] — AB (ref 0–5)

## 2023-09-12 LAB — BASIC METABOLIC PANEL
BUN/Creatinine Ratio: 13 (ref 9–23)
BUN: 11 mg/dL (ref 6–24)
CO2: 24 mmol/L (ref 20–29)
Calcium: 9.6 mg/dL (ref 8.7–10.2)
Chloride: 101 mmol/L (ref 96–106)
Creatinine, Ser: 0.86 mg/dL (ref 0.57–1.00)
Glucose: 85 mg/dL (ref 70–99)
Potassium: 4.4 mmol/L (ref 3.5–5.2)
Sodium: 142 mmol/L (ref 134–144)
eGFR: 83 mL/min/{1.73_m2} (ref >59)

## 2023-09-13 LAB — CYTOLOGY, URINE

## 2023-09-14 LAB — URINE CULTURE

## 2023-09-18 ENCOUNTER — Ambulatory Visit: Payer: Self-pay | Admitting: Family Medicine

## 2023-09-18 NOTE — Telephone Encounter (Signed)
 Copied From CRM [id number]. Reason for Triage: Patient calling because wears contacts but just woke up today to Left eye being slightly swollen and tender, has been watering intermittently. Disruptive to work, would like to be seen today. [phone]   Chief Complaint: Left eye pain Symptoms: Pain, swelling, watery discharge Frequency: This morning Pertinent Negatives: Patient denies fever Disposition: [] ED /[] Urgent Care (no appt availability in office) / [x] Appointment(In office/virtual)/ []  [facility]/ [] Home Care/ [] Refused Recommended Disposition /[] [facility]/ []  Follow-up with PCP Additional Notes: Patient called in to report swelling of her left eye. Patient stated her left eye is tender to the touch and producing a watery discharge. Patient stated her right eye appears normal. Patient stated she can still open the left eye. Patient denied a fever at this time. Patient wears contacts. Advised patient to see a provider in 24 hours. Patient stated she only wanted to be seen at [facility] location. No availability with PCP. Scheduled patient for tomorrow morning with alternate provider. Advised patient to call back if symptoms worsen. Patient complied.   Reason for Disposition  [1] MILD eye pain or discomfort AND [2] wears contacts  Answer Assessment - Initial Assessment Questions 1. EYE DISCHARGE: "Is the discharge in one or both eyes?" "What color is it?" "How much is there?" "When did the discharge start?"      Clear and watery 2. REDNESS OF SCLERA: "Is the redness in one or both eyes?" "When did the redness start?"      Yes, redness only present in left eye 3. EYELIDS: "Are the eyelids red or swollen?" If Yes, ask: "How much?"      Left eyelid swollen, states she can open the eye but it's tender 4. VISION: "Is there any difficulty seeing clearly?"      Able to see clearly out of right eye, but not left eye 5. PAIN: "Is there any pain? If Yes, ask: "How bad is it?"  (Scale 1-10; or mild, moderate, severe)    - MILD (1-3): doesn't interfere with normal activities     - MODERATE (4-7): interferes with normal activities or awakens from sleep    - SEVERE (8-10): excruciating pain, unable to do any normal activities       States eye is tender to the touch and burning 6. CONTACT LENS: "Do you wear contacts?"     Yes 7. OTHER SYMPTOMS: "Do you have any other symptoms?" (e.g., fever, runny nose, cough)     States she just got over the flu this past week, cough and congestion, but no fever at this time  Protocols used: Eye - Pus or Discharge-A-AH

## 2023-09-19 ENCOUNTER — Encounter: Payer: Self-pay | Admitting: Nurse Practitioner

## 2023-09-19 ENCOUNTER — Ambulatory Visit (INDEPENDENT_AMBULATORY_CARE_PROVIDER_SITE_OTHER): Payer: No Typology Code available for payment source | Admitting: Nurse Practitioner

## 2023-09-19 VITALS — BP 123/79 | HR 67 | Temp 97.5°F | Ht 62.0 in | Wt 132.0 lb

## 2023-09-19 DIAGNOSIS — H5712 Ocular pain, left eye: Secondary | ICD-10-CM

## 2023-09-19 MED ORDER — PREDNISONE 20 MG PO TABS: 40.0000 mg | ORAL_TABLET | Freq: Every day | ORAL | 0 refills | Status: AC

## 2023-09-19 NOTE — Progress Notes (Signed)
Subjective:    Patient ID: Helen Rice, female    DOB: October 28, 1975, 48 y.o.   MRN: [id number]   Chief Complaint: Left eye pain (Difficulty seeing out of eye)   Eye Pain  The left eye is affected. This is a new problem. The current episode started yesterday. The problem occurs intermittently. The pain is at a severity of 0/10. There is No known exposure to pink eye. She Does not wear contacts. Associated symptoms include blurred vision and photophobia. Pertinent negatives include no eye discharge. She has tried nothing for the symptoms. The treatment provided moderate relief.    Patient Active Problem List   Diagnosis Date Noted   Difficult airway for intubation 09/11/2023   Pain of upper abdomen 02/28/2019   Generalized anxiety disorder 02/28/2019   Other headache syndrome 12/25/2018   Transient alteration of awareness 09/25/2018   Lower back pain 09/25/2018   Leg weakness 09/25/2018   Anxiety 09/25/2018   Vitamin D deficiency 08/16/2018   Chronic cholecystitis 05/21/2018   Gallbladder polyp 05/21/2018   Guaiac positive stools 06/15/2017   GERD (gastroesophageal reflux disease) 05/30/2017       Review of Systems  Eyes:  Positive for blurred vision, photophobia and pain. Negative for discharge.       Objective:   Physical Exam Constitutional:      Appearance: Normal appearance.  Eyes:     General: No scleral icterus.       Right eye: No discharge.        Left eye: No discharge.     Pupils: Pupils are equal, round, and reactive to light.     Comments: Mild upper lid edema on left No scleral injection  Cardiovascular:     Rate and Rhythm: Normal rate.     Heart sounds: Normal heart sounds.  Skin:    General: Skin is warm.  Neurological:     General: No focal deficit present.     Mental Status: She is alert and oriented to person, place, and time.  Psychiatric:        Mood and Affect: Mood normal.        Behavior: Behavior normal.     BP 123/79   Pulse 67    Temp (!) 97.5 F (36.4 C) (Temporal)   Ht 5\' 2"  (1.575 m)   Wt 132 lb (59.9 kg)   SpO2 98%   BMI 24.14 kg/m        Assessment & Plan:   Helen Rice in today with chief complaint of Left eye pain (Difficulty seeing out of eye)   1. Pain of left eye (Primary) Cool compresses Do not wear contact lens for a few days If worsens or does not improve need to see eye doctor or go to the ED Avoid rubbing eye.   Meds ordered this encounter  Medications   predniSONE (DELTASONE) 20 MG tablet    Sig: Take 2 tablets (40 mg total) by mouth daily with breakfast for 5 days. 2 po daily for 5 days    Dispense:  10 tablet    Refill:  0    Supervising Provider:   Arville Care A [[id number]]        The above assessment and management plan was discussed with the patient. The patient verbalized understanding of and has agreed to the management plan. Patient is aware to call the clinic if symptoms persist or worsen. Patient is aware when to return to the clinic for a follow-up  visit. Patient educated on when it is appropriate to go to the emergency department.   Mary-Margaret Daphine Deutscher, FNP

## 2023-09-21 ENCOUNTER — Ambulatory Visit (HOSPITAL_COMMUNITY)
Admission: RE | Admit: 2023-09-21 | Discharge: 2023-09-21 | Disposition: A | Discharge status: Home / Self Care | Payer: No Typology Code available for payment source | Source: Ambulatory Visit | Attending: Urology | Admitting: Urology

## 2023-09-21 ENCOUNTER — Ambulatory Visit (HOSPITAL_COMMUNITY): Admission: RE | Admit: 2023-09-21 | Payer: No Typology Code available for payment source | Source: Ambulatory Visit

## 2023-09-21 DIAGNOSIS — R310 Gross hematuria: Secondary | ICD-10-CM | POA: Insufficient documentation

## 2023-09-21 DIAGNOSIS — Z87891 Personal history of nicotine dependence: Secondary | ICD-10-CM | POA: Diagnosis present

## 2023-09-21 LAB — GENECONNECT MOLECULAR SCREEN: Genetic Analysis Overall Interpretation: NEGATIVE

## 2023-09-21 MED ORDER — IOHEXOL 300 MG/ML  SOLN
100.0000 mL | Freq: Once | INTRAMUSCULAR | Status: AC | PRN
  Administered 2023-09-21: 100 mL via INTRAVENOUS

## 2023-10-02 ENCOUNTER — Telehealth: Payer: Self-pay

## 2023-10-02 NOTE — Telephone Encounter (Signed)
 Patient is made aware and voiced understanding.

## 2023-10-02 NOTE — Telephone Encounter (Signed)
-----   Message from Donnita Falls sent at 09/13/2023  9:36 AM EST ----- Please let pt know her renal function is normal.

## 2023-10-04 NOTE — Telephone Encounter (Signed)
 [phone] -  please use this number to call patient back about her CT results

## 2023-10-16 NOTE — Progress Notes (Signed)
 History of Present Illness: 48 year old female with history of recurrent urinary tract infections as well as gross hematuria.  Here today for follow-up.  CT hematuria protocol recently performed.  This revealed rater than 5 cm right staghorn calculus.  There was no left-sided urolithiasis noted.  She is a long-term smoker.  With antibiotic management, her gross hematuria does not always clear.  She is not having significant right flank pain.  No current LUTS.   Past Medical History:  Past Medical History:  Diagnosis Date   Fibromyalgia    GERD (gastroesophageal reflux disease) 05/30/2017   Lyme disease    being seen by [facility]   Psoriasis     Past Surgical History:  Past Surgical History:  Procedure Laterality Date   ABDOMINAL HYSTERECTOMY     APPENDECTOMY     CESAREAN SECTION     CHOLECYSTECTOMY     COLONOSCOPY     upper/lower   GIVENS CAPSULE STUDY N/A 06/21/2017   Procedure: GIVENS CAPSULE STUDY;  Surgeon: Malissa Hippo, MD;  Location: [facility];  Service: Endoscopy;  Laterality: N/A;    Allergies:  Allergies  Allergen Reactions   Influenza Vaccines     Cannot take   Latex     rash   Elemental Sulfur Rash    Family History:  Family History  Problem Relation Age of Onset   Cancer Father    Psoriasis Father    Autoimmune disease Father    Breast cancer Sister    Heart attack Brother 1       had a second MI which required quad bypass 60yr   Heart attack Brother 3       MI w/ stent   Thyroid disease Maternal Aunt    Heart disease Maternal Grandmother    Heart disease Maternal Grandfather    Ovarian cancer Paternal Grandmother 62   Ovarian cancer Paternal Grandfather     Social History:  Social History   Tobacco Use   Smoking status: Former    Current packs/day: 0.00    Average packs/day: 1.5 packs/day for 15.0 years (22.5 ttl pk-yrs)    Types: Cigarettes    Start date: 2001    Quit date: 2016    Years since  quitting: 9.1   Smokeless tobacco: Never  Vaping Use   Vaping status: Every Day  Substance Use Topics   Alcohol use: Yes    Comment: occ   Drug use: No    Review of symptoms:  Constitutional:  Negative for unexplained weight loss, night sweats, fever, chills ENT:  Negative for nose bleeds, sinus pain, painful swallowing CV:  Negative for chest pain, shortness of breath, exercise intolerance, palpitations, loss of consciousness Resp:  Negative for cough, wheezing, shortness of breath GI:  Negative for nausea, vomiting, diarrhea, bloody stools GU:  Positives noted in HPI; otherwise negative for gross hematuria, dysuria, urinary incontinence Neuro:  Negative for seizures, poor balance, limb weakness, slurred speech Psych:  Negative for lack of energy, depression, anxiety Endocrine:  Negative for polydipsia, polyuria, symptoms of hypoglycemia (dizziness, hunger, sweating) Hematologic:  Negative for anemia, purpura, petechia, prolonged or excessive bleeding, use of anticoagulants  Allergic:  Negative for difficulty breathing or choking as a result of exposure to anything; no shellfish allergy; no allergic response (rash/itch) to materials, foods  Physical exam: There were no vitals taken for this visit. GENERAL APPEARANCE:  Well appearing, well developed, well nourished, NAD HEENT: Atraumatic, Normocephalic. NECK: Normal  range of motion LUNGS: Inspiratory and expiratory excursion HEART: Regular Rate  NEUROLOGIC:  Alert and oriented x 3, normal gait, CN II-XII grossly intact.  MENTAL STATUS:  Appropriate. BACK:  Non-tender to palpation.  No CVAT SKIN:  Warm, dry and intact.    Results: I have reviewed the patient's CT images with her  Urine cultures reviewed  Prior records reviewed  Most recent CBC, comprehensive metabolic profile reviewed  Prior urinalyses reviewed  Indication: Gross hematuria  After informed consent and discussion of the procedure and its risks, Zada Finders was positioned and prepped in the standard fashion.  Cystoscopy was performed with a flexible cystoscope.   Findings:  Urethra:Norma Ureteral orifices:Normally positioned, normal configuration Bladder:No urothelial abnormalities  Assessment: 1.  Gross hematuria-evaluation revealed large right staghorn renal calculus, likely source.  Cystoscopy today negative  2.  Large right staghorn with associated Proteus UTI.   Plan: 1.  Urine was cultured, she was started on Keflex twice a day for 5 days then 1 daily on suppression  2.  I will set up an appointment for her to see her urologist in [facility] at [facility] urology for further management of her staghorn calculus

## 2023-10-17 ENCOUNTER — Encounter: Payer: Self-pay | Admitting: Urology

## 2023-10-17 ENCOUNTER — Ambulatory Visit (INDEPENDENT_AMBULATORY_CARE_PROVIDER_SITE_OTHER): Payer: No Typology Code available for payment source | Admitting: Urology

## 2023-10-17 VITALS — BP 142/81 | HR 77

## 2023-10-17 DIAGNOSIS — N3001 Acute cystitis with hematuria: Secondary | ICD-10-CM

## 2023-10-17 DIAGNOSIS — N200 Calculus of kidney: Secondary | ICD-10-CM | POA: Diagnosis not present

## 2023-10-17 DIAGNOSIS — R310 Gross hematuria: Secondary | ICD-10-CM

## 2023-10-17 LAB — URINALYSIS, ROUTINE W REFLEX MICROSCOPIC
Bilirubin, UA: NEGATIVE
Glucose, UA: NEGATIVE
Ketones, UA: NEGATIVE
Nitrite, UA: POSITIVE — AB
Specific Gravity, UA: 1.02 (ref 1.005–1.030)
Urobilinogen, Ur: 0.2 mg/dL (ref 0.2–1.0)
pH, UA: 6.5 (ref 5.0–7.5)

## 2023-10-17 LAB — MICROSCOPIC EXAMINATION
RBC, Urine: >30 /HPF — AB (ref 0–2)
WBC, UA: >30 /HPF — AB (ref 0–5)

## 2023-10-17 MED ORDER — CIPROFLOXACIN HCL 500 MG PO TABS
500.0000 mg | ORAL_TABLET | Freq: Once | ORAL | Status: AC
  Administered 2023-10-17: 500 mg via ORAL

## 2023-10-17 MED ORDER — CEPHALEXIN 500 MG PO CAPS: ORAL_CAPSULE | ORAL | 1 refills | Status: DC

## 2023-10-20 LAB — URINE CULTURE

## 2023-10-23 ENCOUNTER — Encounter: Payer: Self-pay | Admitting: Urology

## 2023-10-26 ENCOUNTER — Telehealth: Payer: Self-pay | Admitting: Family Medicine

## 2023-10-26 NOTE — Telephone Encounter (Signed)
 Xray tech is faxing

## 2023-10-26 NOTE — Telephone Encounter (Signed)
 Unable to fax imaging - faxed report - patient aware

## 2023-10-26 NOTE — Telephone Encounter (Signed)
 Copied from CRM [id number]. Topic: Medical Record Request - Other >> Oct 26, 2023  2:51 PM Geroge Baseman wrote: Reason for CRM: ENT office patient was referred to needs imaging sent over from January, patient is in office right now and they would like it asap. Fax [phone] Attn: Dr Carolyne Fiscal

## 2023-11-06 ENCOUNTER — Ambulatory Visit (INDEPENDENT_AMBULATORY_CARE_PROVIDER_SITE_OTHER): Payer: No Typology Code available for payment source | Admitting: Family Medicine

## 2023-11-06 ENCOUNTER — Encounter: Payer: Self-pay | Admitting: Family Medicine

## 2023-11-06 VITALS — BP 108/70 | HR 68 | Temp 98.5°F | Ht 62.0 in | Wt 132.0 lb

## 2023-11-06 DIAGNOSIS — F331 Major depressive disorder, recurrent, moderate: Secondary | ICD-10-CM | POA: Diagnosis not present

## 2023-11-06 DIAGNOSIS — K118 Other diseases of salivary glands: Secondary | ICD-10-CM | POA: Diagnosis not present

## 2023-11-06 DIAGNOSIS — B3731 Acute candidiasis of vulva and vagina: Secondary | ICD-10-CM

## 2023-11-06 DIAGNOSIS — E559 Vitamin D deficiency, unspecified: Secondary | ICD-10-CM | POA: Diagnosis not present

## 2023-11-06 DIAGNOSIS — I700 Atherosclerosis of aorta: Secondary | ICD-10-CM | POA: Insufficient documentation

## 2023-11-06 DIAGNOSIS — N200 Calculus of kidney: Secondary | ICD-10-CM

## 2023-11-06 DIAGNOSIS — E782 Mixed hyperlipidemia: Secondary | ICD-10-CM | POA: Diagnosis not present

## 2023-11-06 MED ORDER — FLUCONAZOLE 150 MG PO TABS: 150.0000 mg | ORAL_TABLET | Freq: Once | ORAL | 0 refills | Status: AC

## 2023-11-06 MED ORDER — BUPROPION HCL ER (XL) 150 MG PO TB24: 150.0000 mg | ORAL_TABLET | Freq: Every morning | ORAL | 3 refills | Status: DC

## 2023-11-06 NOTE — Progress Notes (Signed)
 Subjective: CC: Follow-up depression PCP: Raliegh Ip, DO ZOX:WRUE A Helen Rice is a 48 y.o. female presenting to clinic today for:  1.  Major depressive disorder Patient's Pristiq was increased to 100 mg last visit.  She reports some days it seems to be helpful and some days not.  She had a discussion with one of her friends which is taking the same medication as her and they recommended she consider asking for Wellbutrin because it "made all the difference for her".  She would really like to try that.  She reports ongoing concerns about the mass in her right cheek.  This is going to be looked at further with an MRI by her ENT soon at [facility].  She also has an upcoming surgery to remove a very large staghorn calculus in her right kidney.  She read somewhere that staghorn colliculi could be resultant of vitamin D use.  The surgery is apparently going to be a two-step surgery because of the size of it.  She has noticed clearing of her urine after being placed on Keflex but this is also resulted in yeast vaginitis.  On her CT scan of the kidneys she was noted to have aortic atherosclerosis and she wanted to talk about that further today   ROS: Per HPI  Allergies  Allergen Reactions   Influenza Vaccines     Cannot take   Latex     rash   Elemental Sulfur Rash   Past Medical History:  Diagnosis Date   Fibromyalgia    GERD (gastroesophageal reflux disease) 05/30/2017   Lyme disease    being seen by [facility]   Psoriasis     Current Outpatient Medications:    Ascorbic Acid (VITAMIN C) 100 MG tablet, Take 100 mg by mouth daily., Disp: , Rfl:    cephALEXin (KEFLEX) 500 MG capsule, 1 p.o. every 12 hours x 5 days, then 1 p.o. daily until you see a specialist in [place], Disp: 40 capsule, Rfl: 1   Cholecalciferol (VITAMIN D) 125 MCG (5000 UT) CAPS, Take by mouth., Disp: , Rfl:    conjugated estrogens (PREMARIN) vaginal cream, Place 1 Applicatorful vaginally daily.,  Disp: 42.5 g, Rfl: 12   desvenlafaxine (PRISTIQ) 100 MG 24 hr tablet, Take 1 tablet (100 mg total) by mouth daily., Disp: 90 tablet, Rfl: 3   estradiol (CLIMARA) 0.1 mg/24hr patch, Place 1 patch (0.1 mg total) onto the skin once a week., Disp: 12 patch, Rfl: 0   hydrOXYzine (ATARAX) 25 MG tablet, Take 0.5-1 tablets (12.5-25 mg total) by mouth every 8 (eight) hours as needed for anxiety., Disp: 90 tablet, Rfl: 1   omeprazole (PRILOSEC) 20 MG capsule, Take 20mg  PO up to BID prn stomach pain/ reflux, Disp: 60 capsule, Rfl: 0   Vitamin D, Ergocalciferol, (DRISDOL) 1.25 MG (50000 UNIT) CAPS capsule, Take 1 capsule (50,000 Units total) by mouth every 7 (seven) days. X12 weeks, then start OTC Vit D 400IU daily to maintain levels, Disp: 12 capsule, Rfl: 0 Social History   Socioeconomic History   Marital status: Married    Spouse name: Not on file   Number of children: 1   Years of education: GED   Highest education level: Not on file  Occupational History   Occupation: [employer]  Tobacco Use   Smoking status: Former    Current packs/day: 0.00    Average packs/day: 1.5 packs/day for 15.0 years (22.5 ttl pk-yrs)    Types: Cigarettes    Start date:  2001    Quit date: 2016    Years since quitting: 9.2   Smokeless tobacco: Never  Vaping Use   Vaping status: Every Day  Substance and Sexual Activity   Alcohol use: Yes    Comment: occ   Drug use: No   Sexual activity: Yes    Birth control/protection: Surgical  Other Topics Concern   Not on file  Social History Narrative   Right handed    Caffeine use: Drinks 1 cup coffee per day   1-3 sodas per day   Lives with husband   Social Drivers of Corporate investment banker Strain: Not on file  Food Insecurity: Not on file  Transportation Needs: Not on file  Physical Activity: Not on file  Stress: Not on file  Social Connections: Not on file  Intimate Partner Violence: Not on file   Family History  Problem Relation Age of Onset   Cancer Father     Psoriasis Father    Autoimmune disease Father    Breast cancer Sister    Heart attack Brother 54       had a second MI which required quad bypass 56yr   Heart attack Brother 25       MI w/ stent   Thyroid disease Maternal Aunt    Heart disease Maternal Grandmother    Heart disease Maternal Grandfather    Ovarian cancer Paternal Grandmother 23   Ovarian cancer Paternal Grandfather     Objective: Office vital signs reviewed. BP 108/70   Pulse 68   Temp 98.5 F (36.9 C)   Ht 5\' 2"  (1.575 m)   Wt 132 lb (59.9 kg)   SpO2 99%   BMI 24.14 kg/m   Physical Examination:  General: Awake, alert, well nourished, No acute distress HEENT: No gross facial swelling.  Moist mucous membranes.  Sclera white Cardio: regular rate and rhythm, S1S2 heard, no murmurs appreciated Pulm: clear to auscultation bilaterally, no wheezes, rhonchi or rales; normal work of breathing on room air Psych: Mood stable, speech normal, affect appropriate     11/06/2023    8:16 AM 09/19/2023    9:48 AM 08/04/2023    8:06 AM  Depression screen PHQ 2/9  Decreased Interest 1 0 1  Down, Depressed, Hopeless 1 0 1  PHQ - 2 Score 2 0 2  Altered sleeping 0  3  Tired, decreased energy 2  0  Change in appetite 1  0  Feeling bad or failure about yourself  0  0  Trouble concentrating 1  0  Moving slowly or fidgety/restless 0  0  Suicidal thoughts 0  0  PHQ-9 Score 6  5  Difficult doing work/chores Somewhat difficult  Not difficult at all      11/06/2023    8:16 AM 08/04/2023    8:06 AM 03/22/2023    4:21 PM 01/09/2023   12:15 PM  GAD 7 : Generalized Anxiety Score  Nervous, Anxious, on Edge 1 0 1 0  Control/stop worrying 0 0 1 0  Worry too much - different things 1 0 1 0  Trouble relaxing 0 3 1 0  Restless 0 1 1 0  Easily annoyed or irritable 1 2 1 1   Afraid - awful might happen 0 0 1 1  Total GAD 7 Score 3 6 7 2   Anxiety Difficulty Somewhat difficult Not difficult at all Extremely difficult Not difficult  at all      Assessment/ Plan: 48 y.o. female  Moderate episode of recurrent major depressive disorder (HCC) - Plan: buPROPion (WELLBUTRIN XL) 150 MG 24 hr tablet  Mass of salivary gland  Vitamin D deficiency - Plan: VITAMIN D 25 Hydroxy (Vit-D Deficiency, Fractures), CANCELED: VITAMIN D 25 Hydroxy (Vit-D Deficiency, Fractures)  Mixed hyperlipidemia - Plan: Lipid Panel, CANCELED: Lipid Panel  Aortic atherosclerosis (HCC)  Staghorn calculus  Yeast vaginitis - Plan: fluconazole (DIFLUCAN) 150 MG tablet  Wellbutrin added.  Continue Pristiq at 100 mg daily.  Keep ENT follow-up for salivary gland mass.  Please have them send me MRI once available  Future orders for vitamin D and fasting lipid placed.  We discussed what aortic atherosclerosis is and how we might manage it with cholesterol medications  No apparent contraindications to surgery for staghorn calculus removal.  Diflucan sent for yeast vaginitis secondary to antibiotics  Follow-up in 8 weeks to review how the Wellbutrin is working for her  Raliegh Ip, DO [facility] [phone]

## 2023-11-10 ENCOUNTER — Other Ambulatory Visit

## 2023-11-10 DIAGNOSIS — E782 Mixed hyperlipidemia: Secondary | ICD-10-CM

## 2023-11-10 DIAGNOSIS — E559 Vitamin D deficiency, unspecified: Secondary | ICD-10-CM

## 2023-11-11 LAB — LIPID PANEL
Chol/HDL Ratio: 4.8 ratio — ABNORMAL HIGH (ref 0.0–4.4)
Cholesterol, Total: 220 mg/dL — ABNORMAL HIGH (ref 100–199)
HDL: 46 mg/dL (ref >39)
LDL Chol Calc (NIH): 142 mg/dL — ABNORMAL HIGH (ref 0–99)
Triglycerides: 177 mg/dL — ABNORMAL HIGH (ref 0–149)
VLDL Cholesterol Cal: 32 mg/dL (ref 5–40)

## 2023-11-11 LAB — VITAMIN D 25 HYDROXY (VIT D DEFICIENCY, FRACTURES): Vit D, 25-Hydroxy: 31.3 ng/mL (ref 30.0–100.0)

## 2023-11-13 ENCOUNTER — Encounter: Payer: Self-pay | Admitting: Family Medicine

## 2023-11-15 ENCOUNTER — Other Ambulatory Visit (HOSPITAL_COMMUNITY): Payer: Self-pay | Admitting: Urology

## 2023-11-15 ENCOUNTER — Other Ambulatory Visit: Payer: Self-pay | Admitting: Urology

## 2023-11-15 DIAGNOSIS — N200 Calculus of kidney: Secondary | ICD-10-CM

## 2024-01-02 ENCOUNTER — Ambulatory Visit: Admitting: Family Medicine

## 2024-01-04 NOTE — Patient Instructions (Signed)
 SURGICAL WAITING ROOM VISITATION  Patients having surgery or a procedure may have no more than 2 support people in the waiting area - these visitors may rotate.    Children under the age of 35 must have an adult with them who is not the patient.   Visitors with respiratory illnesses are discouraged from visiting and should remain at home.  If the patient needs to stay at the hospital during part of their recovery, the visitor guidelines for inpatient rooms apply. Pre-op nurse will coordinate an appropriate time for 1 support person to accompany patient in pre-op.  This support person may not rotate.    Please refer to the [facility] website for the visitor guidelines for Inpatients (after your surgery is over and you are in a regular room).       Your procedure is scheduled on: 01-12-24   Report to [facility] Main Entrance    Report to admitting at     0730  AM   Call this number if you have problems the morning of surgery [phone]   Do not eat food   or   drink liquids :After Midnight.                  If you have questions, please contact your surgeon's office.   FOLLOW  ANY ADDITIONAL PRE OP INSTRUCTIONS YOU RECEIVED FROM YOUR SURGEON'S OFFICE!!!     Oral Hygiene is also important to reduce your risk of infection.                                    Remember - BRUSH YOUR TEETH THE MORNING OF SURGERY WITH YOUR REGULAR TOOTHPASTE  DENTURES WILL BE REMOVED PRIOR TO SURGERY PLEASE DO NOT APPLY "Poly grip" OR ADHESIVES!!!   Do NOT smoke after Midnight   Stop all vitamins and herbal supplements 7 days before surgery.   Take these medicines the morning of surgery with A SIP OF WATER: Wellbutrin , cephalexin , hydroxine if needed                               You may not have any metal on your body including hair pins, jewelry, and body piercing             Do not wear make-up, lotions, powders, perfumes/cologne, or deodorant  Do not wear nail polish including  gel and S&S, artificial/acrylic nails, or any other type of covering on natural nails including finger and toenails. If you have artificial nails, gel coating, etc. that needs to be removed by a nail salon please have this removed prior to surgery or surgery may need to be canceled/ delayed if the surgeon/ anesthesia feels like they are unable to be safely monitored.   Do not shave  48 hours prior to surgery.            Do not bring valuables to the hospital. [facility] IS NOT             RESPONSIBLE   FOR VALUABLES.   Contacts, glasses, dentures or bridgework may not be worn into surgery.   Bring small overnight bag day of surgery.   DO NOT BRING YOUR HOME MEDICATIONS TO THE HOSPITAL. PHARMACY WILL DISPENSE MEDICATIONS LISTED ON YOUR MEDICATION LIST TO YOU DURING YOUR ADMISSION IN THE HOSPITAL!    Patients discharged  on the day of surgery will not be allowed to drive home.  Someone NEEDS to stay with you for the first 24 hours after anesthesia.   Special Instructions: Bring a copy of your healthcare power of attorney and living will documents the day of surgery if you haven't scanned them before.              Please read over the following fact sheets you were given: IF YOU HAVE QUESTIONS ABOUT YOUR PRE-OP INSTRUCTIONS PLEASE CALL [phone]    If you test positive for Covid or have been in contact with anyone that has tested positive in the last 10 days please notify you surgeon.    [facility] - Preparing for Surgery Before surgery, you can play an important role.  Because skin is not sterile, your skin needs to be as free of germs as possible.  You can reduce the number of germs on your skin by washing with CHG (chlorahexidine gluconate) soap before surgery.  CHG is an antiseptic cleaner which kills germs and bonds with the skin to continue killing germs even after washing. Please DO NOT use if you have an allergy to CHG or antibacterial soaps.  If your skin becomes  reddened/irritated stop using the CHG and inform your nurse when you arrive at Short Stay. Do not shave (including legs and underarms) for at least 48 hours prior to the first CHG shower.  You may shave your face/neck. Please follow these instructions carefully:  1.  Shower with CHG Soap the night before surgery and the  morning of Surgery.  2.  If you choose to wash your hair, wash your hair first as usual with your  normal  shampoo.  3.  After you shampoo, rinse your hair and body thoroughly to remove the  shampoo.                           4.  Use CHG as you would any other liquid soap.  You can apply chg directly  to the skin and wash                       Gently with a scrungie or clean washcloth.  5.  Apply the CHG Soap to your body ONLY FROM THE NECK DOWN.   Do not use on face/ open                           Wound or open sores. Avoid contact with eyes, ears mouth and genitals (private parts).                       Wash face,  Genitals (private parts) with your normal soap.             6.  Wash thoroughly, paying special attention to the area where your surgery  will be performed.  7.  Thoroughly rinse your body with warm water from the neck down.  8.  DO NOT shower/wash with your normal soap after using and rinsing off  the CHG Soap.                9.  Pat yourself dry with a clean towel.            10.  Wear clean pajamas.            11.  Place  clean sheets on your bed the night of your first shower and do not  sleep with pets. Day of Surgery : Do not apply any lotions/deodorants the morning of surgery.  Please wear clean clothes to the hospital/surgery center.  FAILURE TO FOLLOW THESE INSTRUCTIONS MAY RESULT IN THE CANCELLATION OF YOUR SURGERY PATIENT SIGNATURE_________________________________  NURSE SIGNATURE__________________________________  ________________________________________________________________________

## 2024-01-04 NOTE — Progress Notes (Signed)
 PCP - Jannell Memo, DO LOV 11-06-22 epic Cardiologist - no  PPM/ICD -  Device Orders -  Rep Notified -   Chest x-ray -  EKG -  Stress Test -  ECHO - 2017 Cardiac Cath -  CT cardiac scoring-11-16-21 epic  Sleep Study -  CPAP -   Fasting Blood Sugar -  Checks Blood Sugar _____ times a day  Blood Thinner Instructions:n/a Aspirin Instructions:n/a  ERAS Protcol - PRE-SURGERY n/a   COVID vaccine -yes  Activity--Able to climb a flight of stairs with no CP or SOB  Anesthesia review: Lyme disease, Difficult airway, Small red bumps on left of abdomen Arlington Bennetts PA saw pt. At preop. Pt. Advised to watch rash and if spreads go to urgent care or telehealth.   Patient denies shortness of breath, fever, cough and chest pain at PAT appointment   All instructions explained to the patient, with a verbal understanding of the material. Patient agrees to go over the instructions while at home for a better understanding. Patient also instructed to self quarantine after being tested for COVID-19. The opportunity to ask questions was provided.

## 2024-01-05 ENCOUNTER — Encounter (HOSPITAL_COMMUNITY): Payer: Self-pay

## 2024-01-05 ENCOUNTER — Other Ambulatory Visit: Payer: Self-pay

## 2024-01-05 ENCOUNTER — Encounter (HOSPITAL_COMMUNITY)
Admission: RE | Admit: 2024-01-05 | Discharge: 2024-01-05 | Disposition: A | Discharge status: Home / Self Care | Source: Ambulatory Visit | Attending: Urology | Admitting: Urology

## 2024-01-05 VITALS — BP 134/80 | HR 70 | Temp 98.4°F | Resp 16 | Ht 62.0 in | Wt 133.0 lb

## 2024-01-05 DIAGNOSIS — Z01812 Encounter for preprocedural laboratory examination: Secondary | ICD-10-CM | POA: Insufficient documentation

## 2024-01-05 DIAGNOSIS — Z01818 Encounter for other preprocedural examination: Secondary | ICD-10-CM

## 2024-01-05 HISTORY — DX: Anxiety disorder, unspecified: F41.9

## 2024-01-05 HISTORY — DX: Depression, unspecified: F32.A

## 2024-01-05 HISTORY — DX: Anemia, unspecified: D64.9

## 2024-01-05 HISTORY — DX: Pneumonia, unspecified organism: J18.9

## 2024-01-05 HISTORY — DX: Personal history of urinary calculi: Z87.442

## 2024-01-05 HISTORY — DX: Failed or difficult intubation, initial encounter: T88.4XXA

## 2024-01-05 LAB — CBC
HCT: 41.2 % (ref 36.0–46.0)
Hemoglobin: 13.6 g/dL (ref 12.0–15.0)
MCH: 30.3 pg (ref 26.0–34.0)
MCHC: 33 g/dL (ref 30.0–36.0)
MCV: 91.8 fL (ref 80.0–100.0)
Platelets: 274 10*3/uL (ref 150–400)
RBC: 4.49 MIL/uL (ref 3.87–5.11)
RDW: 13.6 % (ref 11.5–15.5)
WBC: 5.7 10*3/uL (ref 4.0–10.5)
nRBC: 0 % (ref 0.0–0.2)

## 2024-01-08 ENCOUNTER — Telehealth: Admitting: Physician Assistant

## 2024-01-08 DIAGNOSIS — B029 Zoster without complications: Secondary | ICD-10-CM

## 2024-01-08 MED ORDER — VALACYCLOVIR HCL 1 G PO TABS: 1000.0000 mg | ORAL_TABLET | Freq: Three times a day (TID) | ORAL | 0 refills | Status: AC

## 2024-01-08 NOTE — Progress Notes (Signed)
 Virtual Visit Consent   Helen Rice, you are scheduled for a virtual visit with a [facility] provider today. Just as with appointments in the office, your consent must be obtained to participate. Your consent will be active for this visit and any virtual visit you may have with one of our providers in the next 365 days. If you have a MyChart account, a copy of this consent can be sent to you electronically.  As this is a virtual visit, video technology does not allow for your provider to perform a traditional examination. This may limit your provider's ability to fully assess your condition. If your provider identifies any concerns that need to be evaluated in person or the need to arrange testing (such as labs, EKG, etc.), we will make arrangements to do so. Although advances in technology are sophisticated, we cannot ensure that it will always work on either your end or our end. If the connection with a video visit is poor, the visit may have to be switched to a telephone visit. With either a video or telephone visit, we are not always able to ensure that we have a secure connection.  By engaging in this virtual visit, you consent to the provision of healthcare and authorize for your insurance to be billed (if applicable) for the services provided during this visit. Depending on your insurance coverage, you may receive a charge related to this service.  I need to obtain your verbal consent now. Are you willing to proceed with your visit today? [name] has provided verbal consent on 01/08/2024 for a virtual visit (video or telephone). Angelia Kelp, PA-C  Date: 01/08/2024 7:55 AM   Virtual Visit via Video Note   I, Angelia Kelp, connected with  Helen Rice  ([id number], 11/29/1975) on 01/08/24 at  7:45 AM EDT by a video-enabled telemedicine application and verified that I am speaking with the correct person using two identifiers.  Location: Patient: Virtual Visit Location  Patient: Home Provider: Virtual Visit Location Provider: Home Office   I discussed the limitations of evaluation and management by telemedicine and the availability of in person appointments. The patient expressed understanding and agreed to proceed.    History of Present Illness: Helen Rice is a 48 y.o. who identifies as a female who was assigned female at birth, and is being seen today for possible shingles.  HPI: Rash This is a new problem. The current episode started in the past 7 days. The problem has been gradually worsening since onset. The affected locations include the abdomen and torso (left side of stomach wrapping around to back in a dermatomal pattern). The rash is characterized by burning, itchiness, redness and blistering. She was exposed to nothing. Pertinent negatives include no anorexia, congestion, cough, fatigue, fever or shortness of breath. Past treatments include moisturizer. The treatment provided mild relief.     Problems:  Patient Active Problem List   Diagnosis Date Noted   Aortic atherosclerosis (HCC) 11/06/2023   Difficult airway for intubation 09/11/2023   Pain of upper abdomen 02/28/2019   Generalized anxiety disorder 02/28/2019   Other headache syndrome 12/25/2018   Transient alteration of awareness 09/25/2018   Lower back pain 09/25/2018   Leg weakness 09/25/2018   Anxiety 09/25/2018   Vitamin D  deficiency 08/16/2018   Chronic cholecystitis 05/21/2018   Gallbladder polyp 05/21/2018   Guaiac positive stools 06/15/2017   GERD (gastroesophageal reflux disease) 05/30/2017    Allergies:  Allergies  Allergen Reactions  Influenza Vaccines     Sweating and chills    Elemental Sulfur Rash   Latex Rash   Medications:  Current Outpatient Medications:    buPROPion  (WELLBUTRIN  XL) 150 MG 24 hr tablet, Take 1 tablet (150 mg total) by mouth in the morning., Disp: 90 tablet, Rfl: 3   cephALEXin  (KEFLEX ) 500 MG capsule, 1 p.o. every 12 hours x 5 days, then  1 p.o. daily until you see a specialist in [place], Disp: 40 capsule, Rfl: 1   conjugated estrogens  (PREMARIN ) vaginal cream, Place 1 Applicatorful vaginally daily. (Patient taking differently: Place 1 Applicatorful vaginally every 3 (three) days.), Disp: 42.5 g, Rfl: 12   desvenlafaxine  (PRISTIQ ) 100 MG 24 hr tablet, Take 1 tablet (100 mg total) by mouth daily. (Patient taking differently: Take 100 mg by mouth at bedtime.), Disp: 90 tablet, Rfl: 3   estradiol  (CLIMARA ) 0.1 mg/24hr patch, Place 1 patch (0.1 mg total) onto the skin once a week., Disp: 12 patch, Rfl: 0   hydrOXYzine  (ATARAX ) 25 MG tablet, Take 0.5-1 tablets (12.5-25 mg total) by mouth every 8 (eight) hours as needed for anxiety., Disp: 90 tablet, Rfl: 1   valACYclovir (VALTREX) 1000 MG tablet, Take 1 tablet (1,000 mg total) by mouth 3 (three) times daily for 7 days., Disp: 21 tablet, Rfl: 0  Observations/Objective: Patient is well-developed, well-nourished in no acute distress.  Resting comfortably at home.  Head is normocephalic, atraumatic.  No labored breathing. Speech is clear and coherent with logical content.  Patient is alert and oriented at baseline.  Vesicular erythematous rash in dermatomal pattern from left upper stomach, around left flank to back just under the left shoulder blade  Assessment and Plan: 1. Herpes zoster without complication (Primary) - valACYclovir (VALTREX) 1000 MG tablet; Take 1 tablet (1,000 mg total) by mouth 3 (three) times daily for 7 days.  Dispense: 21 tablet; Refill: 0  - Valtrex prescribed - Cold compresses - Tylenol and Ibuprofen if needed - Can continue to use Aquaphor - Push fluids - Seek in person evaluation if worsening or fails to improve  Follow Up Instructions: I discussed the assessment and treatment plan with the patient. The patient was provided an opportunity to ask questions and all were answered. The patient agreed with the plan and demonstrated an understanding of the  instructions.  A copy of instructions were sent to the patient via MyChart unless otherwise noted below.    The patient was advised to call back or seek an in-person evaluation if the symptoms worsen or if the condition fails to improve as anticipated.    Angelia Kelp, PA-C

## 2024-01-08 NOTE — Patient Instructions (Signed)
 Helen Rice, thank you for joining Angelia Kelp, PA-C for today's virtual visit.  While this provider is not your primary care provider (PCP), if your PCP is located in our provider database this encounter information will be shared with them immediately following your visit.   A [facility] MyChart account gives you access to today's visit and all your visits, tests, and labs performed at [facility] " click here if you don't have a [facility] MyChart account or go to [email or web address]  Consent: (Patient) Helen Rice provided verbal consent for this virtual visit at the beginning of the encounter.  Current Medications:  Current Outpatient Medications:    valACYclovir (VALTREX) 1000 MG tablet, Take 1 tablet (1,000 mg total) by mouth 3 (three) times daily for 7 days., Disp: 21 tablet, Rfl: 0   buPROPion  (WELLBUTRIN  XL) 150 MG 24 hr tablet, Take 1 tablet (150 mg total) by mouth in the morning., Disp: 90 tablet, Rfl: 3   cephALEXin  (KEFLEX ) 500 MG capsule, 1 p.o. every 12 hours x 5 days, then 1 p.o. daily until you see a specialist in [place], Disp: 40 capsule, Rfl: 1   conjugated estrogens  (PREMARIN ) vaginal cream, Place 1 Applicatorful vaginally daily. (Patient taking differently: Place 1 Applicatorful vaginally every 3 (three) days.), Disp: 42.5 g, Rfl: 12   desvenlafaxine  (PRISTIQ ) 100 MG 24 hr tablet, Take 1 tablet (100 mg total) by mouth daily. (Patient taking differently: Take 100 mg by mouth at bedtime.), Disp: 90 tablet, Rfl: 3   estradiol  (CLIMARA ) 0.1 mg/24hr patch, Place 1 patch (0.1 mg total) onto the skin once a week., Disp: 12 patch, Rfl: 0   hydrOXYzine  (ATARAX ) 25 MG tablet, Take 0.5-1 tablets (12.5-25 mg total) by mouth every 8 (eight) hours as needed for anxiety., Disp: 90 tablet, Rfl: 1   Medications ordered in this encounter:  Meds ordered this encounter  Medications   valACYclovir (VALTREX) 1000 MG tablet    Sig: Take 1 tablet (1,000 mg  total) by mouth 3 (three) times daily for 7 days.    Dispense:  21 tablet    Refill:  0    Supervising Provider:   Corine Dice [[id number]]     *If you need refills on other medications prior to your next appointment, please contact your pharmacy*  Follow-Up: Call back or seek an in-person evaluation if the symptoms worsen or if the condition fails to improve as anticipated.  [facility] [phone]  Other Instructions Shingles  Shingles, or herpes zoster, is an infection. It gives you a skin rash and blisters. These infected areas may hurt a lot. Shingles only happens if: You've had chickenpox. You've been given a shot called a vaccine to protect you from getting chickenpox. Shingles is rare in this case. What are the causes? Shingles is caused by a germ called the varicella-zoster virus. This is the same germ that causes chickenpox. After you're exposed to the germ, it stays in your body but is dormant. This means it isn't active. Shingles happens if the germ becomes active again. This can happen years after you're first exposed to the germ. What increases the risk? You may be more likely to get shingles if: You're older than 49 years of age. You're under a lot of stress. You have a weak immune system. The immune system is your body's defense system. It may be weak if: You have human immunodeficiency virus (HIV). You have acquired immunodeficiency syndrome (AIDS). You have cancer. You  take medicines that weaken your immune system. These include organ transplant medicines. What are the signs or symptoms? The first symptoms of shingles may be itching, tingling, or pain. Your skin may feel like it's burning. A few days or weeks later, you'll get a rash. Here's what you can expect: The rash is likely to be on one side of your body. The rash may be shaped like a belt or a band. Over time, it will turn into blisters filled with fluid. The blisters will break open  and change into scabs. The scabs will dry up in about 2-3 weeks. You may also have: A fever. Chills. A headache. Nausea. How is this diagnosed? Shingles is diagnosed with a skin exam. A sample called a culture may be taken from one of your blisters and sent to a lab. This will show if you have shingles. How is this treated? The rash may last for several weeks. There's no cure for shingles, but your health care provider may give you medicines. These medicines may: Help with pain. Help with itching. Help with irritation and swelling. Help you get better sooner. Help to prevent long-term problems. If the rash is on your face, you may need to see an eye doctor or an ear, nose, and throat (ENT) doctor. Follow these instructions at home: Medicines Take your medicines only as told by your provider. Put an anti-itch cream or numbing cream on the rash or blisters as told by your provider. Relieving itching and discomfort  To help with itching: Put cold, wet cloths called cold compresses on the rash or blisters. Take a cool bath. Try adding baking soda or dry oatmeal to the water. Do not bathe in hot water. Use calamine lotion on the rash or blisters. You can get this type of lotion at the store. Blister and rash care Keep your rash covered with a loose bandage. Wear loose clothes that don't rub on your rash. Take care of your rash as told by your provider. Make sure you: Wash your hands with soap and water for at least 20 seconds before and after you change your bandage. If you can't use soap and water, use hand sanitizer. Keep your rash and blisters clean by washing them with mild soap and cool water. Change your bandage. Check your rash every day for signs of infection. Check for: More redness, swelling, or pain. Fluid or blood. Warmth. Pus or a bad smell. Do not scratch your rash. Do not pick at your blisters. To help you not scratch: Keep your fingernails clean and cut short. Try  to wear gloves or mittens when you sleep. General instructions Rest. Wash your hands often with soap and water for at least 20 seconds. If you can't use soap and water, use hand sanitizer. Washing your hands lowers your chance of getting a skin infection. Your infection can cause chickenpox in others. If you have blisters that aren't scabs yet, stay away from: Babies. Pregnant people. Children who have eczema. Older people who have organ transplants. People who have a long-term, or chronic, illness. Anyone who hasn't had chickenpox before. Anyone who hasn't gotten the chickenpox vaccine. How is this prevented? Vaccines are the best way to prevent you from getting chickenpox or shingles. Talk with your provider about getting these shots. Where to find more information Centers for Disease Control and Prevention (CDC): TonerPromos.no Contact a health care provider if: Your pain doesn't get better with medicine. Your pain doesn't get better after the rash heals.  You have any signs of infection around the rash. Your rash or blisters get worse. You have a fever or chills. Get help right away if: The rash is on your face or nose. You have pain in your face or by your eye. You lose feeling on one side of your face. You have trouble seeing. You have ear pain or ringing in your ear. This information is not intended to replace advice given to you by your health care provider. Make sure you discuss any questions you have with your health care provider. Document Revised: 04/27/2023 Document Reviewed: 09/09/2022 Elsevier Patient Education  2024 Elsevier Inc.   If you have been instructed to have an in-person evaluation today at a local Urgent Care facility, please use the link below. It will take you to a list of all of our available [facility], including address, phone number and hours of operation. Please do not delay care.  [facility]  If you or a family member do not  have a primary care provider, use the link below to schedule a visit and establish care. When you choose a [facility] primary care physician or advanced practice provider, you gain a long-term partner in health. Find a Primary Care Provider  Learn more about [facility]'s in-office and virtual care options: [facility] - Get Care Now

## 2024-01-08 NOTE — Progress Notes (Signed)
 Patient diagnosed with Shingles starting valtrex on 6/2.  Dr Freddi Jaeger made aware.  He said he will notify his office to call the patient.

## 2024-01-10 ENCOUNTER — Other Ambulatory Visit: Payer: Self-pay | Admitting: Radiology

## 2024-01-10 DIAGNOSIS — N200 Calculus of kidney: Secondary | ICD-10-CM

## 2024-01-11 NOTE — H&P (Signed)
 Chief Complaint: Right renal stone; referred for image guided right percutaneous nephrostomy/nephroureteral catheter placement prior to nephrolithotomy  Referring Provider(s): Gay,M  Supervising Physician: Elene Griffes  Patient Status: [facility] - Out-pt  TBA  History of Present Illness: Helen Rice is a 48 y.o. female smoker with PMH sig for anemia, anxiety, depression, GERD, Lyme disease, psoriasis, recent shingles and nephrolithiasis. She has had hx hematuria and prior imaging revealing  large staghorn calculus occupying the entirety of the right renal collecting system and renal pelvis, measuring 5.5 x 3.4 cm in caliber. No hydronephrosis. She is scheduled today for right percutaneous nephrostomy/nephroureteral catheter placement prior to nephrolithotomy.    Patient is Full Code  Past Medical History:  Diagnosis Date   Anemia    hx of   Anxiety    Depression    Difficult intubation    GERD (gastroesophageal reflux disease) 05/30/2017   History of kidney stones    Lyme disease    being seen by [facility]   Pneumonia    Psoriasis     Past Surgical History:  Procedure Laterality Date   ABDOMINAL HYSTERECTOMY     APPENDECTOMY     CESAREAN SECTION     CHOLECYSTECTOMY     COLONOSCOPY     upper/lower   GIVENS CAPSULE STUDY N/A 06/21/2017   Procedure: GIVENS CAPSULE STUDY;  Surgeon: Ruby Corporal, MD;  Location: [facility];  Service: Endoscopy;  Laterality: N/A;    Allergies: Influenza vaccines, Elemental sulfur, and Latex  Medications: Prior to Admission medications   Medication Sig Start Date End Date Taking? Authorizing Provider  buPROPion  (WELLBUTRIN  XL) 150 MG 24 hr tablet Take 1 tablet (150 mg total) by mouth in the morning. 11/06/23  Yes Gottschalk, Ashly M, DO  cephALEXin  (KEFLEX ) 500 MG capsule 1 p.o. every 12 hours x 5 days, then 1 p.o. daily until you see a specialist in [facility] 10/17/23  Yes Dahlstedt, Mara [place], MD  conjugated estrogens   (PREMARIN ) vaginal cream Place 1 Applicatorful vaginally daily. Patient taking differently: Place 1 Applicatorful vaginally every 3 (three) days. 01/11/21  Yes Ozan, Jennifer, DO  desvenlafaxine  (PRISTIQ ) 100 MG 24 hr tablet Take 1 tablet (100 mg total) by mouth daily. Patient taking differently: Take 100 mg by mouth at bedtime. 08/04/23  Yes Gottschalk, Sharolyn Decant M, DO  estradiol  (CLIMARA ) 0.1 mg/24hr patch Place 1 patch (0.1 mg total) onto the skin once a week. 01/23/23  Yes Gottschalk, Sharolyn Decant M, DO  hydrOXYzine  (ATARAX ) 25 MG tablet Take 0.5-1 tablets (12.5-25 mg total) by mouth every 8 (eight) hours as needed for anxiety. 03/22/23  Yes Gottschalk, Sharolyn Decant M, DO  valACYclovir (VALTREX) 1000 MG tablet Take 1 tablet (1,000 mg total) by mouth 3 (three) times daily for 7 days. 01/08/24 01/15/24  Angelia Kelp, PA-C     Family History  Problem Relation Age of Onset   Cancer Father    Psoriasis Father    Autoimmune disease Father    Breast cancer Sister    Heart attack Brother 70       had a second MI which required quad bypass 41yr   Heart attack Brother 47       MI w/ stent   Thyroid  disease Maternal Aunt    Heart disease Maternal Grandmother    Heart disease Maternal Grandfather    Ovarian cancer Paternal Grandmother 29   Ovarian cancer Paternal Grandfather     Social History   Socioeconomic History   Marital status: Married  Spouse name: Not on file   Number of children: 1   Years of education: GED   Highest education level: Not on file  Occupational History   Occupation: [employer]  Tobacco Use   Smoking status: Former    Current packs/day: 0.00    Average packs/day: 1.5 packs/day for 15.0 years (22.5 ttl pk-yrs)    Types: Cigarettes    Start date: 2001    Quit date: 2016    Years since quitting: 9.4   Smokeless tobacco: Never  Vaping Use   Vaping status: Every Day   Substances: Nicotine  Substance and Sexual Activity   Alcohol  use: Yes    Comment: occ   Drug use: No   Sexual  activity: Yes    Birth control/protection: Surgical  Other Topics Concern   Not on file  Social History Narrative   Right handed    Caffeine use: Drinks 1 cup coffee per day   1-3 sodas per day   Lives with husband   Social Drivers of Corporate investment banker Strain: Not on file  Food Insecurity: Not on file  Transportation Needs: Not on file  Physical Activity: Not on file  Stress: Not on file  Social Connections: Not on file       Review of Systems: Denies fever, headache, chest pain, dyspnea, vomiting; she has had some recent hematuria, occasional cough, cloudy urine, some intermittent abdominal/back discomfort, intermittent nausea; also with recent shingles  Vital Signs: Vitals:   01/12/24 0746  BP: (!) 140/90  Pulse: 80  Resp: 16  Temp: 98 F (36.7 C)  SpO2: 98%      Advance Care Plan: no documents on file  Physical Exam: Awake, alert.  Chest clear to auscultation bilaterally.  Heart with regular rate and rhythm.  Abdomen soft, positive bowel sounds, some mild generalized tenderness to palpation.  Mild shingles rash noted left flank/left lateral abdominal region-no significant blistering or drainage noted; no lower extremity edema  Imaging: No results found.  Labs:  CBC: Recent Labs    08/04/23 0847 01/05/24 1404  WBC 3.7 5.7  HGB 13.2 13.6  HCT 39.8 41.2  PLT 276 274    COAGS: No results for input(s): "INR", "APTT" in the last 8760 hours.  BMP: Recent Labs    03/22/23 1631 09/11/23 1003  NA 143 142  K 4.0 4.4  CL 104 101  CO2 24 24  GLUCOSE 84 85  BUN 10 11  CALCIUM 9.5 9.6  CREATININE 0.75 0.86    LIVER FUNCTION TESTS: Recent Labs    03/22/23 1631  BILITOT 0.3  AST 18  ALT 19  ALKPHOS 132*  PROT 7.0  ALBUMIN 4.8    TUMOR MARKERS: No results for input(s): "AFPTM", "CEA", "CA199", "CHROMGRNA" in the last 8760 hours.  Assessment and Plan: 48 y.o. female with PMH sig for anemia, anxiety, depression, GERD, Lyme disease,  psoriasis, recent shingles and nephrolithiasis. She has had hx hematuria and prior imaging revealing  large staghorn calculus occupying the entirety of the right renal collecting system and renal pelvis, measuring 5.5 x 3.4 cm in caliber. No hydronephrosis. She is scheduled today for right percutaneous nephrostomy/nephroureteral catheter placement prior to nephrolithotomy. Risks and benefits of right PCN placement was discussed with the patient/spouse including, but not limited to, infection, bleeding, significant bleeding causing loss or decrease in renal function or damage to adjacent structures.   All of the patient's questions were answered, patient is agreeable to proceed.  Consent signed  and in chart.      Thank you for allowing our service to participate in YURANI FETTES 's care.  Electronically Signed: D. Honore Lux, PA-C   01/11/2024, 1:11 PM      I spent a total of  25 minutes   in face to face in clinical consultation, greater than 50% of which was counseling/coordinating care for right percutaneous nephrostomy/nephroureteral catheter placement

## 2024-01-11 NOTE — Anesthesia Preprocedure Evaluation (Signed)
 Anesthesia Evaluation  Patient identified by MRN, date of birth, ID band Patient awake    Reviewed: Allergy & Precautions, NPO status , Patient's Chart, lab work & pertinent test results  History of Anesthesia Complications (+) DIFFICULT AIRWAY  Airway Mallampati: III  TM Distance: <3 FB Neck ROM: Full    Dental  (+) Dental Advisory Given   Pulmonary Current SmokerPatient did not abstain from smoking.   breath sounds clear to auscultation       Cardiovascular negative cardio ROS  Rhythm:Regular Rate:Normal  '17 ECHO: normal LVF, normal diastolic function, normal RVF, no significant valvular abnormalities   Neuro/Psych  Headaches  Anxiety Depression       GI/Hepatic Neg liver ROS,GERD  Controlled,,  Endo/Other  negative endocrine ROS    Renal/GU stones     Musculoskeletal   Abdominal   Peds  Hematology Hb 13.3, plt 267   Anesthesia Other Findings   Reproductive/Obstetrics                             Anesthesia Physical Anesthesia Plan  ASA: 3  Anesthesia Plan: General   Post-op Pain Management: Tylenol PO (pre-op)*   Induction: Intravenous  PONV Risk Score and Plan: 3 and Ondansetron, Dexamethasone, Treatment may vary due to age or medical condition and Scopolamine patch - Pre-op  Airway Management Planned: Oral ETT and Video Laryngoscope Planned  Additional Equipment: None  Intra-op Plan:   Post-operative Plan: Extubation in OR  Informed Consent: I have reviewed the patients History and Physical, chart, labs and discussed the procedure including the risks, benefits and alternatives for the proposed anesthesia with the patient or authorized representative who has indicated his/her understanding and acceptance.     Dental advisory given  Plan Discussed with: CRNA and Surgeon  Anesthesia Plan Comments: (See PAT note 01/05/2024)       Anesthesia Quick Evaluation

## 2024-01-11 NOTE — Progress Notes (Signed)
 Anesthesia Chart Review   Date/Time: 01/12/24 0930   Procedure: IR URETERAL STENT RIGHT NEW ACCESS W/O SEP NEPHROSTOMY CATH   Diagnosis: Right renal stone [N20.0]   Indications: rt pcn for or to follow   Location: [facility]       DISCUSSION:48 y.o. former smoker with h/o right renal stone scheduled for above procedure 01/12/2024.   Pt reports difficult intubation with previous cholecystectomy.  States she was told they need to use a pediatric tube.    Pt had colonoscopy 07/21/2022 with no anesthesia complications noted. Per notes general and TIVA used.   Pt had a televisit 01/08/24, diagnosed with shingles, started on Valtrex.    Addendum 01/11/2024:  Rash has improved/has not worsened, vesicles have crusted over per patient.  Rash is on the opposite side of where incision will be made, Dr. Freddi Jaeger is ok proceeding.   VS: BP 134/80   Pulse 70   Temp 36.9 C (Oral)   Resp 16   Ht 5\' 2"  (1.575 m)   Wt 60.3 kg   SpO2 100%   BMI 24.33 kg/m   PROVIDERS: Eliodoro Guerin, DO   LABS: Labs reviewed: Acceptable for surgery. (all labs ordered are listed, but only abnormal results are displayed)  Labs Reviewed  CBC     IMAGES:   EKG:   CV:  Past Medical History:  Diagnosis Date   Anemia    hx of   Anxiety    Depression    Difficult intubation    GERD (gastroesophageal reflux disease) 05/30/2017   History of kidney stones    Lyme disease    being seen by [facility]   Pneumonia    Psoriasis     Past Surgical History:  Procedure Laterality Date   ABDOMINAL HYSTERECTOMY     APPENDECTOMY     CESAREAN SECTION     CHOLECYSTECTOMY     COLONOSCOPY     upper/lower   GIVENS CAPSULE STUDY N/A 06/21/2017   Procedure: GIVENS CAPSULE STUDY;  Surgeon: Ruby Corporal, MD;  Location: [facility];  Service: Endoscopy;  Laterality: N/A;    MEDICATIONS:  buPROPion  (WELLBUTRIN  XL) 150 MG 24 hr tablet    cephALEXin  (KEFLEX ) 500 MG capsule   conjugated estrogens  (PREMARIN ) vaginal cream   desvenlafaxine  (PRISTIQ ) 100 MG 24 hr tablet   estradiol  (CLIMARA ) 0.1 mg/24hr patch   hydrOXYzine  (ATARAX ) 25 MG tablet   valACYclovir (VALTREX) 1000 MG tablet   No current facility-administered medications for this encounter.      Chick Cotton Ward, PA-C [facility] [phone]

## 2024-01-12 ENCOUNTER — Inpatient Hospital Stay (HOSPITAL_COMMUNITY)

## 2024-01-12 ENCOUNTER — Encounter (HOSPITAL_COMMUNITY): Payer: Self-pay | Admitting: Urology

## 2024-01-12 ENCOUNTER — Ambulatory Visit (HOSPITAL_COMMUNITY)
Admission: RE | Admit: 2024-01-12 | Discharge: 2024-01-12 | Disposition: A | Discharge status: Home / Self Care | Source: Ambulatory Visit | Attending: Urology | Admitting: Urology

## 2024-01-12 ENCOUNTER — Inpatient Hospital Stay (HOSPITAL_COMMUNITY)
Admission: RE | Admit: 2024-01-12 | Discharge: 2024-01-16 | DRG: 661 | Disposition: A | Discharge status: Home / Self Care | Attending: Urology | Admitting: Urology

## 2024-01-12 ENCOUNTER — Inpatient Hospital Stay (HOSPITAL_COMMUNITY): Admitting: Physician Assistant

## 2024-01-12 ENCOUNTER — Other Ambulatory Visit: Payer: Self-pay

## 2024-01-12 ENCOUNTER — Encounter (HOSPITAL_COMMUNITY)
Admission: RE | Disposition: A | Discharge status: Home / Self Care | Payer: Self-pay | Source: Home / Self Care | Attending: Urology

## 2024-01-12 ENCOUNTER — Inpatient Hospital Stay (HOSPITAL_COMMUNITY): Admitting: Anesthesiology

## 2024-01-12 DIAGNOSIS — F32A Depression, unspecified: Secondary | ICD-10-CM | POA: Diagnosis present

## 2024-01-12 DIAGNOSIS — Z91040 Latex allergy status: Secondary | ICD-10-CM

## 2024-01-12 DIAGNOSIS — K219 Gastro-esophageal reflux disease without esophagitis: Secondary | ICD-10-CM | POA: Diagnosis present

## 2024-01-12 DIAGNOSIS — N200 Calculus of kidney: Secondary | ICD-10-CM | POA: Diagnosis present

## 2024-01-12 DIAGNOSIS — F418 Other specified anxiety disorders: Secondary | ICD-10-CM

## 2024-01-12 DIAGNOSIS — F17290 Nicotine dependence, other tobacco product, uncomplicated: Secondary | ICD-10-CM | POA: Diagnosis present

## 2024-01-12 DIAGNOSIS — I700 Atherosclerosis of aorta: Secondary | ICD-10-CM | POA: Diagnosis not present

## 2024-01-12 DIAGNOSIS — E7800 Pure hypercholesterolemia, unspecified: Secondary | ICD-10-CM | POA: Diagnosis present

## 2024-01-12 DIAGNOSIS — Z87891 Personal history of nicotine dependence: Secondary | ICD-10-CM | POA: Diagnosis not present

## 2024-01-12 DIAGNOSIS — F419 Anxiety disorder, unspecified: Secondary | ICD-10-CM | POA: Diagnosis present

## 2024-01-12 HISTORY — PX: NEPHROLITHOTOMY: SHX5134

## 2024-01-12 HISTORY — PX: IR URETERAL STENT RIGHT NEW ACCESS W/O SEP NEPHROSTOMY CATH: IMG6076

## 2024-01-12 HISTORY — PX: CYSTOSCOPY/URETEROSCOPY/HOLMIUM LASER/STENT PLACEMENT: SHX6546

## 2024-01-12 LAB — CBC
HCT: 45.3 % (ref 36.0–46.0)
Hemoglobin: 14.6 g/dL (ref 12.0–15.0)
MCH: 30.4 pg (ref 26.0–34.0)
MCHC: 32.2 g/dL (ref 30.0–36.0)
MCV: 94.2 fL (ref 80.0–100.0)
Platelets: 229 10*3/uL (ref 150–400)
RBC: 4.81 MIL/uL (ref 3.87–5.11)
RDW: 13.5 % (ref 11.5–15.5)
WBC: 10.6 10*3/uL — ABNORMAL HIGH (ref 4.0–10.5)
nRBC: 0 % (ref 0.0–0.2)

## 2024-01-12 LAB — CBC WITH DIFFERENTIAL/PLATELET
Abs Immature Granulocytes: 0.02 10*3/uL (ref 0.00–0.07)
Basophils Absolute: 0 10*3/uL (ref 0.0–0.1)
Basophils Relative: 0 %
Eosinophils Absolute: 0 10*3/uL (ref 0.0–0.5)
Eosinophils Relative: 1 %
HCT: 40.6 % (ref 36.0–46.0)
Hemoglobin: 13.3 g/dL (ref 12.0–15.0)
Immature Granulocytes: 1 %
Lymphocytes Relative: 29 %
Lymphs Abs: 1.3 10*3/uL (ref 0.7–4.0)
MCH: 30.1 pg (ref 26.0–34.0)
MCHC: 32.8 g/dL (ref 30.0–36.0)
MCV: 91.9 fL (ref 80.0–100.0)
Monocytes Absolute: 0.3 10*3/uL (ref 0.1–1.0)
Monocytes Relative: 7 %
Neutro Abs: 2.7 10*3/uL (ref 1.7–7.7)
Neutrophils Relative %: 62 %
Platelets: 267 10*3/uL (ref 150–400)
RBC: 4.42 MIL/uL (ref 3.87–5.11)
RDW: 13.4 % (ref 11.5–15.5)
WBC: 4.3 10*3/uL (ref 4.0–10.5)
nRBC: 0 % (ref 0.0–0.2)

## 2024-01-12 LAB — BASIC METABOLIC PANEL WITH GFR
Anion gap: 11 (ref 5–15)
Anion gap: 11 (ref 5–15)
BUN: 13 mg/dL (ref 6–20)
BUN: 11 mg/dL (ref 6–20)
CO2: 20 mmol/L — ABNORMAL LOW (ref 22–32)
CO2: 19 mmol/L — ABNORMAL LOW (ref 22–32)
Calcium: 9.2 mg/dL (ref 8.9–10.3)
Calcium: 8.2 mg/dL — ABNORMAL LOW (ref 8.9–10.3)
Chloride: 107 mmol/L (ref 98–111)
Chloride: 111 mmol/L (ref 98–111)
Creatinine, Ser: 0.77 mg/dL (ref 0.44–1.00)
Creatinine, Ser: 0.79 mg/dL (ref 0.44–1.00)
GFR, Estimated: >60 mL/min (ref >60)
GFR, Estimated: >60 mL/min (ref >60)
Glucose, Bld: 85 mg/dL (ref 70–99)
Glucose, Bld: 119 mg/dL — ABNORMAL HIGH (ref 70–99)
Potassium: 4 mmol/L (ref 3.5–5.1)
Potassium: 4.3 mmol/L (ref 3.5–5.1)
Sodium: 138 mmol/L (ref 135–145)
Sodium: 141 mmol/L (ref 135–145)

## 2024-01-12 LAB — PROTIME-INR
INR: 0.9 (ref 0.8–1.2)
Prothrombin Time: 12.6 s (ref 11.4–15.2)

## 2024-01-12 SURGERY — NEPHROLITHOTOMY PERCUTANEOUS SECOND LOOK
Anesthesia: General | Laterality: Right

## 2024-01-12 MED ORDER — FENTANYL CITRATE (PF) 100 MCG/2ML IJ SOLN
INTRAMUSCULAR | Status: AC
  Filled 2024-01-12: qty 2

## 2024-01-12 MED ORDER — OXYCODONE HCL 5 MG PO TABS: 5.0000 mg | ORAL_TABLET | Freq: Once | ORAL | Status: DC | PRN

## 2024-01-12 MED ORDER — HYDROMORPHONE HCL 1 MG/ML IJ SOLN
0.5000 mg | INTRAMUSCULAR | Status: DC | PRN
  Administered 2024-01-14: 1 mg via INTRAVENOUS
  Filled 2024-01-12: qty 1

## 2024-01-12 MED ORDER — OXYCODONE-ACETAMINOPHEN 5-325 MG PO TABS
1.0000 | ORAL_TABLET | ORAL | Status: DC | PRN
  Administered 2024-01-12 – 2024-01-13 (×3): 1 via ORAL
  Filled 2024-01-12 (×3): qty 1

## 2024-01-12 MED ORDER — LIDOCAINE-EPINEPHRINE 1 %-1:100000 IJ SOLN
INTRAMUSCULAR | Status: AC
  Filled 2024-01-12: qty 1

## 2024-01-12 MED ORDER — ONDANSETRON HCL 4 MG/2ML IJ SOLN
INTRAMUSCULAR | Status: AC
  Filled 2024-01-12: qty 2

## 2024-01-12 MED ORDER — ONDANSETRON HCL 4 MG/2ML IJ SOLN: 4.0000 mg | INTRAMUSCULAR | Status: DC | PRN

## 2024-01-12 MED ORDER — GENTAMICIN SULFATE 40 MG/ML IJ SOLN
5.0000 mg/kg | INTRAVENOUS | Status: AC
  Administered 2024-01-12: 300 mg via INTRAVENOUS
  Filled 2024-01-12: qty 7.5

## 2024-01-12 MED ORDER — MIDAZOLAM HCL 2 MG/2ML IJ SOLN
INTRAMUSCULAR | Status: AC
Start: 2024-01-12 — End: ?
  Filled 2024-01-12: qty 2

## 2024-01-12 MED ORDER — VENLAFAXINE HCL ER 75 MG PO CP24
75.0000 mg | ORAL_CAPSULE | Freq: Every day | ORAL | Status: DC
  Administered 2024-01-12 – 2024-01-14 (×3): 75 mg via ORAL
  Filled 2024-01-12 (×3): qty 1

## 2024-01-12 MED ORDER — HYDROXYZINE HCL 25 MG PO TABS
12.5000 mg | ORAL_TABLET | Freq: Three times a day (TID) | ORAL | Status: DC | PRN
  Administered 2024-01-14: 25 mg via ORAL
  Filled 2024-01-12: qty 1

## 2024-01-12 MED ORDER — SODIUM CHLORIDE 0.9 % IV SOLN: INTRAVENOUS | Status: DC

## 2024-01-12 MED ORDER — IOHEXOL 300 MG/ML  SOLN
50.0000 mL | Freq: Once | INTRAMUSCULAR | Status: AC | PRN
  Administered 2024-01-12: 30 mL

## 2024-01-12 MED ORDER — ORAL CARE MOUTH RINSE: 15.0000 mL | Freq: Once | OROMUCOSAL | Status: AC

## 2024-01-12 MED ORDER — HYDROMORPHONE HCL 1 MG/ML IJ SOLN: 0.2500 mg | INTRAMUSCULAR | Status: DC | PRN

## 2024-01-12 MED ORDER — SODIUM CHLORIDE 0.9 % IV SOLN
INTRAVENOUS | Status: DC | PRN
Start: 2024-01-12 — End: 2024-01-12

## 2024-01-12 MED ORDER — LACTATED RINGERS IV SOLN: INTRAVENOUS | Status: DC

## 2024-01-12 MED ORDER — SODIUM CHLORIDE 0.9 % IV SOLN
INTRAVENOUS | Status: AC
  Filled 2024-01-12: qty 20

## 2024-01-12 MED ORDER — FENTANYL CITRATE (PF) 100 MCG/2ML IJ SOLN
INTRAMUSCULAR | Status: AC | PRN
  Administered 2024-01-12 (×2): 50 ug via INTRAVENOUS
  Administered 2024-01-12: 25 ug via INTRAVENOUS
  Administered 2024-01-12: 50 ug via INTRAVENOUS
  Administered 2024-01-12: 25 ug via INTRAVENOUS

## 2024-01-12 MED ORDER — BUPROPION HCL ER (XL) 150 MG PO TB24
150.0000 mg | ORAL_TABLET | Freq: Every morning | ORAL | Status: DC
  Administered 2024-01-13 – 2024-01-14 (×2): 150 mg via ORAL
  Filled 2024-01-12 (×2): qty 1

## 2024-01-12 MED ORDER — CHLORHEXIDINE GLUCONATE CLOTH 2 % EX PADS
6.0000 | MEDICATED_PAD | Freq: Every day | CUTANEOUS | Status: DC
  Administered 2024-01-12 – 2024-01-15 (×4): 6 via TOPICAL

## 2024-01-12 MED ORDER — LIDOCAINE HCL (CARDIAC) PF 100 MG/5ML IV SOSY
PREFILLED_SYRINGE | INTRAVENOUS | Status: DC | PRN
  Administered 2024-01-12: 40 mg via INTRAVENOUS

## 2024-01-12 MED ORDER — SODIUM CHLORIDE 0.9 % IR SOLN
Status: DC | PRN
Start: 2024-01-12 — End: 2024-01-12
  Administered 2024-01-12: 1000 mL

## 2024-01-12 MED ORDER — LIDOCAINE-EPINEPHRINE 1 %-1:100000 IJ SOLN
20.0000 mL | Freq: Once | INTRAMUSCULAR | Status: AC
  Administered 2024-01-12: 17 mL via INTRADERMAL

## 2024-01-12 MED ORDER — LIDOCAINE HCL (PF) 2 % IJ SOLN
INTRAMUSCULAR | Status: AC
  Filled 2024-01-12: qty 10

## 2024-01-12 MED ORDER — SODIUM CHLORIDE 0.9 % IV SOLN: 250.0000 mL | INTRAVENOUS | Status: AC | PRN

## 2024-01-12 MED ORDER — PROPOFOL 10 MG/ML IV BOLUS
INTRAVENOUS | Status: AC
  Filled 2024-01-12: qty 20

## 2024-01-12 MED ORDER — ONDANSETRON HCL 4 MG/2ML IJ SOLN
INTRAMUSCULAR | Status: DC | PRN
  Administered 2024-01-12: 4 mg via INTRAVENOUS

## 2024-01-12 MED ORDER — SODIUM CHLORIDE 0.9% FLUSH: 3.0000 mL | INTRAVENOUS | Status: DC | PRN

## 2024-01-12 MED ORDER — DEXAMETHASONE SODIUM PHOSPHATE 10 MG/ML IJ SOLN
INTRAMUSCULAR | Status: DC | PRN
  Administered 2024-01-12: 10 mg via INTRAVENOUS

## 2024-01-12 MED ORDER — ACETAMINOPHEN 325 MG PO TABS
650.0000 mg | ORAL_TABLET | ORAL | Status: DC | PRN
Start: 2024-01-12 — End: 2024-01-16
  Administered 2024-01-13 – 2024-01-16 (×4): 650 mg via ORAL
  Filled 2024-01-12 (×4): qty 2

## 2024-01-12 MED ORDER — PROPOFOL 10 MG/ML IV BOLUS
INTRAVENOUS | Status: DC | PRN
  Administered 2024-01-12: 100 mg via INTRAVENOUS
  Administered 2024-01-12: 20 mg via INTRAVENOUS

## 2024-01-12 MED ORDER — OXYCODONE HCL 5 MG/5ML PO SOLN: 5.0000 mg | Freq: Once | ORAL | Status: DC | PRN

## 2024-01-12 MED ORDER — ROCURONIUM BROMIDE 10 MG/ML (PF) SYRINGE
PREFILLED_SYRINGE | INTRAVENOUS | Status: AC
  Filled 2024-01-12: qty 10

## 2024-01-12 MED ORDER — GENTAMICIN SULFATE 40 MG/ML IJ SOLN: 5.0000 mg/kg | INTRAVENOUS | Status: DC

## 2024-01-12 MED ORDER — IOHEXOL 300 MG/ML  SOLN
INTRAMUSCULAR | Status: DC | PRN
  Administered 2024-01-12: 25 mL
  Administered 2024-01-12: 30 mL

## 2024-01-12 MED ORDER — MIDAZOLAM HCL 2 MG/2ML IJ SOLN: 0.5000 mg | Freq: Once | INTRAMUSCULAR | Status: DC | PRN

## 2024-01-12 MED ORDER — MEPERIDINE HCL 50 MG/ML IJ SOLN: 6.2500 mg | INTRAMUSCULAR | Status: DC | PRN

## 2024-01-12 MED ORDER — POLYETHYLENE GLYCOL 3350 17 G PO PACK
17.0000 g | PACK | Freq: Every day | ORAL | Status: DC | PRN
  Administered 2024-01-14: 17 g via ORAL
  Filled 2024-01-12: qty 1

## 2024-01-12 MED ORDER — SODIUM CHLORIDE 0.9% FLUSH
3.0000 mL | Freq: Two times a day (BID) | INTRAVENOUS | Status: DC
  Administered 2024-01-12 – 2024-01-15 (×7): 3 mL via INTRAVENOUS

## 2024-01-12 MED ORDER — DIPHENHYDRAMINE HCL 50 MG/ML IJ SOLN: 12.5000 mg | Freq: Four times a day (QID) | INTRAMUSCULAR | Status: DC | PRN

## 2024-01-12 MED ORDER — MIDAZOLAM HCL 2 MG/2ML IJ SOLN
INTRAMUSCULAR | Status: AC | PRN
  Administered 2024-01-12 (×4): 1 mg via INTRAVENOUS

## 2024-01-12 MED ORDER — ROCURONIUM BROMIDE 10 MG/ML (PF) SYRINGE
PREFILLED_SYRINGE | INTRAVENOUS | Status: DC | PRN
  Administered 2024-01-12: 60 mg via INTRAVENOUS
  Administered 2024-01-12: 10 mg via INTRAVENOUS

## 2024-01-12 MED ORDER — SUGAMMADEX SODIUM 200 MG/2ML IV SOLN
INTRAVENOUS | Status: DC | PRN
  Administered 2024-01-12: 200 mg via INTRAVENOUS

## 2024-01-12 MED ORDER — CHLORHEXIDINE GLUCONATE 0.12 % MT SOLN
15.0000 mL | Freq: Once | OROMUCOSAL | Status: AC
  Administered 2024-01-12: 15 mL via OROMUCOSAL

## 2024-01-12 MED ORDER — VALACYCLOVIR HCL 500 MG PO TABS
1000.0000 mg | ORAL_TABLET | Freq: Three times a day (TID) | ORAL | Status: DC
  Administered 2024-01-12 – 2024-01-15 (×10): 1000 mg via ORAL
  Filled 2024-01-12 (×10): qty 2

## 2024-01-12 MED ORDER — SODIUM CHLORIDE 0.9 % IV SOLN
2.0000 g | Freq: Once | INTRAVENOUS | Status: AC
  Administered 2024-01-12: 2 g via INTRAVENOUS

## 2024-01-12 MED ORDER — DIPHENHYDRAMINE HCL 12.5 MG/5ML PO ELIX: 12.5000 mg | ORAL_SOLUTION | Freq: Four times a day (QID) | ORAL | Status: DC | PRN

## 2024-01-12 MED ORDER — DEXAMETHASONE SODIUM PHOSPHATE 10 MG/ML IJ SOLN
INTRAMUSCULAR | Status: AC
  Filled 2024-01-12: qty 1

## 2024-01-12 MED ORDER — SODIUM CHLORIDE 0.9 % IR SOLN
Status: DC | PRN
  Administered 2024-01-12 (×14): 3000 mL

## 2024-01-12 MED ORDER — FENTANYL CITRATE (PF) 100 MCG/2ML IJ SOLN
INTRAMUSCULAR | Status: DC | PRN
  Administered 2024-01-12: 25 ug via INTRAVENOUS
  Administered 2024-01-12: 50 ug via INTRAVENOUS
  Administered 2024-01-12: 25 ug via INTRAVENOUS
  Administered 2024-01-12 (×2): 50 ug via INTRAVENOUS

## 2024-01-12 SURGICAL SUPPLY — 64 items
BAG COUNTER SPONGE SURGICOUNT (BAG) IMPLANT
BAG URINE DRAIN 2000ML AR STRL (UROLOGICAL SUPPLIES) ×1 IMPLANT
BAG URO CATCHER STRL LF (MISCELLANEOUS) ×1 IMPLANT
BASKET LASER NITINOL 1.9FR (BASKET) IMPLANT
BASKET ZERO TIP NITINOL 2.4FR (BASKET) IMPLANT
BENZOIN TINCTURE PRP APPL 2/3 (GAUZE/BANDAGES/DRESSINGS) ×1 IMPLANT
BLADE SURG 15 STRL LF DISP TIS (BLADE) ×1 IMPLANT
CATH FOLEY 2W COUNCIL 20FR 5CC (CATHETERS) IMPLANT
CATH ROBINSON RED A/P 20FR (CATHETERS) IMPLANT
CATH ULTRATHANE 14FR (CATHETERS) IMPLANT
CATH URETERAL DUAL LUMEN 10F (MISCELLANEOUS) IMPLANT
CATH URETL OPEN 5X70 (CATHETERS) ×1 IMPLANT
CATH X-FORCE N30 NEPHROSTOMY (TUBING) IMPLANT
CHLORAPREP W/TINT 26 (MISCELLANEOUS) IMPLANT
CLOTH BEACON ORANGE TIMEOUT ST (SAFETY) ×1 IMPLANT
COVER BACK TABLE 60X90IN (DRAPES) ×1 IMPLANT
DERMABOND ADVANCED .7 DNX12 (GAUZE/BANDAGES/DRESSINGS) IMPLANT
DRAPE C-ARM 42X120 X-RAY (DRAPES) ×1 IMPLANT
DRAPE LINGEMAN PERC (DRAPES) ×1 IMPLANT
DRAPE SHEET LG 3/4 BI-LAMINATE (DRAPES) IMPLANT
DRAPE SURG IRRIG POUCH 19X23 (DRAPES) ×1 IMPLANT
DRSG TEGADERM 2-3/8X2-3/4 SM (GAUZE/BANDAGES/DRESSINGS) IMPLANT
DRSG TEGADERM 8X12 (GAUZE/BANDAGES/DRESSINGS) ×2 IMPLANT
FIBER LASER MOSES 200 DFL (Laser) IMPLANT
GAUZE PAD ABD 8X10 STRL (GAUZE/BANDAGES/DRESSINGS) ×2 IMPLANT
GAUZE SPONGE 4X4 12PLY STRL (GAUZE/BANDAGES/DRESSINGS) IMPLANT
GLOVE BIOGEL M 7.0 STRL (GLOVE) ×1 IMPLANT
GLOVE SURG SS PI 7.5 STRL IVOR (GLOVE) IMPLANT
GOWN STRL REUS W/ TWL XL LVL3 (GOWN DISPOSABLE) ×1 IMPLANT
GUIDEWIRE ANG ZIPWIRE 038X150 (WIRE) ×1 IMPLANT
GUIDEWIRE STR DUAL SENSOR (WIRE) ×2 IMPLANT
GUIDEWIRE SUPER STIFF (WIRE) IMPLANT
GUIDEWIRE ZIPWRE .038 STRAIGHT (WIRE) IMPLANT
HLDR NDL AMPLATZ W/INSERTS (MISCELLANEOUS) IMPLANT
HOLDER NEEDLE AMPLATZ W/INSERT (MISCELLANEOUS) ×1 IMPLANT
IV SET EXTENSION CATH 6 NF (IV SETS) IMPLANT
KIT BASIN OR (CUSTOM PROCEDURE TRAY) ×1 IMPLANT
KIT PROBE 340X3.4XDISP GRN (MISCELLANEOUS) IMPLANT
KIT PROBE TRILOGY 3.9X350 (MISCELLANEOUS) IMPLANT
KIT TURNOVER KIT A (KITS) IMPLANT
LEGGING LITHOTOMY PAIR STRL (DRAPES) IMPLANT
MANIFOLD NEPTUNE II (INSTRUMENTS) ×1 IMPLANT
NDL TROCAR 18X15 ECHO (NEEDLE) IMPLANT
NEEDLE TROCAR 18X15 ECHO (NEEDLE) ×1 IMPLANT
NS IRRIG 1000ML POUR BTL (IV SOLUTION) ×1 IMPLANT
PACK CYSTO (CUSTOM PROCEDURE TRAY) ×1 IMPLANT
PAD PREP 24X48 CUFFED NSTRL (MISCELLANEOUS) ×1 IMPLANT
SHEATH DILATOR SET 8/10 (MISCELLANEOUS) IMPLANT
SHEATH NAVIGATOR HD 12/14X46 (SHEATH) IMPLANT
SHEATH PEELAWAY SET 9 (SHEATH) IMPLANT
SPONGE T-LAP 4X18 ~~LOC~~+RFID (SPONGE) ×1 IMPLANT
SURGIFLO W/THROMBIN 8M KIT (HEMOSTASIS) IMPLANT
SUT MNCRL AB 4-0 PS2 18 (SUTURE) IMPLANT
SUT SILK 2 0 30 PSL (SUTURE) IMPLANT
SUT VIC AB 0 CT2 27 (SUTURE) IMPLANT
SUT VIC AB 4-0 PS2 18 (SUTURE) IMPLANT
SYR 10ML LL (SYRINGE) ×1 IMPLANT
SYR 20ML LL LF (SYRINGE) ×1 IMPLANT
SYR 50ML LL SCALE MARK (SYRINGE) IMPLANT
TRACTIP FLEXIVA PULS ID 200XHI (Laser) IMPLANT
TRAY FOL W/BAG SLVR 16FR STRL (SET/KITS/TRAYS/PACK) IMPLANT
TUBE CONNECTING VINYL 14FR 30C (TUBING) IMPLANT
TUBING CONNECTING 10 (TUBING) ×1 IMPLANT
TUBING UROLOGY SET (TUBING) ×1 IMPLANT

## 2024-01-12 NOTE — OR Nursing (Signed)
 Dr. Freddi Jaeger took kidney stone with him.

## 2024-01-12 NOTE — Anesthesia Postprocedure Evaluation (Addendum)
 Anesthesia Post Note  Patient: Helen Rice  Procedure(s) Performed: IR URETERAL STENT RIGHT NEW ACCESS W/O SEP NEPHROSTOMY CATH NEPHROLITHOTOMY PERCUTANEOUS SECOND LOOK (Right) CYSTOSCOPY/URETEROSCOPY/HOLMIUM LASER/STENT PLACEMENT (Right)     Patient location during evaluation: PACU Anesthesia Type: General Level of consciousness: awake and alert, patient cooperative and oriented Pain management: pain level controlled Vital Signs Assessment: post-procedure vital signs reviewed and stable Respiratory status: spontaneous breathing, nonlabored ventilation and respiratory function stable Cardiovascular status: blood pressure returned to baseline and stable Postop Assessment: no apparent nausea or vomiting Anesthetic complications: no Comments: Pt difficult intubation, in past, easy VideoGlide intubation today   Encounter Notable Events  Notable Event Outcome Phase Comment  Difficult to intubate - expected  Intraprocedure Filed from anesthesia note documentation.    Last Vitals:  Vitals:   01/12/24 1545 01/12/24 1600  BP: (!) 163/94 133/72  Pulse: 95 99  Resp: 15 15  Temp:    SpO2: 99% 95%    Last Pain:  Vitals:   01/12/24 1600  TempSrc:   PainSc: 0-No pain                 Alexandria Shiflett,E. Tobby Fawcett

## 2024-01-12 NOTE — Op Note (Signed)
 Operative Note  Preoperative diagnosis:  1.  Right renal stones  Postoperative diagnosis: 1.  Right renal stones  Procedure(s): 1. Right percutaneous nephrolithotomy (greater than 2 cm) 2. Creation of new right renal percutaneous tract under fluoroscopy 3. Cystoscopy with retrograde ureteroscopy 4. Right antegrade nephrostogram 5. Fluoroscopy time less than 1 hour with interpretation 6. Right ureteral stent placement 7. Right percutaneous nephrostomy tube placement  Surgeon: Doy Gene, MD  Anesthesia:  General  Complications:  None  EBL:  Minimal  Specimens: 1. Stones for stone analysis  Drains/Catheters: 1.   A 16-French Foley 2.   Right 14-French nephrostomy tube 3. Right 5 French open ended ureteral stent  Intraoperative findings:   Staghorn stone occupying entirety of collecting system over 5cm Previously obtain IR fluoro guided access into right renal pelvis was removed as this initially missed the lower pole calyx. New access obtained in posterior right interpolar calyx. Successful treatment of 75% of stone occupying right lower and interpolar calyces.  Indication:  Helen Rice is a 48 y.o. female with a history of urolithiasis. Their stone burden included a large staghorn right renal stone. After a thorough discussion of the risks, benefits, and alternatives of the surgery, pt agreed to proceed.  In the morning of surgery, pt went down to Interventional Radiology to get an access placed and nephroscopy films were reviewed prior to surgery today. They had access 4-French nephroureteral catheter going all the way down to the bladder.  Description of procedure: After informed consent was obtained from the patient, the patient was identified, the patient was brought to the operating room and placed in supine position.   General anesthesia was administered as well as perioperative IV antibiotics with 5mg /kg of gentamicin.  At the beginning of the case, a time-out was  performed to properly identify the patient, the surgery to be performed, and the surgical site. Sequential compression devices were applied to lower extremities at the beginning of the case for DVT prophylaxis.    The patient was then placed into a prone position onto gel rolls, making sure that all pressure points were properly padded.  The patient's right flank and genitalia were then prepped and draped in sterile fashion.  A superstiff wire was passed through the 4-French open-ended catheter down to the bladder, which we could see under fluoroscopy. We made an incision around this wire. We then dilated the tract using a dual lumen.  We inserted a cystoscope into the bladder and passed a sensor wire up to the right renal pelvis.  Over this wire, we passed a ureteroscope into the collecting system.  There is apparent large staghorn stone filling the entirety of the collecting system.  It was apparent that the previously obtained access was puncturing through the renal pelvis just proximal to the UPJ.  Thus, we did not use this access.  We then obtained additional access into a right posterior interpolar calyx using bull's-eye technique.  We were to visualize the needle entering into the interpolar calyx with the flexible ureteroscope.  Through this wire we then passed a Super Stiff wire and this was grabbed with the basket with the ureteroscope and brought out through the urethra.  Hemostat was then attached.  Over this new wire, we passed a dual-lumen catheter performed antegrade nephrostogram revealing no extravasation of contrast with filling of the entirety of the collecting system adjacent to the stone.  We passed a separate 0.038 sensor wire and this was passed down to the level of  the bladder.  The Bard X force balloon was then passed over the superstiff wire until the radiopaque tip was well into the interpolar calyx.  We dilated this balloon to 18 cm of water pressure.  We advanced the sheath over  the balloon.  We deflated the balloon, leaving the wire in place.  Of note, there are some difficulty gaining access into the kidney given the large staghorn stone with no space available within the collecting system.  Ultimately, we were able to navigate the rigid nephroscope into the collecting system identified a large staghorn stone.  The rigid nephroscope and the Trilogy were inserted into the kidney. The stone burden was encountered. The Trilogy was used to fragement and suction out the stone.  Over the next hour, the right lower pole interpolar stone burden was treated successfully.  We treated approximately 75% of total stone burden.  At this point, I was unable to provide any further torque to access the upper pole stone burden given location of the stone and access into the interpolar calyx.  Next, a 14 French nephrostomy tube was coiled into the collecting system and secured the patient's skin with a 2-0 silk suture.  Adjacent to this nephrostomy tube, a Kumpe catheter was advanced over the previously placed sensor wire to the level of the bladder.  This was also secured to the patient's skin with 2-0 silk suture.  The access sheath was removed.  The previously made incision around the lower pole access was closed using 4-0 Vicryl and 4 Monocryl sutures.  Next, 4 x 4's and ABD and Tegaderm were then applied around the nephrostomy tube, catheter. Several large OpSite dressings were placed over this.    At this point, the procedure was completed. He was then transferred to the supine position and recovery room in stable condition. The patient tolerated the procedure well.  There were no immediate complications.  Plan:  Patient will be observed overnight and plan of takeback for second look percutaneous nephrolithotomy scheduled on Monday afternoon.  Matt R. Undrea Shipes MD [facility]  Pager: [phone]

## 2024-01-12 NOTE — H&P (Signed)
 Office Visit Report     11/13/2023   --------------------------------------------------------------------------------   Basilia Lima A. Seat  MRN: [id number]  DOB: October 08, 1975, 48 year old Female  SSN:    PRIMARY CARE:  Ashly M. Bonnell Butcher, DO  PRIMARY CARE FAX:  [phone]  REFERRING:  Malcolm Scrivener. Dahlstedt, MD  PROVIDER:  Doy Gene, M.D.  LOCATION:  [facility] [phone]     --------------------------------------------------------------------------------   CC/HPI: Breia Ocampo is a 48 year old female who is seen in consultation today for staghorn right renal stone.   1. Staghorn right renal stone:  - She is found to have gross hematuria and CT hematuria protocol 10/08/2023 revealed large staghorn stone occupying the entirety of the right collecting system and renal pelvis measuring 5.5 x 3.4 cm in caliber. There is no hydronephrosis. She had no left sided renal stones. She was seen by Dr. Joie Narrow and had cystoscopy that was unremarkable.  - She denies significant flank pain. Her hematuria has resolved. She states that her urine is clear. She has been treated with a course of antibiotics recently. She denies prior history of stones.   She has a past medical history of hyperlipidemia, depression. She denies cardiac or pulmonary history. She denies past surgical history. She denies taking anticoagulation.     ALLERGIES: Latex Sulfa    MEDICATIONS: Estrace  0.1 MG/GM Cream  Omeprazole   buPROPion  HCl ER (SR) 150 MG Tablet Extended Release 12 Hour  Desvenlafaxine  ER  hydrOXYzine  HCl  Vitamin D  (Cholecalciferol )     GU PSH: None   NON-GU PSH: None   GU PMH: None   NON-GU PMH: Anxiety Arthritis Depression GERD Hypercholesterolemia    FAMILY HISTORY: None   SOCIAL HISTORY: Marital Status: Married Current Smoking Status: Patient smokes.   Tobacco Use Assessment Completed: Used Tobacco in last 30 days? Drinks 3 drinks per week.  Drinks 3 caffeinated  drinks per day.    REVIEW OF SYSTEMS:    GU Review Female:   Patient denies frequent urination, hard to postpone urination, burning /pain with urination, get up at night to urinate, leakage of urine, stream starts and stops, trouble starting your stream, have to strain to urinate, and being pregnant.  Gastrointestinal (Upper):   Patient reports indigestion/ heartburn. Patient denies nausea and vomiting.  Gastrointestinal (Lower):   Patient reports diarrhea and constipation.   Constitutional:   Patient reports night sweats and fatigue. Patient denies fever and weight loss.  Skin:   Patient denies skin rash/ lesion and itching.  Eyes:   Patient denies double vision and blurred vision.  Ears/ Nose/ Throat:   Patient reports sinus problems. Patient denies sore throat.  Hematologic/Lymphatic:   Patient reports easy bruising. Patient denies swollen glands.  Cardiovascular:   Patient denies leg swelling and chest pains.  Respiratory:   Patient denies cough and shortness of breath.  Endocrine:   Patient denies excessive thirst.  Musculoskeletal:   Patient reports back pain and joint pain.   Neurological:   Patient denies headaches and dizziness.  Psychologic:   Patient reports depression and anxiety.    VITAL SIGNS:      11/13/2023 08:18 AM  Weight 130 lb / 58.97 kg  Height 62 in / 157.48 cm  BP 108/76 mmHg  Pulse 87 /min  BMI 23.8 kg/m   MULTI-SYSTEM PHYSICAL EXAMINATION:    Constitutional: Well-nourished. No physical deformities. Normally developed. Good grooming.  Respiratory: No labored breathing, no use of accessory muscles.   Cardiovascular: Normal temperature, normal extremity pulses,  no swelling, no varicosities.  Gastrointestinal: No mass, no tenderness, no rigidity, non obese abdomen.     Complexity of Data:  Source Of History:  Patient, Medical Record Summary  Records Review:   Previous Doctor Records, Previous Patient Records  Urine Test Review:   Urinalysis  X-Ray Review:  C.T. Abdomen/Pelvis: Reviewed Films. Reviewed Report. Discussed With Patient.     PROCEDURES:          Visit Complexity - G2211          Urinalysis w/Scope - 81001 Dipstick Dipstick Cont'd Micro  Color: Yellow Bilirubin: Neg WBC/hpf: 6 - 10/hpf  Appearance: Cloudy Ketones: Neg RBC/hpf: 3 - 10/hpf  Specific Gravity: 1.010 Blood: 1+ Bacteria: Few (10-25/hpf)  pH: 6.0 Protein: Trace Cystals: NS (Not Seen)  Glucose: Neg Urobilinogen: 0.2 Casts: NS (Not Seen)    Nitrites: Neg Trichomonas: Not Present    Leukocyte Esterase: 2+ Mucous: Present      Epithelial Cells: 0 - 5/hpf      Yeast: NS (Not Seen)      Sperm: Not Present    Notes:      ASSESSMENT:      ICD-10 Details  1 GU:   Renal calculus - N20.0    PLAN:           Orders Labs Urine Culture          Schedule Return Visit/Planned Activity: Next Available Appointment - Schedule Surgery          Document Letter(s):  Created for Patient: Clinical Summary         Notes:    1. Staghorn right renal stone:  - We discussed that treatment for the stone would require PCNL. Discussed that this is most likely require at least 2 surgeries. Will arrange for IR guided access prior. Discussed risk and benefits including risk of bleeding requiring transfusion or embolization, infection, persistence of stones, pneumothorax, anesthesia risk including death.   We discussed the options for management of kidney stones, including observation, ESWL, ureteroscopy with laser lithotripsy, and PCNL. The risks and benefits of each option were discussed.  For observation I described the risks which include but are not limited to silent renal damage, life-threatening infection, need for emergent surgery, failure to pass stone, and pain.   Ureteroscopy: risks and benefits of ureteroscopy were outlined, including infection, bleeding, pain, temporary ureteral stent and associated stent bother, ureteral injury, ureteral stricture, need for ancillary  treatments, and global anesthesia risks including but not limited to CVA, MI, DVT, PE, pneumonia, and death.   PCNL: risks and benefits of PCNL were outlined including infection, bleeding, blood transfusion, pain, pneumothorax, bowel injury, persistent urine leak, positioning injury, inability to clear stone burden, renal laceration, arterial venous fistula or malformation, need for ancillary treatments, and global anesthesia risks including but not limited to CVA, MI, DVT, PE, pneumonia, and death.   Surgery letter submitted. Will send urine for culture today.   Urology Preoperative H&P   Chief Complaint: Right staghorn renal stone  History of Present Illness: LANGSTON SUMMERFIELD is a 48 y.o. female with a right staghorn renal stone here today for first stage right PCNL. Pt had access obtained with IR. Unfortunately, access was difficult with extravasation present but ultimately a catheter was advanced from the lower pole to the bladder. Discussed options with patient and she elects to proceed with attempt at first stage PCNL.   Past Medical History:  Diagnosis Date   Anemia    hx of  Anxiety    Depression    Difficult intubation    GERD (gastroesophageal reflux disease) 05/30/2017   History of kidney stones    Lyme disease    being seen by [facility]   Pneumonia    Psoriasis     Past Surgical History:  Procedure Laterality Date   ABDOMINAL HYSTERECTOMY     APPENDECTOMY     CESAREAN SECTION     CHOLECYSTECTOMY     COLONOSCOPY     upper/lower   GIVENS CAPSULE STUDY N/A 06/21/2017   Procedure: GIVENS CAPSULE STUDY;  Surgeon: Ruby Corporal, MD;  Location: [facility];  Service: Endoscopy;  Laterality: N/A;    Allergies:  Allergies  Allergen Reactions   Influenza Vaccines     Sweating and chills    Elemental Sulfur Rash   Latex Rash    Family History  Problem Relation Age of Onset   Cancer Father    Psoriasis Father    Autoimmune disease Father     Breast cancer Sister    Heart attack Brother 72       had a second MI which required quad bypass 46yr   Heart attack Brother 31       MI w/ stent   Thyroid  disease Maternal Aunt    Heart disease Maternal Grandmother    Heart disease Maternal Grandfather    Ovarian cancer Paternal Grandmother 60   Ovarian cancer Paternal Grandfather     Social History:  reports that she quit smoking about 9 years ago. Her smoking use included cigarettes. She started smoking about 24 years ago. She has a 22.5 pack-year smoking history. She has never used smokeless tobacco. She reports current alcohol  use. She reports that she does not use drugs.  ROS: A complete review of systems was performed.  All systems are negative except for pertinent findings as noted.  Physical Exam:  Vital signs in last 24 hours: Temp:  [98 F (36.7 C)] 98 F (36.7 C) (06/06 0746) Pulse Rate:  [79-104] 101 (06/06 1125) Resp:  [11-18] 14 (06/06 1125) BP: (129-165)/(80-102) 158/102 (06/06 1125) SpO2:  [61 %-100 %] 94 % (06/06 1125) Weight:  [60 kg] 60 kg (06/06 0823) Constitutional:  Alert and oriented, No acute distress Cardiovascular: Regular rate and rhythm Respiratory: Normal respiratory effort, Lungs clear bilaterally GI: Abdomen is soft, nontender, nondistended, no abdominal masses GU: No CVA tenderness Lymphatic: No lymphadenopathy Neurologic: Grossly intact, no focal deficits Psychiatric: Normal mood and affect  Laboratory Data:  Recent Labs    01/12/24 0800  WBC 4.3  HGB 13.3  HCT 40.6  PLT 267    Recent Labs    01/12/24 0800  NA 138  K 4.0  CL 107  GLUCOSE 85  BUN 13  CALCIUM 9.2  CREATININE 0.77     Results for orders placed or performed during the hospital encounter of 01/12/24 (from the past 24 hours)  CBC with Differential/Platelet     Status: None   Collection Time: 01/12/24  8:00 AM  Result Value Ref Range   WBC 4.3 4.0 - 10.5 K/uL   RBC 4.42 3.87 - 5.11 MIL/uL   Hemoglobin 13.3  12.0 - 15.0 g/dL   HCT 16.1 09.6 - 04.5 %   MCV 91.9 80.0 - 100.0 fL   MCH 30.1 26.0 - 34.0 pg   MCHC 32.8 30.0 - 36.0 g/dL   RDW 40.9 81.1 - 91.4 %   Platelets 267 150 - 400 K/uL  nRBC 0.0 0.0 - 0.2 %   Neutrophils Relative % 62 %   Neutro Abs 2.7 1.7 - 7.7 K/uL   Lymphocytes Relative 29 %   Lymphs Abs 1.3 0.7 - 4.0 K/uL   Monocytes Relative 7 %   Monocytes Absolute 0.3 0.1 - 1.0 K/uL   Eosinophils Relative 1 %   Eosinophils Absolute 0.0 0.0 - 0.5 K/uL   Basophils Relative 0 %   Basophils Absolute 0.0 0.0 - 0.1 K/uL   Immature Granulocytes 1 %   Abs Immature Granulocytes 0.02 0.00 - 0.07 K/uL  Protime-INR     Status: None   Collection Time: 01/12/24  8:00 AM  Result Value Ref Range   Prothrombin Time 12.6 11.4 - 15.2 seconds   INR 0.9 0.8 - 1.2  Basic metabolic panel     Status: Abnormal   Collection Time: 01/12/24  8:00 AM  Result Value Ref Range   Sodium 138 135 - 145 mmol/L   Potassium 4.0 3.5 - 5.1 mmol/L   Chloride 107 98 - 111 mmol/L   CO2 20 (L) 22 - 32 mmol/L   Glucose, Bld 85 70 - 99 mg/dL   BUN 13 6 - 20 mg/dL   Creatinine, Ser 1.61 0.44 - 1.00 mg/dL   Calcium 9.2 8.9 - 09.6 mg/dL   GFR, Estimated >04 >54 mL/min   Anion gap 11 5 - 15   No results found for this or any previous visit (from the past 240 hours).  Renal Function: Recent Labs    01/12/24 0800  CREATININE 0.77   Estimated Creatinine Clearance: 67.3 mL/min (by C-G formula based on SCr of 0.77 mg/dL).  Radiologic Imaging: No results found.  I independently reviewed the above imaging studies.  Assessment and Plan Otie A Collison is a 48 y.o. female with a staghorn right renal stone here for right PCNL.  -The risks, benefits and alternatives of cystoscopy with right ureteroscopy, percutaneous nephrolithotomy, JJ stent placement was discussed with the patient.  Risks include, but are not limited to: bleeding, urinary tract infection, ureteral injury, ureteral stricture disease, chronic pain,  urinary symptoms, bladder injury, stent migration, the need for nephrostomy tube placement, MI, CVA, DVT, PE and the inherent risks with general anesthesia.  The patient voices understanding and wishes to proceed.    Matt R. Noreen Mackintosh MD 01/12/2024, 11:27 AM  [facility] Pager: [phone]

## 2024-01-12 NOTE — Transfer of Care (Signed)
 Immediate Anesthesia Transfer of Care Note  Patient: Contessa A Tietje  Procedure(s) Performed: IR URETERAL STENT RIGHT NEW ACCESS W/O SEP NEPHROSTOMY CATH NEPHROLITHOTOMY PERCUTANEOUS SECOND LOOK (Right) CYSTOSCOPY/URETEROSCOPY/HOLMIUM LASER/STENT PLACEMENT (Right)  Patient Location: PACU  Anesthesia Type:General  Level of Consciousness: sedated  Airway & Oxygen Therapy: Patient Spontanous Breathing and Patient connected to face mask oxygen  Post-op Assessment: Report given to RN and Post -op Vital signs reviewed and stable  Post vital signs: Reviewed and stable  Last Vitals:  Vitals Value Taken Time  BP 145/88 01/12/24 1521  Temp    Pulse 82 01/12/24 1527  Resp 10 01/12/24 1527  SpO2 100 % 01/12/24 1527  Vitals shown include unfiled device data.  Last Pain:  Vitals:   01/12/24 0823  TempSrc:   PainSc: 0-No pain         Complications:  Encounter Notable Events  Notable Event Outcome Phase Comment  Difficult to intubate - expected  Intraprocedure Filed from anesthesia note documentation.

## 2024-01-12 NOTE — Procedures (Signed)
 Interventional Radiology Procedure:   Indications: Right renal staghorn calculus  Procedure: Placement of right nephroureteral catheter  Findings: Access obtained from a lower pole calyx.  It was very difficult to advance a wire and catheter into ureter.  Small amount of contrast extravasation at UPJ and proximal ureter.  Eventually, catheter and wire advanced down ureter and into bladder.  Complications: None     EBL: Minimal  Plan: To OR for nephrolithotomy procedure with Urology.  Harnoor Reta R. Julietta Ogren, MD  Pager: [phone]

## 2024-01-12 NOTE — Anesthesia Procedure Notes (Addendum)
 Procedure Name: Intubation Date/Time: 01/12/2024 12:17 PM  Performed by: Ezzie Holstein, CRNAPre-anesthesia Checklist: Patient identified, Emergency Drugs available, Suction available and Patient being monitored Patient Re-evaluated:Patient Re-evaluated prior to induction Oxygen Delivery Method: Circle System Utilized Preoxygenation: Pre-oxygenation with 100% oxygen Induction Type: IV induction Ventilation: Mask ventilation without difficulty Laryngoscope Size: Glidescope and 3 Grade View: Grade I Tube type: Parker flex tip Tube size: 7.0 mm Number of attempts: 1 Airway Equipment and Method: Video-laryngoscopy and Rigid stylet Placement Confirmation: ETT inserted through vocal cords under direct vision, positive ETCO2 and breath sounds checked- equal and bilateral Secured at: 22 cm Tube secured with: Tape Dental Injury: Teeth and Oropharynx as per pre-operative assessment  Difficulty Due To: Difficulty was anticipated, Difficult Airway- due to anterior larynx and Difficult Airway- due to limited oral opening Future Recommendations: Recommend- induction with short-acting agent, and alternative techniques readily available Comments: Pt with history of difficult intubation. GlydeScope as immediate choice for intubation due to patient history. Grade 1 view, gentle 360 circular motion to pass 7.0 Parker ETT.

## 2024-01-12 NOTE — Sedation Documentation (Addendum)
 RN Shanara Schnieders pulled 4mg  Versed and 200mcg Fentanyl in IR room pysix. Pt. Received 4mg  Versed and 200mcg Fentanyl throughout the procedure.

## 2024-01-12 NOTE — Sedation Documentation (Addendum)
 Pt. Was put on a NRB at 15L after desatting. Pt. Opened her eyes and responded to verbal response. Due to her being on her stomach she was having a hard time breathing properly. After the placement of the NRB pt. Returned to a SPO2 sat of 100%. Pt. Was slowly turned back down to 2L before returning to short stay. Pt. Is stable and alert. Pt. May want to get testing for sleep apnea due to her holding her breath while sleepy.

## 2024-01-13 ENCOUNTER — Encounter (HOSPITAL_COMMUNITY): Payer: Self-pay | Admitting: Urology

## 2024-01-13 LAB — CBC
HCT: 36 % (ref 36.0–46.0)
Hemoglobin: 11.7 g/dL — ABNORMAL LOW (ref 12.0–15.0)
MCH: 30.2 pg (ref 26.0–34.0)
MCHC: 32.5 g/dL (ref 30.0–36.0)
MCV: 93 fL (ref 80.0–100.0)
Platelets: 286 10*3/uL (ref 150–400)
RBC: 3.87 MIL/uL (ref 3.87–5.11)
RDW: 13.5 % (ref 11.5–15.5)
WBC: 11.1 10*3/uL — ABNORMAL HIGH (ref 4.0–10.5)
nRBC: 0 % (ref 0.0–0.2)

## 2024-01-13 LAB — BASIC METABOLIC PANEL WITH GFR
Anion gap: 8 (ref 5–15)
BUN: 12 mg/dL (ref 6–20)
CO2: 20 mmol/L — ABNORMAL LOW (ref 22–32)
Calcium: 8.4 mg/dL — ABNORMAL LOW (ref 8.9–10.3)
Chloride: 109 mmol/L (ref 98–111)
Creatinine, Ser: 0.98 mg/dL (ref 0.44–1.00)
GFR, Estimated: >60 mL/min (ref >60)
Glucose, Bld: 126 mg/dL — ABNORMAL HIGH (ref 70–99)
Potassium: 4 mmol/L (ref 3.5–5.1)
Sodium: 137 mmol/L (ref 135–145)

## 2024-01-13 MED ORDER — BISACODYL 5 MG PO TBEC: 5.0000 mg | DELAYED_RELEASE_TABLET | Freq: Every day | ORAL | Status: DC | PRN

## 2024-01-13 NOTE — Progress Notes (Signed)
 Upon assessment, right nephrostomy dressing was found to be saturated with red/pink/serosanguinous liquid. Urology MD notified at bedside and gave order to change dressing. Dressing changed with split gauze and topped with ABDs and Tegaderm. Date and time applied.

## 2024-01-13 NOTE — Progress Notes (Signed)
 1 Day Post-Op Subjective: No acute events overnight. +OOB. Tolerating diet  Objective: Vital signs in last 24 hours: Temp:  [97.5 F (36.4 C)-99.5 F (37.5 C)] 99.5 F (37.5 C) (06/07 0458) Pulse Rate:  [79-104] 93 (06/07 0458) Resp:  [11-20] 18 (06/07 0458) BP: (104-165)/(55-102) 104/55 (06/07 0458) SpO2:  [61 %-100 %] 95 % (06/07 0458) Weight:  [60 kg] 60 kg (06/06 0823)  Intake/Output from previous day: 06/06 0701 - 06/07 0700 In: 1307.5 [P.O.:700; I.V.:500; IV Piggyback:107.5] Out: 2205 [Urine:2200; Blood:5] Intake/Output this shift: No intake/output data recorded.  Physical Exam:  General: Alert and oriented CV: RRR Lungs: Clear Ext: NT, No erythema Neph tube and foley draining pink/brown urine  Lab Results: Recent Labs    01/12/24 0800 01/12/24 1624 01/13/24 0541  HGB 13.3 14.6 11.7*  HCT 40.6 45.3 36.0   BMET Recent Labs    01/12/24 1624 01/13/24 0541  NA 141 137  K 4.3 4.0  CL 111 109  CO2 19* 20*  GLUCOSE 119* 126*  BUN 11 12  CREATININE 0.79 0.98  CALCIUM 8.2* 8.4*     Studies/Results: IR URETERAL STENT RIGHT NEW ACCESS W/O SEP NEPHROSTOMY CATH Result Date: 01/12/2024 INDICATION: 48 year old with right renal staghorn calculus. Percutaneous access needed for percutaneous nephrolithotomy procedure. EXAM: PLACEMENT OF RIGHT NEPHROURETERAL CATHETER USING ULTRASOUND AND FLUOROSCOPIC GUIDANCE COMPARISON:  CT abdomen and pelvis 09/21/2023 MEDICATIONS: Rocephin  2 g; The antibiotic was administered in an appropriate time frame prior to skin puncture. ANESTHESIA/SEDATION: Moderate (conscious) sedation was employed during this procedure. A total of Versed  4 mg and Fentanyl  100 mcg was administered intravenously by the radiology nurse. Total intra-service moderate Sedation Time: 88 minutes. The patient's level of consciousness and vital signs were monitored continuously by radiology nursing throughout the procedure under my direct supervision. CONTRAST:  30 mL  Omnipaque  300-administered into the collecting system(s) FLUOROSCOPY: Radiation Exposure Index (as provided by the fluoroscopic device): 68 mGy Kerma COMPLICATIONS: None immediate. PROCEDURE: Informed written consent was obtained from the patient after a thorough discussion of the procedural risks, benefits and alternatives. All questions were addressed. Maximal Sterile Barrier Technique was utilized including caps, mask, sterile gowns, sterile gloves, sterile drape, hand hygiene and skin antiseptic. A timeout was performed prior to the initiation of the procedure. Patient was placed prone. Right flank was prepped and draped in sterile fashion. Ultrasound was used to identify the right kidney. Skin was anesthetized with 1% lidocaine . Using ultrasound guidance, 21 gauge needle was directed towards the right kidney lower pole calyx containing stone. It was difficult to accurately place the needle using ultrasound guidance. Therefore, a 21 gauge needle was directed onto the lower pole stone using fluoroscopic guidance. Contrast injection confirmed placement in the collecting system. 0.018 wire was advanced into the collecting system. Unable to advance a transitional dilator set over the wire. This access was removed. 18 gauge needle was directed onto the lower pole stone using fluoroscopic guidance. Again, contrast injection confirmed placement in the renal collecting system. Glidewire was successfully advanced into the renal collecting system. A 4 French glide catheter was advanced over the wire into the collecting system. Contrast injection confirmed placement in the renal collecting system. Initially, the catheter was preferentially going towards the upper pole. Catheter was redirected towards the renal pelvis and exchanged for 5 French Kumpe catheter. Wire was not easily advancing into the ureter. This catheter was exchanged for an angled 5 French glide catheter. While trying to advance the wire down the ureter,  there was  extravasation of contrast around the UPJ and concern for creation of a false channel. Glide catheter was exchanged for a regular Kumpe catheter over an Amplatz wire. Stiff Glidewire was advanced into the renal collecting system and buckled into the ureter. The wire was successfully advanced down the ureter. Catheter was advanced over the wire and contrast injection confirmed placement in the ureter. Catheter was advanced to the urinary bladder, flushed with saline and capped. Catheter was sutured to skin. Dressing was placed. Fluoroscopic and ultrasound images were taken and saved for documentation. FINDINGS: Right renal staghorn calculus occupying majority of the renal collecting system. Access was obtained from a lower pole stone filled calyx. Initially, the catheter was preferentially going into the upper pole. The catheter was redirected towards the renal pelvis but it was very difficult to advance a wire down the ureter. Eventually a wire did advance down the ureter and a catheter was advanced over the wire into the bladder. Final images demonstrate that the catheter extends into the bladder but catheter was slightly inferior to the stones. The operative report after this procedure noted that the catheter was puncturing through the renal pelvis suggesting that the nephroureteral catheter was accessing the lower pole calyx but then exiting the collecting system and then re-entering the collecting system near the renal pelvis. IMPRESSION: 1. Placement of a right nephroureteral catheter using ultrasound and fluoroscopic guidance. Electronically Signed   By: Elene Griffes M.D.   On: 01/12/2024 18:06   DG Chest Port 1 View Result Date: 01/12/2024 CLINICAL DATA:  Right renal stone EXAM: PORTABLE CHEST 1 VIEW COMPARISON:  None Available. FINDINGS: Low lung volumes with bronchovascular crowding. Bibasilar patchy and linear opacities. No pleural effusion or pneumothorax. Enlarged cardiomediastinal silhouette is  likely projectional. No acute osseous abnormality. Right upper quadrant surgical clips. IMPRESSION: Low lung volumes with bronchovascular crowding. Bibasilar patchy and linear opacities, likely atelectasis. Electronically Signed   By: Limin  Xu M.D.   On: 01/12/2024 16:11   DG C-Arm 1-60 Min-No Report Result Date: 01/12/2024 Fluoroscopy was utilized by the requesting physician.  No radiographic interpretation.   DG C-Arm 1-60 Min-No Report Result Date: 01/12/2024 Fluoroscopy was utilized by the requesting physician.  No radiographic interpretation.   DG C-Arm 1-60 Min-No Report Result Date: 01/12/2024 Fluoroscopy was utilized by the requesting physician.  No radiographic interpretation.    Assessment/Plan: Helen Rice is POD 1 s/p R PCNL  --continue neph tube, foley --plan for stage 2 on Monday --NPO at [place] tomorrow night   LOS: 1 day   Julene Oaks MD 01/13/2024, 8:15 AM [facility]  Pager: [phone]

## 2024-01-13 NOTE — Progress Notes (Signed)
   01/13/24 2133  Assess: MEWS Score  Temp 98.1 F (36.7 C)  BP (!) 113/55  MAP (mmHg) 73  Pulse Rate (!) 114  Resp 18  SpO2 97 %  O2 Device Room Air  Assess: MEWS Score  MEWS Temp 0  MEWS Systolic 0  MEWS Pulse 2  MEWS RR 0  MEWS LOC 0  MEWS Score 2  MEWS Score Color Yellow  Assess: if the MEWS score is Yellow or Red  Were vital signs accurate and taken at a resting state? Yes  Does the patient meet 2 or more of the SIRS criteria? No  MEWS guidelines implemented  Yes, yellow  Treat  MEWS Interventions Considered administering scheduled or prn medications/treatments as ordered  Take Vital Signs  Increase Vital Sign Frequency  Yellow: Q2hr x1, continue Q4hrs until patient remains green for 12hrs  Escalate  MEWS: Escalate Yellow: Discuss with charge nurse and consider notifying provider and/or RRT  Notify: Charge Nurse/RN  Name of Charge Nurse/RN Notified Kristine, RN  Assess: SIRS CRITERIA  SIRS Temperature  0  SIRS Respirations  0  SIRS Pulse 1  SIRS WBC 0  SIRS Score Sum  1

## 2024-01-14 MED ORDER — VENLAFAXINE HCL ER 150 MG PO CP24: 150.0000 mg | ORAL_CAPSULE | Freq: Every day | ORAL | Status: DC

## 2024-01-14 MED ORDER — VENLAFAXINE HCL ER 75 MG PO CP24
75.0000 mg | ORAL_CAPSULE | ORAL | Status: AC
  Administered 2024-01-14: 75 mg via ORAL
  Filled 2024-01-14: qty 1

## 2024-01-14 MED ORDER — VENLAFAXINE HCL ER 150 MG PO CP24
150.0000 mg | ORAL_CAPSULE | Freq: Every day | ORAL | Status: DC
  Administered 2024-01-15: 150 mg via ORAL
  Filled 2024-01-14: qty 1

## 2024-01-14 NOTE — Progress Notes (Signed)
 2 Days Post-Op Subjective: No acute events overnight. +OOB. Tolerating diet  Objective: Vital signs in last 24 hours: Temp:  [98.1 F (36.7 C)-98.6 F (37 C)] 98.6 F (37 C) (06/08 0413) Pulse Rate:  [85-114] 102 (06/08 0413) Resp:  [18-22] 18 (06/08 0413) BP: (103-125)/(55-65) 110/59 (06/08 0413) SpO2:  [93 %-99 %] 93 % (06/08 0413)  Intake/Output from previous day: 06/07 0701 - 06/08 0700 In: 730 [P.O.:730] Out: 1450 [Urine:1450] Intake/Output this shift: No intake/output data recorded.  Physical Exam:  General: Alert and oriented CV: RRR Lungs: Clear Ext: NT, No erythema Neph tube and foley draining pink/brown urine  Lab Results: Recent Labs    01/12/24 0800 01/12/24 1624 01/13/24 0541  HGB 13.3 14.6 11.7*  HCT 40.6 45.3 36.0   BMET Recent Labs    01/12/24 1624 01/13/24 0541  NA 141 137  K 4.3 4.0  CL 111 109  CO2 19* 20*  GLUCOSE 119* 126*  BUN 11 12  CREATININE 0.79 0.98  CALCIUM 8.2* 8.4*     Studies/Results: IR URETERAL STENT RIGHT NEW ACCESS W/O SEP NEPHROSTOMY CATH Result Date: 01/12/2024 INDICATION: 48 year old with right renal staghorn calculus. Percutaneous access needed for percutaneous nephrolithotomy procedure. EXAM: PLACEMENT OF RIGHT NEPHROURETERAL CATHETER USING ULTRASOUND AND FLUOROSCOPIC GUIDANCE COMPARISON:  CT abdomen and pelvis 09/21/2023 MEDICATIONS: Rocephin  2 g; The antibiotic was administered in an appropriate time frame prior to skin puncture. ANESTHESIA/SEDATION: Moderate (conscious) sedation was employed during this procedure. A total of Versed  4 mg and Fentanyl  100 mcg was administered intravenously by the radiology nurse. Total intra-service moderate Sedation Time: 88 minutes. The patient's level of consciousness and vital signs were monitored continuously by radiology nursing throughout the procedure under my direct supervision. CONTRAST:  30 mL Omnipaque  300-administered into the collecting system(s) FLUOROSCOPY: Radiation  Exposure Index (as provided by the fluoroscopic device): 68 mGy Kerma COMPLICATIONS: None immediate. PROCEDURE: Informed written consent was obtained from the patient after a thorough discussion of the procedural risks, benefits and alternatives. All questions were addressed. Maximal Sterile Barrier Technique was utilized including caps, mask, sterile gowns, sterile gloves, sterile drape, hand hygiene and skin antiseptic. A timeout was performed prior to the initiation of the procedure. Patient was placed prone. Right flank was prepped and draped in sterile fashion. Ultrasound was used to identify the right kidney. Skin was anesthetized with 1% lidocaine . Using ultrasound guidance, 21 gauge needle was directed towards the right kidney lower pole calyx containing stone. It was difficult to accurately place the needle using ultrasound guidance. Therefore, a 21 gauge needle was directed onto the lower pole stone using fluoroscopic guidance. Contrast injection confirmed placement in the collecting system. 0.018 wire was advanced into the collecting system. Unable to advance a transitional dilator set over the wire. This access was removed. 18 gauge needle was directed onto the lower pole stone using fluoroscopic guidance. Again, contrast injection confirmed placement in the renal collecting system. Glidewire was successfully advanced into the renal collecting system. A 4 French glide catheter was advanced over the wire into the collecting system. Contrast injection confirmed placement in the renal collecting system. Initially, the catheter was preferentially going towards the upper pole. Catheter was redirected towards the renal pelvis and exchanged for 5 French Kumpe catheter. Wire was not easily advancing into the ureter. This catheter was exchanged for an angled 5 French glide catheter. While trying to advance the wire down the ureter, there was extravasation of contrast around the UPJ and concern for creation of a  false channel. Glide catheter was exchanged for a regular Kumpe catheter over an Amplatz wire. Stiff Glidewire was advanced into the renal collecting system and buckled into the ureter. The wire was successfully advanced down the ureter. Catheter was advanced over the wire and contrast injection confirmed placement in the ureter. Catheter was advanced to the urinary bladder, flushed with saline and capped. Catheter was sutured to skin. Dressing was placed. Fluoroscopic and ultrasound images were taken and saved for documentation. FINDINGS: Right renal staghorn calculus occupying majority of the renal collecting system. Access was obtained from a lower pole stone filled calyx. Initially, the catheter was preferentially going into the upper pole. The catheter was redirected towards the renal pelvis but it was very difficult to advance a wire down the ureter. Eventually a wire did advance down the ureter and a catheter was advanced over the wire into the bladder. Final images demonstrate that the catheter extends into the bladder but catheter was slightly inferior to the stones. The operative report after this procedure noted that the catheter was puncturing through the renal pelvis suggesting that the nephroureteral catheter was accessing the lower pole calyx but then exiting the collecting system and then re-entering the collecting system near the renal pelvis. IMPRESSION: 1. Placement of a right nephroureteral catheter using ultrasound and fluoroscopic guidance. Electronically Signed   By: Elene Griffes M.D.   On: 01/12/2024 18:06   DG Chest Port 1 View Result Date: 01/12/2024 CLINICAL DATA:  Right renal stone EXAM: PORTABLE CHEST 1 VIEW COMPARISON:  None Available. FINDINGS: Low lung volumes with bronchovascular crowding. Bibasilar patchy and linear opacities. No pleural effusion or pneumothorax. Enlarged cardiomediastinal silhouette is likely projectional. No acute osseous abnormality. Right upper quadrant surgical  clips. IMPRESSION: Low lung volumes with bronchovascular crowding. Bibasilar patchy and linear opacities, likely atelectasis. Electronically Signed   By: Limin  Xu M.D.   On: 01/12/2024 16:11   DG C-Arm 1-60 Min-No Report Result Date: 01/12/2024 Fluoroscopy was utilized by the requesting physician.  No radiographic interpretation.   DG C-Arm 1-60 Min-No Report Result Date: 01/12/2024 Fluoroscopy was utilized by the requesting physician.  No radiographic interpretation.   DG C-Arm 1-60 Min-No Report Result Date: 01/12/2024 Fluoroscopy was utilized by the requesting physician.  No radiographic interpretation.    Assessment/Plan: Helen Rice is POD 2 s/p R PCNL  --continue neph tube, foley --plan for stage 2 tomorrow --NPO at [place] tomorrow night   LOS: 2 days   Julene Oaks MD 01/14/2024, 7:59 AM [facility]  Pager: [phone]

## 2024-01-15 ENCOUNTER — Encounter (HOSPITAL_COMMUNITY): Payer: Self-pay | Admitting: Urology

## 2024-01-15 ENCOUNTER — Other Ambulatory Visit: Payer: Self-pay

## 2024-01-15 ENCOUNTER — Inpatient Hospital Stay (HOSPITAL_COMMUNITY)

## 2024-01-15 ENCOUNTER — Inpatient Hospital Stay (HOSPITAL_COMMUNITY): Admitting: Certified Registered"

## 2024-01-15 ENCOUNTER — Encounter (HOSPITAL_COMMUNITY)
Admission: RE | Disposition: A | Discharge status: Home / Self Care | Payer: Self-pay | Source: Home / Self Care | Attending: Urology

## 2024-01-15 ENCOUNTER — Inpatient Hospital Stay (HOSPITAL_COMMUNITY): Admission: RE | Admit: 2024-01-15 | Source: Home / Self Care | Admitting: Urology

## 2024-01-15 DIAGNOSIS — N200 Calculus of kidney: Secondary | ICD-10-CM

## 2024-01-15 HISTORY — PX: NEPHROLITHOTOMY: SHX5134

## 2024-01-15 HISTORY — PX: CYSTOSCOPY/URETEROSCOPY/HOLMIUM LASER/STENT PLACEMENT: SHX6546

## 2024-01-15 LAB — CBC WITH DIFFERENTIAL/PLATELET
Abs Immature Granulocytes: 0.05 10*3/uL (ref 0.00–0.07)
Basophils Absolute: 0 10*3/uL (ref 0.0–0.1)
Basophils Relative: 0 %
Eosinophils Absolute: 0 10*3/uL (ref 0.0–0.5)
Eosinophils Relative: 0 %
HCT: 30.1 % — ABNORMAL LOW (ref 36.0–46.0)
Hemoglobin: 9.9 g/dL — ABNORMAL LOW (ref 12.0–15.0)
Immature Granulocytes: 1 %
Lymphocytes Relative: 20 %
Lymphs Abs: 1.2 10*3/uL (ref 0.7–4.0)
MCH: 31 pg (ref 26.0–34.0)
MCHC: 32.9 g/dL (ref 30.0–36.0)
MCV: 94.4 fL (ref 80.0–100.0)
Monocytes Absolute: 0.4 10*3/uL (ref 0.1–1.0)
Monocytes Relative: 6 %
Neutro Abs: 4.4 10*3/uL (ref 1.7–7.7)
Neutrophils Relative %: 73 %
Platelets: 220 10*3/uL (ref 150–400)
RBC: 3.19 MIL/uL — ABNORMAL LOW (ref 3.87–5.11)
RDW: 13.6 % (ref 11.5–15.5)
WBC: 6 10*3/uL (ref 4.0–10.5)
nRBC: 0 % (ref 0.0–0.2)

## 2024-01-15 LAB — BASIC METABOLIC PANEL WITH GFR
Anion gap: 10 (ref 5–15)
BUN: 10 mg/dL (ref 6–20)
CO2: 24 mmol/L (ref 22–32)
Calcium: 8.6 mg/dL — ABNORMAL LOW (ref 8.9–10.3)
Chloride: 103 mmol/L (ref 98–111)
Creatinine, Ser: 0.73 mg/dL (ref 0.44–1.00)
GFR, Estimated: >60 mL/min (ref >60)
Glucose, Bld: 98 mg/dL (ref 70–99)
Potassium: 3.4 mmol/L — ABNORMAL LOW (ref 3.5–5.1)
Sodium: 137 mmol/L (ref 135–145)

## 2024-01-15 SURGERY — NEPHROLITHOTOMY PERCUTANEOUS SECOND LOOK
Anesthesia: General | Laterality: Right

## 2024-01-15 MED ORDER — SODIUM CHLORIDE 0.9 % IR SOLN
Status: DC | PRN
Start: 2024-01-15 — End: 2024-01-15
  Administered 2024-01-15: 27000 mL

## 2024-01-15 MED ORDER — SUCCINYLCHOLINE CHLORIDE 200 MG/10ML IV SOSY
PREFILLED_SYRINGE | INTRAVENOUS | Status: AC
  Filled 2024-01-15: qty 10

## 2024-01-15 MED ORDER — FENTANYL CITRATE (PF) 250 MCG/5ML IJ SOLN
INTRAMUSCULAR | Status: DC | PRN
  Administered 2024-01-15 (×2): 50 ug via INTRAVENOUS
  Administered 2024-01-15: 100 ug via INTRAVENOUS
  Administered 2024-01-15: 50 ug via INTRAVENOUS

## 2024-01-15 MED ORDER — LIDOCAINE HCL (PF) 2 % IJ SOLN
INTRAMUSCULAR | Status: AC
  Filled 2024-01-15: qty 5

## 2024-01-15 MED ORDER — SUGAMMADEX SODIUM 200 MG/2ML IV SOLN
INTRAVENOUS | Status: DC | PRN
  Administered 2024-01-15: 200 mg via INTRAVENOUS

## 2024-01-15 MED ORDER — SCOPOLAMINE 1 MG/3DAYS TD PT72
MEDICATED_PATCH | TRANSDERMAL | Status: AC
  Administered 2024-01-15: 1.5 mg via TRANSDERMAL
  Filled 2024-01-15: qty 1

## 2024-01-15 MED ORDER — IOHEXOL 300 MG/ML  SOLN
INTRAMUSCULAR | Status: DC | PRN
Start: 2024-01-15 — End: 2024-01-15
  Administered 2024-01-15: 50 mL

## 2024-01-15 MED ORDER — OXYCODONE-ACETAMINOPHEN 5-325 MG PO TABS: 1.0000 | ORAL_TABLET | ORAL | 0 refills | Status: DC | PRN

## 2024-01-15 MED ORDER — LACTATED RINGERS IV SOLN: INTRAVENOUS | Status: DC

## 2024-01-15 MED ORDER — FENTANYL CITRATE (PF) 250 MCG/5ML IJ SOLN
INTRAMUSCULAR | Status: AC
  Filled 2024-01-15: qty 5

## 2024-01-15 MED ORDER — LIDOCAINE 2% (20 MG/ML) 5 ML SYRINGE
INTRAMUSCULAR | Status: DC | PRN
  Administered 2024-01-15: 60 mg via INTRAVENOUS

## 2024-01-15 MED ORDER — ROCURONIUM 10MG/ML (10ML) SYRINGE FOR MEDFUSION PUMP - OPTIME
INTRAVENOUS | Status: DC | PRN
  Administered 2024-01-15: 60 mg via INTRAVENOUS

## 2024-01-15 MED ORDER — ORAL CARE MOUTH RINSE: 15.0000 mL | OROMUCOSAL | Status: DC | PRN

## 2024-01-15 MED ORDER — MIDAZOLAM HCL 2 MG/2ML IJ SOLN
INTRAMUSCULAR | Status: DC | PRN
  Administered 2024-01-15: 2 mg via INTRAVENOUS

## 2024-01-15 MED ORDER — ACETAMINOPHEN 500 MG PO TABS
ORAL_TABLET | ORAL | Status: AC
  Administered 2024-01-15: 1000 mg via ORAL
  Filled 2024-01-15: qty 2

## 2024-01-15 MED ORDER — MENTHOL 3 MG MT LOZG
1.0000 | LOZENGE | OROMUCOSAL | Status: DC | PRN
  Administered 2024-01-15: 3 mg via ORAL
  Filled 2024-01-15: qty 9

## 2024-01-15 MED ORDER — HYDROMORPHONE HCL 1 MG/ML IJ SOLN: 0.2500 mg | INTRAMUSCULAR | Status: DC | PRN

## 2024-01-15 MED ORDER — ACETAMINOPHEN 500 MG PO TABS: 1000.0000 mg | ORAL_TABLET | Freq: Once | ORAL | Status: AC

## 2024-01-15 MED ORDER — PROPOFOL 10 MG/ML IV BOLUS
INTRAVENOUS | Status: AC
Start: 2024-01-15 — End: ?
  Filled 2024-01-15: qty 20

## 2024-01-15 MED ORDER — PHENOL 1.4 % MT LIQD
1.0000 | OROMUCOSAL | Status: DC | PRN
  Administered 2024-01-15: 1 via OROMUCOSAL
  Filled 2024-01-15: qty 177

## 2024-01-15 MED ORDER — DOCUSATE SODIUM 100 MG PO CAPS: 100.0000 mg | ORAL_CAPSULE | Freq: Every day | ORAL | 0 refills | Status: AC | PRN

## 2024-01-15 MED ORDER — PROPOFOL 10 MG/ML IV BOLUS
INTRAVENOUS | Status: DC | PRN
Start: 2024-01-15 — End: 2024-01-15
  Administered 2024-01-15: 120 mg via INTRAVENOUS

## 2024-01-15 MED ORDER — CHLORHEXIDINE GLUCONATE 0.12 % MT SOLN
15.0000 mL | Freq: Once | OROMUCOSAL | Status: AC
  Administered 2024-01-15: 15 mL via OROMUCOSAL

## 2024-01-15 MED ORDER — ROCURONIUM BROMIDE 10 MG/ML (PF) SYRINGE
PREFILLED_SYRINGE | INTRAVENOUS | Status: AC
  Filled 2024-01-15: qty 10

## 2024-01-15 MED ORDER — CEPHALEXIN 500 MG PO CAPS: 500.0000 mg | ORAL_CAPSULE | Freq: Two times a day (BID) | ORAL | 0 refills | Status: AC

## 2024-01-15 MED ORDER — CEFAZOLIN SODIUM-DEXTROSE 2-3 GM-%(50ML) IV SOLR
INTRAVENOUS | Status: DC | PRN
Start: 2024-01-15 — End: 2024-01-15
  Administered 2024-01-15: 2 g via INTRAVENOUS

## 2024-01-15 MED ORDER — SUCCINYLCHOLINE 20MG/ML (10ML) SYRINGE FOR MEDFUSION PUMP - OPTIME
INTRAMUSCULAR | Status: DC | PRN
  Administered 2024-01-15: 40 mg via INTRAVENOUS
  Administered 2024-01-15: 100 mg via INTRAVENOUS

## 2024-01-15 MED ORDER — DEXAMETHASONE SODIUM PHOSPHATE 10 MG/ML IJ SOLN
INTRAMUSCULAR | Status: DC | PRN
  Administered 2024-01-15: 10 mg via INTRAVENOUS

## 2024-01-15 MED ORDER — SCOPOLAMINE 1 MG/3DAYS TD PT72: 1.0000 | MEDICATED_PATCH | TRANSDERMAL | Status: DC

## 2024-01-15 MED ORDER — MIDAZOLAM HCL 2 MG/2ML IJ SOLN
INTRAMUSCULAR | Status: AC
  Filled 2024-01-15: qty 2

## 2024-01-15 MED ORDER — ONDANSETRON HCL 4 MG/2ML IJ SOLN
INTRAMUSCULAR | Status: DC | PRN
  Administered 2024-01-15: 4 mg via INTRAVENOUS

## 2024-01-15 SURGICAL SUPPLY — 54 items
BAG COUNTER SPONGE SURGICOUNT (BAG) IMPLANT
BAG URINE DRAIN 2000ML AR STRL (UROLOGICAL SUPPLIES) ×1 IMPLANT
BAG URO CATCHER STRL LF (MISCELLANEOUS) ×1 IMPLANT
BASKET LASER NITINOL 1.9FR (BASKET) IMPLANT
BASKET ZERO TIP NITINOL 2.4FR (BASKET) IMPLANT
BENZOIN TINCTURE PRP APPL 2/3 (GAUZE/BANDAGES/DRESSINGS) ×1 IMPLANT
BLADE SURG 15 STRL LF DISP TIS (BLADE) ×1 IMPLANT
CATH FOLEY 2W COUNCIL 20FR 5CC (CATHETERS) IMPLANT
CATH FOLEY LF 20FR (CATHETERS) IMPLANT
CATH ROBINSON RED A/P 20FR (CATHETERS) IMPLANT
CATH URETERAL DUAL LUMEN 10F (MISCELLANEOUS) IMPLANT
CATH URETL OPEN 5X70 (CATHETERS) ×1 IMPLANT
CATH X-FORCE N30 NEPHROSTOMY (TUBING) IMPLANT
CLOTH BEACON ORANGE TIMEOUT ST (SAFETY) ×1 IMPLANT
COVER BACK TABLE 60X90IN (DRAPES) ×1 IMPLANT
DRAPE C-ARM 42X120 X-RAY (DRAPES) ×1 IMPLANT
DRAPE LINGEMAN PERC (DRAPES) ×1 IMPLANT
DRAPE SURG IRRIG POUCH 19X23 (DRAPES) ×1 IMPLANT
DRSG TEGADERM 2-3/8X2-3/4 SM (GAUZE/BANDAGES/DRESSINGS) IMPLANT
DRSG TEGADERM 8X12 (GAUZE/BANDAGES/DRESSINGS) ×2 IMPLANT
EXTRACTOR STONE PERC NCIRCLE (MISCELLANEOUS) IMPLANT
FIBER LASER MOSES 200 DFL (Laser) IMPLANT
GAUZE PAD ABD 8X10 STRL (GAUZE/BANDAGES/DRESSINGS) ×2 IMPLANT
GAUZE SPONGE 4X4 12PLY STRL (GAUZE/BANDAGES/DRESSINGS) IMPLANT
GLOVE BIOGEL M 7.0 STRL (GLOVE) ×1 IMPLANT
GOWN STRL REUS W/ TWL XL LVL3 (GOWN DISPOSABLE) ×1 IMPLANT
GUIDEWIRE AMPLAZ .035X145 (WIRE) IMPLANT
GUIDEWIRE ANG ZIPWIRE 038X150 (WIRE) ×1 IMPLANT
GUIDEWIRE STR DUAL SENSOR (WIRE) ×2 IMPLANT
GUIDEWIRE ZIPWRE .038 STRAIGHT (WIRE) IMPLANT
KIT BASIN OR (CUSTOM PROCEDURE TRAY) ×1 IMPLANT
KIT PROBE 340X3.4XDISP GRN (MISCELLANEOUS) IMPLANT
KIT PROBE TRILOGY 3.9X350 (MISCELLANEOUS) IMPLANT
KIT TURNOVER KIT A (KITS) IMPLANT
LASER FIB FLEXIVA PULSE ID 550 (Laser) IMPLANT
LASER FIB FLEXIVA PULSE ID 910 (Laser) IMPLANT
MANIFOLD NEPTUNE II (INSTRUMENTS) ×1 IMPLANT
NS IRRIG 1000ML POUR BTL (IV SOLUTION) ×1 IMPLANT
PACK CYSTO (CUSTOM PROCEDURE TRAY) ×1 IMPLANT
PAD PREP 24X48 CUFFED NSTRL (MISCELLANEOUS) ×1 IMPLANT
SHEATH DILATOR SET 8/10 (MISCELLANEOUS) IMPLANT
SHEATH NAVIGATOR HD 12/14X46 (SHEATH) IMPLANT
SPONGE T-LAP 4X18 ~~LOC~~+RFID (SPONGE) ×1 IMPLANT
STENT CONTOUR 7FRX24 (STENTS) IMPLANT
SURGIFLO W/THROMBIN 8M KIT (HEMOSTASIS) IMPLANT
SUT SILK 2 0 30 PSL (SUTURE) IMPLANT
SUT VIC AB 0 CT2 27 (SUTURE) IMPLANT
SUT VIC AB 4-0 PS2 18 (SUTURE) IMPLANT
SYR 10ML LL (SYRINGE) ×1 IMPLANT
SYR 20ML LL LF (SYRINGE) ×1 IMPLANT
TRACTIP FLEXIVA PULS ID 200XHI (Laser) IMPLANT
TRAY FOL W/BAG SLVR 16FR STRL (SET/KITS/TRAYS/PACK) IMPLANT
TUBING CONNECTING 10 (TUBING) ×1 IMPLANT
TUBING UROLOGY SET (TUBING) ×1 IMPLANT

## 2024-01-15 NOTE — Anesthesia Procedure Notes (Signed)
 Procedure Name: Intubation Date/Time: 01/15/2024 12:03 PM  Performed by: Delona Ferron, CRNAPre-anesthesia Checklist: Patient identified, Emergency Drugs available, Suction available, Patient being monitored and Timeout performed Patient Re-evaluated:Patient Re-evaluated prior to induction Oxygen Delivery Method: Circle system utilized Preoxygenation: Pre-oxygenation with 100% oxygen Induction Type: IV induction Ventilation: Mask ventilation without difficulty Laryngoscope Size: Glidescope and 3 Grade View: Grade I Tube type: Parker flex tip Tube size: 7.0 mm Number of attempts: 1 Airway Equipment and Method: Video-laryngoscopy and Stylet Placement Confirmation: ETT inserted through vocal cords under direct vision, positive ETCO2 and breath sounds checked- equal and bilateral Secured at: 21 cm Tube secured with: Tape Dental Injury: Teeth and Oropharynx as per pre-operative assessment

## 2024-01-15 NOTE — Anesthesia Postprocedure Evaluation (Signed)
 Anesthesia Post Note  Patient: Aziya A Molyneux  Procedure(s) Performed: NEPHROLITHOTOMY PERCUTANEOUS SECOND LOOK (Right) CYSTOSCOPY/URETEROSCOPY/HOLMIUM LASER/STENT PLACEMENT (Right)     Patient location during evaluation: PACU Anesthesia Type: General Level of consciousness: awake and alert Pain management: pain level controlled Vital Signs Assessment: post-procedure vital signs reviewed and stable Respiratory status: spontaneous breathing, nonlabored ventilation and respiratory function stable Cardiovascular status: blood pressure returned to baseline and stable Postop Assessment: no apparent nausea or vomiting Anesthetic complications: no   No notable events documented.  Last Vitals:  Vitals:   01/15/24 1615 01/15/24 1623  BP: 126/79 136/76  Pulse: 78 75  Resp: 12 16  Temp:  36.5 C  SpO2: 97% 98%    Last Pain:  Vitals:   01/15/24 1623  TempSrc:   PainSc: 0-No pain                 Earvin Goldberg

## 2024-01-15 NOTE — H&P (View-Only) (Signed)
*   Day of Surgery * Subjective: Denies pain. No nausea or emesis. Tolerating drains.  Objective: Vital signs in last 24 hours: Temp:  [98.4 F (36.9 C)-99.4 F (37.4 C)] 99.4 F (37.4 C) (06/09 0840) Pulse Rate:  [69-99] 91 (06/09 0840) Resp:  [16-20] 16 (06/09 0840) BP: (114-125)/(67-70) 120/70 (06/09 0840) SpO2:  [94 %-99 %] 94 % (06/09 0840) Weight:  [60 kg] 60 kg (06/09 0957)  Intake/Output from previous day: 06/08 0701 - 06/09 0700 In: 843 [P.O.:840; I.V.:3] Out: 1850 [Urine:1850] Intake/Output this shift: No intake/output data recorded.  Physical Exam:  General: Alert and oriented CV: RRR Lungs: Clear Abdomen: Soft, ND, NT, PCN and foley slight pink tinge Ext: NT, No erythema  Lab Results: Recent Labs    01/12/24 1624 01/13/24 0541 01/15/24 0512  HGB 14.6 11.7* 9.9*  HCT 45.3 36.0 30.1*   BMET Recent Labs    01/13/24 0541 01/15/24 0512  NA 137 137  K 4.0 3.4*  CL 109 103  CO2 20* 24  GLUCOSE 126* 98  BUN 12 10  CREATININE 0.98 0.73  CALCIUM 8.4* 8.6*     Studies/Results: No results found.  Assessment/Plan: Right staghorn stone  -To OR today for second look right PCNL   LOS: 3 days   Matt R. Michaila Kenney MD 01/15/2024, 11:15 AM [facility]  Pager: [phone]

## 2024-01-15 NOTE — Anesthesia Preprocedure Evaluation (Addendum)
 Anesthesia Evaluation  Patient identified by MRN, date of birth, ID band Patient awake    Reviewed: Allergy & Precautions, H&P , NPO status , Patient's Chart, lab work & pertinent test results  History of Anesthesia Complications (+) DIFFICULT AIRWAY and history of anesthetic complications  Airway Mallampati: III  TM Distance: >3 FB Neck ROM: Full    Dental no notable dental hx. (+) Teeth Intact, Dental Advisory Given   Pulmonary former smoker   Pulmonary exam normal breath sounds clear to auscultation       Cardiovascular negative cardio ROS  Rhythm:Regular Rate:Normal     Neuro/Psych  Headaches  Anxiety Depression       GI/Hepatic Neg liver ROS,GERD  ,,  Endo/Other  negative endocrine ROS    Renal/GU negative Renal ROS  negative genitourinary   Musculoskeletal   Abdominal   Peds  Hematology  (+) Blood dyscrasia, anemia   Anesthesia Other Findings   Reproductive/Obstetrics negative OB ROS                             Anesthesia Physical Anesthesia Plan  ASA: 2  Anesthesia Plan: General   Post-op Pain Management: Tylenol  PO (pre-op)*   Induction: Intravenous  PONV Risk Score and Plan: 4 or greater and Ondansetron , Dexamethasone  and Midazolam   Airway Management Planned: Oral ETT and Video Laryngoscope Planned  Additional Equipment:   Intra-op Plan:   Post-operative Plan: Extubation in OR  Informed Consent: I have reviewed the patients History and Physical, chart, labs and discussed the procedure including the risks, benefits and alternatives for the proposed anesthesia with the patient or authorized representative who has indicated his/her understanding and acceptance.     Dental advisory given  Plan Discussed with: CRNA  Anesthesia Plan Comments:        Anesthesia Quick Evaluation

## 2024-01-15 NOTE — Op Note (Signed)
 Operative Note  Preoperative diagnosis:  1.  Right staghorn stone  Postoperative diagnosis: 1.  Right staghorn stone   Procedure(s): 1. Right percutaneous nephrolithotomy (greater than 2 cm) 2. Dilation of right renal percutaneous tract under fluoroscopy 3. Right antegrade ureteroscopy with basket stone extraction 4. Right antegrade nephrostogram 5. Fluoroscopy time less than 1 hour with interpretation 6. Right ureteral stent placement 7. Right percutaneous nephrostomy tube placement  Surgeon: Doy Gene, MD  Assistants:  None  Anesthesia:  General  Complications:  None  EBL:  10ml  Specimens: 1. Stones for stone analysis  Drains/Catheters: 1.   A 16-French Foley. 2.   Right 20-French nephrostomy tube 3. Right 7Fr by 24cm ureteral stent  Intraoperative findings:   Large right upper pole residual staghorn stone that was fragmented and all fragments removed at the end the case with no residual fragments in the collecting system or ureter. Successful right 7 [place] by 24 cm ureteral stent placed. Successful right nephrostomy tube placement.  Indication:  Helen Rice is a 48 y.o. female with a history of a staghorn stone.  She underwent first left percutaneous nephrolithotomy on Friday and presents today for second look percutaneous nephrolithotomy. After a thorough discussion of the risks, benefits, and alternatives of the surgery, pt agreed to proceed.    Description of procedure: After informed consent was obtained from the patient, the patient was identified, the patient was brought to the operating room and placed in supine position.   General anesthesia was administered as well as perioperative IV antibiotics with Ancef.  At the beginning of the case, a time-out was performed to properly identify the patient, the surgery to be performed, and the surgical site. Sequential compression devices were applied to lower extremities at the beginning of the case for DVT  prophylaxis.   The patient was then placed into a prone position onto gel rolls, making sure that all pressure points were properly padded.  The patient's right flank and genitalia were then prepped and draped in sterile fashion.  We placed a Foley sterilely in the patient in prone position.  A sensor wire was passed through the 4-French open-ended catheter down to the bladder.  Additionally, I passed a Super Stiff wire through the existing nephrostomy tube after the shaft skin was cut.  Nephrostomy tube was removed and the, catheter was then removed.  An antegrade nephrostogram was performed which demonstrated large partial staghorn right upper pole stone and no extravasation of contrast.  I then entered the collecting system with a flexible cystoscope.  This was quite tedious however was able to angulate the scope into the upper pole and used a 200 m holmium laser fiber to fragment the stone.  Given its location, this is quite difficult however ultimately, was able to fragment the entirety of the stone and then the I used a 0 tip basket and retrieve these large fragments in place of the renal pelvis.  Following this, a site was more efficient to use the trilogy to clear the remaining fragments.  Thus, the Bard X force balloon was then passed over the superstiff wire until the radiopaque tip was well into the interpolar calyx.  We dilated this balloon to 18 cm of water pressure.  We advanced the sheath over the balloon.  We deflated the balloon, leaving the wire in place.   The rigid nephroscope and the Trilogy were inserted into the kidney. The stone burden was encountered. The Trilogy was used to fragement and suction out  the stone. The Perc N-circle was used to remove large fragments. All fragments were removed.  I then used the flexible cystoscope and perform nephroscopy of the remainder of the collecting system noting no further large stone fragments.  I then used the ureteroscope and passed this  down the level of the ureter.  I used a 0 tip basket and removed residual stone fragments in the proximal ureter.  I was unable to advance the ureteroscope all the way to the bladder.  Prior to removing the ureteroscope, I passed a 0.038 sensor wire in the squad in the bladder.  Over this wire, under direct guidance with the rigid nephroscope, I passed a 7 [place] by 24 cm ureteral stent.  There was a curl seen distally likely in the bladder and the curl within the renal pelvis.  I then performed repeat antegrade pyelogram noting no extravasation of contrast and no filling defects.  I then passed a 20 [place] silicone catheter into the renal pelvis and remove the sheath and all remaining safety wires.   We then sutured the Councill tip catheter into position using a couple of 0 silk sutures.  The incision was closed with a 2-0 vicryl suture. Sterile 4x4's and ABD pad were placed around the nephrostomy tube.  Several large OpSite dressings were placed over this.    At this point, the procedure was completed.  Patient was then transferred to the supine position and recovery room in stable condition. The patient tolerated the procedure well.  There were no immediate complications.  Plan:  Patient will be observed overnight. CT A/P non-con will be obtained to assess remaining stone burden. We will plan to clamp the nephrostomy tube tomorrow and remove prior to discharge tomorrow if no increased pain or fevers.  Matt R. Calab Sachse MD [facility]  Pager: [phone]

## 2024-01-15 NOTE — OR Nursing (Signed)
Stone taken by Dr. Gay. ?

## 2024-01-15 NOTE — Progress Notes (Signed)
   01/15/24 1038  TOC Brief Assessment  Insurance and Status Reviewed  Patient has primary care physician Yes  Home environment has been reviewed home w/ spouse  Prior level of function: independent  Prior/Current Home Services No current home services  Social Drivers of Health Review SDOH reviewed no interventions necessary  Readmission risk has been reviewed Yes  Transition of care needs no transition of care needs at this time

## 2024-01-15 NOTE — Interval H&P Note (Signed)
 History and Physical Interval Note:  01/15/2024 11:16 AM  Helen Rice  has presented today for surgery, with the diagnosis of RIGHT RENAL STONE.  The various methods of treatment have been discussed with the patient and family. After consideration of risks, benefits and other options for treatment, the patient has consented to  Procedure(s) with comments: NEPHROLITHOTOMY PERCUTANEOUS SECOND LOOK (Right) CYSTOSCOPY/URETEROSCOPY/HOLMIUM LASER/STENT PLACEMENT (Right) - WITH RIGHT RETROGRADE PYELOGRAM as a surgical intervention.  The patient's history has been reviewed, patient examined, no change in status, stable for surgery.  I have reviewed the patient's chart and labs.  Questions were answered to the patient's satisfaction.     Lahoma Pigg

## 2024-01-15 NOTE — Transfer of Care (Signed)
 Immediate Anesthesia Transfer of Care Note  Patient: Helen Rice  Procedure(s) Performed: NEPHROLITHOTOMY PERCUTANEOUS SECOND LOOK (Right) CYSTOSCOPY/URETEROSCOPY/HOLMIUM LASER/STENT PLACEMENT (Right)  Patient Location: PACU  Anesthesia Type:General  Level of Consciousness: drowsy and patient cooperative  Airway & Oxygen Therapy: Patient Spontanous Breathing  Post-op Assessment: Report given to RN and Post -op Vital signs reviewed and stable  Post vital signs: Reviewed and stable  Last Vitals:  Vitals Value Taken Time  BP 153/82 01/15/24 1547  Temp    Pulse 92 01/15/24 1548  Resp 14 01/15/24 1548  SpO2 91 % 01/15/24 1548  Vitals shown include unfiled device data.  Last Pain:  Vitals:   01/15/24 0957  TempSrc:   PainSc: 0-No pain      Patients Stated Pain Goal: 1 (01/15/24 0600)  Complications: No notable events documented.

## 2024-01-15 NOTE — Discharge Instructions (Signed)
 [facility] [phone] Post Ureteroscopy With or Without Stent Instructions  Definitions:  Ureter: The duct that transports urine from the kidney to the bladder. Stent:   A plastic hollow tube that is placed into the ureter, from the kidney to the bladder to prevent the ureter from swelling shut.  GENERAL INSTRUCTIONS:  Despite the fact that no skin incisions were used, the area around the ureter and bladder is raw and irritated. The stent is a foreign body which will further irritate the bladder wall. This irritation is manifested by increased frequency of urination, both day and night, and by an increase in the urge to urinate. In some, the urge to urinate is present almost always. Sometimes the urge is strong enough that you may not be able to stop yourself from urinating. The only real cure is to remove the stent and then give time for the bladder wall to heal which can't be done until the danger of the ureter swelling shut has passed, which varies.  You may see some blood in your urine while the stent is in place and a few days afterwards. Do not be alarmed, even if the urine was clear for a while. Get off your feet and drink lots of fluids until clearing occurs. If you start to pass clots or don't improve, call us .  DIET: You may return to your normal diet immediately. Because of the raw surface of your bladder, alcohol , spicy foods, acid type foods and drinks with caffeine may cause irritation or frequency and should be used in moderation. To keep your urine flowing freely and to avoid constipation, drink plenty of fluids during the day ( 8-10 glasses ). Tip: Avoid cranberry juice because it is very acidic.  ACTIVITY: Your physical activity doesn't need to be restricted. However, if you are very active, you may see some blood in your urine. We suggest that you reduce your activity under these circumstances until the bleeding has stopped.  BOWELS: It is important to  keep your bowels regular during the postoperative period. Straining with bowel movements can cause bleeding. A bowel movement every other day is reasonable. Use a mild laxative if needed, such as Milk of Magnesia 2-3 tablespoons, or 2 Dulcolax tablets. Call if you continue to have problems. If you have been taking narcotics for pain, before, during or after your surgery, you may be constipated. Take a laxative if necessary.   MEDICATION: You should resume your pre-surgery medications unless told not to. In addition you will often be given an antibiotic to prevent infection. These should be taken as prescribed until the bottles are finished unless you are having an unusual reaction to one of the drugs.  PROBLEMS YOU SHOULD REPORT TO US : Fevers over 100.5 Fahrenheit. Heavy bleeding, or clots ( See above notes about blood in urine ). Inability to urinate. Drug reactions ( hives, rash, nausea, vomiting, diarrhea ). Severe burning or pain with urination that is not improving.  FOLLOW-UP: You will need a follow-up appointment to monitor your progress. Call for this appointment at the number listed above. Usually the first appointment will be about three to fourteen days after your surgery.  You have a stent in your kidney and will followup as scheduled for stent removal in the office.

## 2024-01-15 NOTE — Progress Notes (Signed)
*   Day of Surgery * Subjective: Denies pain. No nausea or emesis. Tolerating drains.  Objective: Vital signs in last 24 hours: Temp:  [98.4 F (36.9 C)-99.4 F (37.4 C)] 99.4 F (37.4 C) (06/09 0840) Pulse Rate:  [69-99] 91 (06/09 0840) Resp:  [16-20] 16 (06/09 0840) BP: (114-125)/(67-70) 120/70 (06/09 0840) SpO2:  [94 %-99 %] 94 % (06/09 0840) Weight:  [60 kg] 60 kg (06/09 0957)  Intake/Output from previous day: 06/08 0701 - 06/09 0700 In: 843 [P.O.:840; I.V.:3] Out: 1850 [Urine:1850] Intake/Output this shift: No intake/output data recorded.  Physical Exam:  General: Alert and oriented CV: RRR Lungs: Clear Abdomen: Soft, ND, NT, PCN and foley slight pink tinge Ext: NT, No erythema  Lab Results: Recent Labs    01/12/24 1624 01/13/24 0541 01/15/24 0512  HGB 14.6 11.7* 9.9*  HCT 45.3 36.0 30.1*   BMET Recent Labs    01/13/24 0541 01/15/24 0512  NA 137 137  K 4.0 3.4*  CL 109 103  CO2 20* 24  GLUCOSE 126* 98  BUN 12 10  CREATININE 0.98 0.73  CALCIUM 8.4* 8.6*     Studies/Results: No results found.  Assessment/Plan: Right staghorn stone  -To OR today for second look right PCNL   LOS: 3 days   Matt R. Michaila Kenney MD 01/15/2024, 11:15 AM [facility]  Pager: [phone]

## 2024-01-16 ENCOUNTER — Inpatient Hospital Stay (HOSPITAL_COMMUNITY)

## 2024-01-16 ENCOUNTER — Encounter (HOSPITAL_COMMUNITY): Payer: Self-pay | Admitting: Urology

## 2024-01-16 LAB — CBC WITH DIFFERENTIAL/PLATELET
Abs Immature Granulocytes: 0.07 10*3/uL (ref 0.00–0.07)
Basophils Absolute: 0 10*3/uL (ref 0.0–0.1)
Basophils Relative: 0 %
Eosinophils Absolute: 0 10*3/uL (ref 0.0–0.5)
Eosinophils Relative: 0 %
HCT: 30.9 % — ABNORMAL LOW (ref 36.0–46.0)
Hemoglobin: 10.2 g/dL — ABNORMAL LOW (ref 12.0–15.0)
Immature Granulocytes: 1 %
Lymphocytes Relative: 9 %
Lymphs Abs: 0.7 10*3/uL (ref 0.7–4.0)
MCH: 30.4 pg (ref 26.0–34.0)
MCHC: 33 g/dL (ref 30.0–36.0)
MCV: 92.2 fL (ref 80.0–100.0)
Monocytes Absolute: 0.3 10*3/uL (ref 0.1–1.0)
Monocytes Relative: 4 %
Neutro Abs: 6.7 10*3/uL (ref 1.7–7.7)
Neutrophils Relative %: 86 %
Platelets: 248 10*3/uL (ref 150–400)
RBC: 3.35 MIL/uL — ABNORMAL LOW (ref 3.87–5.11)
RDW: 13.5 % (ref 11.5–15.5)
WBC: 7.7 10*3/uL (ref 4.0–10.5)
nRBC: 0 % (ref 0.0–0.2)

## 2024-01-16 LAB — BASIC METABOLIC PANEL WITH GFR
Anion gap: 10 (ref 5–15)
BUN: 12 mg/dL (ref 6–20)
CO2: 25 mmol/L (ref 22–32)
Calcium: 8.9 mg/dL (ref 8.9–10.3)
Chloride: 106 mmol/L (ref 98–111)
Creatinine, Ser: 0.84 mg/dL (ref 0.44–1.00)
GFR, Estimated: >60 mL/min (ref >60)
Glucose, Bld: 142 mg/dL — ABNORMAL HIGH (ref 70–99)
Potassium: 3.9 mmol/L (ref 3.5–5.1)
Sodium: 141 mmol/L (ref 135–145)

## 2024-01-16 NOTE — Plan of Care (Signed)

## 2024-01-16 NOTE — Discharge Summary (Signed)
 Date of admission: 01/12/2024  Date of discharge: 01/16/2024  Admission diagnosis: Right staghorn stone  Discharge diagnosis: Right staghorn stone  Secondary diagnoses: None  History and Physical: For full details, please see admission history and physical. Briefly, Helen Rice is a 48 y.o. year old patient with a right staghorn stone who underwent staged PCNL.   Hospital Course: The patient recovered in the usual expected fashion from staged PCNL to treat her right staghorn stone.  She had her diet advanced slowly.  Initially managed with IV pain control, then transitioned to PO meds when she was tolerating oral intake.  Her labs were stable throughout the hospital course. Post-op CT revealed minimal residual fragment. Her PCN and foley were removed.  She was discharged to home on POD#4/1.  At the time of discharge she was tolerating a regular diet, passing flatus, ambulating, had adequate pain control and was agreeable to discharge.  Follow up as scheduled.    Laboratory values:  Recent Labs    01/15/24 0512 01/16/24 0506  HGB 9.9* 10.2*  HCT 30.1* 30.9*   Recent Labs    01/15/24 0512 01/16/24 0506  CREATININE 0.73 0.84    Disposition: Home  Discharge instruction: The patient was instructed to be ambulatory but told to refrain from heavy lifting, strenuous activity, or driving.   Discharge medications:  Allergies as of 01/16/2024       Reactions   Influenza Vaccines    Sweating and chills    Elemental Sulfur Rash   Latex Rash        Medication List     TAKE these medications    buPROPion  150 MG 24 hr tablet Commonly known as: Wellbutrin  XL Take 1 tablet (150 mg total) by mouth in the morning.   cephALEXin  500 MG capsule Commonly known as: KEFLEX  1 p.o. every 12 hours x 5 days, then 1 p.o. daily until you see a specialist in [place] What changed: Another medication with the same name was added. Make sure you understand how and when to take each.    cephALEXin  500 MG capsule Commonly known as: KEFLEX  Take 1 capsule (500 mg total) by mouth 2 (two) times daily for 6 doses. Take day prior, day of and day after stent removal in the office. What changed: You were already taking a medication with the same name, and this prescription was added. Make sure you understand how and when to take each.   conjugated estrogens  0.625 MG/GM vaginal cream Commonly known as: PREMARIN  Place 1 Applicatorful vaginally daily. What changed: when to take this   desvenlafaxine  100 MG 24 hr tablet Commonly known as: PRISTIQ  Take 1 tablet (100 mg total) by mouth daily. What changed: when to take this   docusate sodium 100 MG capsule Commonly known as: Colace Take 1 capsule (100 mg total) by mouth daily as needed for up to 30 doses.   estradiol  0.1 mg/24hr patch Commonly known as: Climara  Place 1 patch (0.1 mg total) onto the skin once a week.   hydrOXYzine  25 MG tablet Commonly known as: ATARAX  Take 0.5-1 tablets (12.5-25 mg total) by mouth every 8 (eight) hours as needed for anxiety.   oxyCODONE -acetaminophen  5-325 MG tablet Commonly known as: Percocet Take 1 tablet by mouth every 4 (four) hours as needed for up to 18 doses for severe pain (pain score 7-10).       ASK your doctor about these medications    valACYclovir  1000 MG tablet Commonly known as: VALTREX  Take 1  tablet (1,000 mg total) by mouth 3 (three) times daily for 7 days. Ask about: Should I take this medication?        Followup:   Follow-up Information     Lahoma Pigg, MD Follow up on 01/25/2024.   Specialty: Urology Why: 8:30AM Contact information: [address] [place] [address] [phone]                 Euel Herring. Kavish Lafitte MD [facility]  Pager: [phone]

## 2024-01-30 ENCOUNTER — Encounter: Payer: Self-pay | Admitting: Family Medicine

## 2024-01-30 ENCOUNTER — Ambulatory Visit (INDEPENDENT_AMBULATORY_CARE_PROVIDER_SITE_OTHER): Admitting: Family Medicine

## 2024-01-30 VITALS — BP 131/78 | HR 89 | Temp 97.9°F | Ht 62.0 in | Wt 132.2 lb

## 2024-01-30 DIAGNOSIS — E7800 Pure hypercholesterolemia, unspecified: Secondary | ICD-10-CM

## 2024-01-30 DIAGNOSIS — F331 Major depressive disorder, recurrent, moderate: Secondary | ICD-10-CM

## 2024-01-30 DIAGNOSIS — N200 Calculus of kidney: Secondary | ICD-10-CM

## 2024-01-30 MED ORDER — BUPROPION HCL ER (XL) 300 MG PO TB24: 300.0000 mg | ORAL_TABLET | Freq: Every morning | ORAL | 1 refills | Status: DC

## 2024-01-30 NOTE — Addendum Note (Signed)
 Addended by: JOLINDA NORENE HERO on: 01/30/2024 04:03 PM   Modules accepted: Orders

## 2024-01-30 NOTE — Progress Notes (Signed)
 Subjective: CC: Depression PCP: Jolinda Norene HERO, DO YEP:Helen Rice is a 48 y.o. female presenting to clinic today for:  1.  Depression She reports that the Wellbutrin  has been helpful and seems to be supporting the Pristiq .  She did notice an improvement in focus and energy initially but that has since leveled off and she wants to advance the dose further  2.  Staghorn calculus Status post removal.  She has stent placement currently.  She will be seeing them again on Friday to remove stent.  Reports some urinary pressure due to stent in place.  Wants to review CT   ROS: Per HPI  Allergies  Allergen Reactions   Influenza Vaccines     Sweating and chills    Elemental Sulfur Rash   Latex Rash   Past Medical History:  Diagnosis Date   Anemia    hx of   Anxiety    Depression    Difficult intubation    GERD (gastroesophageal reflux disease) 05/30/2017   History of kidney stones    Lyme disease    being seen by [facility]   Pneumonia    Psoriasis     Current Outpatient Medications:    buPROPion  (WELLBUTRIN  XL) 150 MG 24 hr tablet, Take 1 tablet (150 mg total) by mouth in the morning., Disp: 90 tablet, Rfl: 3   cephALEXin  (KEFLEX ) 500 MG capsule, 1 p.o. every 12 hours x 5 days, then 1 p.o. daily until you see a specialist in [place], Disp: 40 capsule, Rfl: 1   conjugated estrogens  (PREMARIN ) vaginal cream, Place 1 Applicatorful vaginally daily. (Patient taking differently: Place 1 Applicatorful vaginally every 3 (three) days.), Disp: 42.5 g, Rfl: 12   desvenlafaxine  (PRISTIQ ) 100 MG 24 hr tablet, Take 1 tablet (100 mg total) by mouth daily. (Patient taking differently: Take 100 mg by mouth at bedtime.), Disp: 90 tablet, Rfl: 3   docusate sodium  (COLACE) 100 MG capsule, Take 1 capsule (100 mg total) by mouth daily as needed for up to 30 doses., Disp: 30 capsule, Rfl: 0   estradiol  (CLIMARA ) 0.1 mg/24hr patch, Place 1 patch (0.1 mg total) onto the skin  once a week., Disp: 12 patch, Rfl: 0   hydrOXYzine  (ATARAX ) 25 MG tablet, Take 0.5-1 tablets (12.5-25 mg total) by mouth every 8 (eight) hours as needed for anxiety., Disp: 90 tablet, Rfl: 1   oxyCODONE -acetaminophen  (PERCOCET) 5-325 MG tablet, Take 1 tablet by mouth every 4 (four) hours as needed for up to 18 doses for severe pain (pain score 7-10)., Disp: 18 tablet, Rfl: 0 Social History   Socioeconomic History   Marital status: Married    Spouse name: Not on file   Number of children: 1   Years of education: GED   Highest education level: Not on file  Occupational History   Occupation: [employer]  Tobacco Use   Smoking status: Former    Current packs/day: 0.00    Average packs/day: 1.5 packs/day for 15.0 years (22.5 ttl pk-yrs)    Types: Cigarettes    Start date: 2001    Quit date: 2016    Years since quitting: 9.4   Smokeless tobacco: Never  Vaping Use   Vaping status: Every Day   Substances: Nicotine  Substance and Sexual Activity   Alcohol  use: Yes    Comment: occ   Drug use: No   Sexual activity: Yes    Birth control/protection: Surgical  Other Topics Concern   Not on file  Social  History Narrative   Right handed    Caffeine use: Drinks 1 cup coffee per day   1-3 sodas per day   Lives with husband   Social Drivers of Corporate investment banker Strain: Not on file  Food Insecurity: No Food Insecurity (01/12/2024)   Hunger Vital Sign    Worried About Running Out of Food in the Last Year: Never true    Ran Out of Food in the Last Year: Never true  Transportation Needs: No Transportation Needs (01/12/2024)   PRAPARE - Administrator, Civil Service (Medical): No    Lack of Transportation (Non-Medical): No  Physical Activity: Not on file  Stress: Not on file  Social Connections: Not on file  Intimate Partner Violence: Not At Risk (01/12/2024)   Humiliation, Afraid, Rape, and Kick questionnaire    Fear of Current or Ex-Partner: No    Emotionally Abused: No     Physically Abused: No    Sexually Abused: No   Family History  Problem Relation Age of Onset   Cancer Father    Psoriasis Father    Autoimmune disease Father    Breast cancer Sister    Heart attack Brother 60       had a second MI which required quad bypass 73yr   Heart attack Brother 64       MI w/ stent   Thyroid  disease Maternal Aunt    Heart disease Maternal Grandmother    Heart disease Maternal Grandfather    Ovarian cancer Paternal Grandmother 39   Ovarian cancer Paternal Grandfather     Objective: Office vital signs reviewed. BP 131/78   Pulse 89   Temp 97.9 F (36.6 C)   Ht 5' 2 (1.575 m)   Wt 132 lb 3.2 oz (60 kg)   SpO2 98%   BMI 24.18 kg/m   Physical Examination:  General: Awake, alert, well nourished, No acute distress HEENT: sclera white, MMM Cardio: regular rate and rhythm, S1S2 heard, no murmurs appreciated Pulm: clear to auscultation bilaterally, no wheezes, rhonchi or rales; normal work of breathing on room air GU: Right CVA with 2 postop sites.  The inferior site has slight drainage but this appears to be serosanguineous.  No evidence of secondary bacterial infection     01/30/2024    2:59 PM 11/06/2023    8:16 AM 09/19/2023    9:48 AM  Depression screen PHQ 2/9  Decreased Interest 0 1 0  Down, Depressed, Hopeless 0 1 0  PHQ - 2 Score 0 2 0  Altered sleeping 1 0   Tired, decreased energy 1 2   Change in appetite 1 1   Feeling bad or failure about yourself  0 0   Trouble concentrating 1 1   Moving slowly or fidgety/restless 0 0   Suicidal thoughts 0 0   PHQ-9 Score 4 6   Difficult doing work/chores Somewhat difficult Somewhat difficult       01/30/2024    2:58 PM 11/06/2023    8:16 AM 08/04/2023    8:06 AM 03/22/2023    4:21 PM  GAD 7 : Generalized Anxiety Score  Nervous, Anxious, on Edge 0 1 0 1  Control/stop worrying 1 0 0 1  Worry too much - different things 0 1 0 1  Trouble relaxing 0 0 3 1  Restless 0 0 1 1  Easily annoyed or  irritable 0 1 2 1   Afraid - awful might happen 0 0 0  1  Total GAD 7 Score 1 3 6 7   Anxiety Difficulty Somewhat difficult Somewhat difficult Not difficult at all Extremely difficult      Assessment/ Plan: 48 y.o. female   Moderate episode of recurrent major depressive disorder (HCC) - Plan: buPROPion  (WELLBUTRIN  XL) 300 MG 24 hr tablet  Staghorn calculus  Advance to 3 mg of Wellbutrin  daily.  Continue Pristiq  at 100 mg.  Staghorn calculus status post treatment.  Has stent removal Friday.  We reviewed her CAT scan and the atelectasis noted   Norene CHRISTELLA Fielding, DO [facility] [phone]

## 2024-01-31 ENCOUNTER — Other Ambulatory Visit

## 2024-01-31 DIAGNOSIS — E7800 Pure hypercholesterolemia, unspecified: Secondary | ICD-10-CM

## 2024-02-01 LAB — LIPID PANEL
Chol/HDL Ratio: 5.8 ratio — ABNORMAL HIGH (ref 0.0–4.4)
Cholesterol, Total: 220 mg/dL — ABNORMAL HIGH (ref 100–199)
HDL: 38 mg/dL — ABNORMAL LOW (ref >39)
LDL Chol Calc (NIH): 144 mg/dL — ABNORMAL HIGH (ref 0–99)
Triglycerides: 207 mg/dL — ABNORMAL HIGH (ref 0–149)
VLDL Cholesterol Cal: 38 mg/dL (ref 5–40)

## 2024-02-02 ENCOUNTER — Ambulatory Visit: Payer: Self-pay | Admitting: Family Medicine

## 2024-02-12 ENCOUNTER — Ambulatory Visit: Admitting: Family Medicine

## 2024-03-19 ENCOUNTER — Other Ambulatory Visit (HOSPITAL_COMMUNITY): Payer: Self-pay | Admitting: Family Medicine

## 2024-03-19 DIAGNOSIS — Z1231 Encounter for screening mammogram for malignant neoplasm of breast: Secondary | ICD-10-CM

## 2024-03-25 ENCOUNTER — Ambulatory Visit (HOSPITAL_COMMUNITY)
Admission: RE | Admit: 2024-03-25 | Discharge: 2024-03-25 | Disposition: A | Discharge status: Home / Self Care | Source: Ambulatory Visit | Attending: Family Medicine | Admitting: Family Medicine

## 2024-03-25 DIAGNOSIS — Z1231 Encounter for screening mammogram for malignant neoplasm of breast: Secondary | ICD-10-CM | POA: Insufficient documentation

## 2024-04-03 ENCOUNTER — Other Ambulatory Visit: Payer: Self-pay | Admitting: Family Medicine

## 2024-04-03 DIAGNOSIS — Z90710 Acquired absence of both cervix and uterus: Secondary | ICD-10-CM

## 2024-05-23 ENCOUNTER — Encounter (INDEPENDENT_AMBULATORY_CARE_PROVIDER_SITE_OTHER): Payer: Self-pay | Admitting: Family Medicine

## 2024-05-23 DIAGNOSIS — R4189 Other symptoms and signs involving cognitive functions and awareness: Secondary | ICD-10-CM

## 2024-05-23 DIAGNOSIS — R5382 Chronic fatigue, unspecified: Secondary | ICD-10-CM

## 2024-05-24 NOTE — Telephone Encounter (Signed)

## 2024-05-29 ENCOUNTER — Other Ambulatory Visit: Payer: Self-pay | Admitting: Urology

## 2024-05-29 ENCOUNTER — Inpatient Hospital Stay (HOSPITAL_COMMUNITY)

## 2024-05-29 ENCOUNTER — Ambulatory Visit (HOSPITAL_COMMUNITY)
Admission: EM | Admit: 2024-05-29 | Discharge: 2024-05-29 | Disposition: A | Discharge status: Home / Self Care | Source: Ambulatory Visit | Attending: Urology | Admitting: Urology

## 2024-05-29 ENCOUNTER — Encounter (HOSPITAL_COMMUNITY)
Admission: EM | Disposition: A | Discharge status: Home / Self Care | Payer: Self-pay | Source: Ambulatory Visit | Attending: Urology

## 2024-05-29 ENCOUNTER — Other Ambulatory Visit: Payer: Self-pay

## 2024-05-29 ENCOUNTER — Inpatient Hospital Stay (HOSPITAL_COMMUNITY): Admitting: Anesthesiology

## 2024-05-29 ENCOUNTER — Encounter (HOSPITAL_COMMUNITY): Payer: Self-pay | Admitting: Urology

## 2024-05-29 DIAGNOSIS — F418 Other specified anxiety disorders: Secondary | ICD-10-CM | POA: Insufficient documentation

## 2024-05-29 DIAGNOSIS — F17290 Nicotine dependence, other tobacco product, uncomplicated: Secondary | ICD-10-CM | POA: Insufficient documentation

## 2024-05-29 DIAGNOSIS — N200 Calculus of kidney: Secondary | ICD-10-CM | POA: Diagnosis not present

## 2024-05-29 DIAGNOSIS — N201 Calculus of ureter: Secondary | ICD-10-CM | POA: Diagnosis present

## 2024-05-29 DIAGNOSIS — K219 Gastro-esophageal reflux disease without esophagitis: Secondary | ICD-10-CM | POA: Diagnosis not present

## 2024-05-29 HISTORY — PX: CYSTOSCOPY WITH RETROGRADE PYELOGRAM, URETEROSCOPY AND STENT PLACEMENT: SHX5789

## 2024-05-29 LAB — CBC
HCT: 37.8 % (ref 36.0–46.0)
Hemoglobin: 12.3 g/dL (ref 12.0–15.0)
MCH: 29.2 pg (ref 26.0–34.0)
MCHC: 32.5 g/dL (ref 30.0–36.0)
MCV: 89.8 fL (ref 80.0–100.0)
Platelets: 239 K/uL (ref 150–400)
RBC: 4.21 MIL/uL (ref 3.87–5.11)
RDW: 13.2 % (ref 11.5–15.5)
WBC: 10.9 K/uL — ABNORMAL HIGH (ref 4.0–10.5)
nRBC: 0 % (ref 0.0–0.2)

## 2024-05-29 SURGERY — CYSTOURETEROSCOPY, WITH RETROGRADE PYELOGRAM AND STENT INSERTION
Anesthesia: General | Site: Ureter | Laterality: Right

## 2024-05-29 MED ORDER — PROPOFOL 10 MG/ML IV BOLUS
INTRAVENOUS | Status: DC | PRN
  Administered 2024-05-29: 130 mg via INTRAVENOUS

## 2024-05-29 MED ORDER — PROPOFOL 10 MG/ML IV BOLUS
INTRAVENOUS | Status: AC
  Filled 2024-05-29: qty 20

## 2024-05-29 MED ORDER — IOHEXOL 300 MG/ML  SOLN
INTRAMUSCULAR | Status: DC | PRN
  Administered 2024-05-29: 11 mL

## 2024-05-29 MED ORDER — ONDANSETRON HCL 4 MG/2ML IJ SOLN
INTRAMUSCULAR | Status: AC
  Filled 2024-05-29: qty 2

## 2024-05-29 MED ORDER — ACETAMINOPHEN 500 MG PO TABS
1000.0000 mg | ORAL_TABLET | Freq: Once | ORAL | Status: AC
  Administered 2024-05-29: 1000 mg via ORAL

## 2024-05-29 MED ORDER — FENTANYL CITRATE (PF) 100 MCG/2ML IJ SOLN
INTRAMUSCULAR | Status: DC | PRN
  Administered 2024-05-29: 50 ug via INTRAVENOUS

## 2024-05-29 MED ORDER — MIDAZOLAM HCL 5 MG/5ML IJ SOLN
INTRAMUSCULAR | Status: DC | PRN
  Administered 2024-05-29: 2 mg via INTRAVENOUS

## 2024-05-29 MED ORDER — OXYCODONE HCL 5 MG/5ML PO SOLN: 5.0000 mg | Freq: Once | ORAL | Status: DC | PRN

## 2024-05-29 MED ORDER — OXYCODONE HCL 5 MG PO TABS: 5.0000 mg | ORAL_TABLET | Freq: Once | ORAL | Status: DC | PRN

## 2024-05-29 MED ORDER — ACETAMINOPHEN 500 MG PO TABS
ORAL_TABLET | ORAL | Status: AC
Start: 2024-05-29 — End: 2024-05-29
  Filled 2024-05-29: qty 2

## 2024-05-29 MED ORDER — LIDOCAINE HCL (PF) 2 % IJ SOLN
INTRAMUSCULAR | Status: DC | PRN
  Administered 2024-05-29: 100 mg via INTRADERMAL

## 2024-05-29 MED ORDER — AMISULPRIDE (ANTIEMETIC) 5 MG/2ML IV SOLN: 10.0000 mg | Freq: Once | INTRAVENOUS | Status: DC | PRN

## 2024-05-29 MED ORDER — CEFAZOLIN SODIUM-DEXTROSE 2-4 GM/100ML-% IV SOLN
2.0000 g | INTRAVENOUS | Status: AC
  Administered 2024-05-29: 2 g via INTRAVENOUS
  Filled 2024-05-29: qty 100

## 2024-05-29 MED ORDER — SODIUM CHLORIDE 0.9 % IR SOLN
Status: DC | PRN
  Administered 2024-05-29: 3000 mL

## 2024-05-29 MED ORDER — LACTATED RINGERS IV SOLN: INTRAVENOUS | Status: DC

## 2024-05-29 MED ORDER — LIDOCAINE HCL (PF) 2 % IJ SOLN
INTRAMUSCULAR | Status: AC
  Filled 2024-05-29: qty 5

## 2024-05-29 MED ORDER — MIDAZOLAM HCL 2 MG/2ML IJ SOLN
INTRAMUSCULAR | Status: AC
  Filled 2024-05-29: qty 2

## 2024-05-29 MED ORDER — ONDANSETRON HCL 4 MG/2ML IJ SOLN
INTRAMUSCULAR | Status: DC | PRN
  Administered 2024-05-29: 4 mg via INTRAVENOUS

## 2024-05-29 MED ORDER — DEXAMETHASONE SOD PHOSPHATE PF 10 MG/ML IJ SOLN
INTRAMUSCULAR | Status: DC | PRN
  Administered 2024-05-29: 10 mg via INTRAVENOUS

## 2024-05-29 MED ORDER — FENTANYL CITRATE (PF) 100 MCG/2ML IJ SOLN
INTRAMUSCULAR | Status: AC
  Filled 2024-05-29: qty 2

## 2024-05-29 MED ORDER — CHLORHEXIDINE GLUCONATE 0.12 % MT SOLN
15.0000 mL | Freq: Once | OROMUCOSAL | Status: AC
  Administered 2024-05-29: 15 mL via OROMUCOSAL

## 2024-05-29 MED ORDER — FENTANYL CITRATE (PF) 50 MCG/ML IJ SOSY: 25.0000 ug | PREFILLED_SYRINGE | INTRAMUSCULAR | Status: DC | PRN

## 2024-05-29 SURGICAL SUPPLY — 20 items
BAG COUNTER SPONGE SURGICOUNT (BAG) IMPLANT
BAG URO CATCHER STRL LF (MISCELLANEOUS) ×1 IMPLANT
BASKET ZERO TIP NITINOL 2.4FR (BASKET) IMPLANT
CATH URETL OPEN END 6FR 70 (CATHETERS) IMPLANT
CLOTH BEACON ORANGE TIMEOUT ST (SAFETY) ×1 IMPLANT
GLOVE SURG LX STRL 7.5 STRW (GLOVE) ×1 IMPLANT
GOWN STRL REUS W/ TWL LRG LVL3 (GOWN DISPOSABLE) IMPLANT
GOWN STRL REUS W/ TWL XL LVL3 (GOWN DISPOSABLE) ×1 IMPLANT
GUIDEWIRE STR DUAL SENSOR (WIRE) ×1 IMPLANT
GUIDEWIRE ZIPWRE .038 STRAIGHT (WIRE) IMPLANT
IV NS 1000ML BAXH (IV SOLUTION) ×1 IMPLANT
KIT TURNOVER KIT A (KITS) ×1 IMPLANT
MANIFOLD NEPTUNE II (INSTRUMENTS) ×1 IMPLANT
PACK CYSTO (CUSTOM PROCEDURE TRAY) ×1 IMPLANT
SHEATH NAVIGATOR HD 11/13X28 (SHEATH) IMPLANT
SHEATH NAVIGATOR HD 11/13X36 (SHEATH) IMPLANT
STENT URET 6FRX24 CONTOUR (STENTS) IMPLANT
TRACTIP FLEXIVA PULS ID 200XHI (Laser) IMPLANT
TUBING CONNECTING 10 (TUBING) ×1 IMPLANT
TUBING UROLOGY SET (TUBING) ×1 IMPLANT

## 2024-05-29 NOTE — H&P (Signed)
 Visit Note - May 29, 2024 Laurina, Fischl MRN: [id number] Phone: [phone] DOB: [birth year] Sex: Female PMS ID: [id number] DONNICE SIAD (Primary Provider) (Bill Under) Page 1 [phone] Work [phone] Fax [facility] [address] [place] [address] [place], [place] [address] Social History Single Question Alcohol  Screening: 3 days EtOH less than 1 drink per day Drug use IV Drug Use: No IV Drug Use Within Past 12 Months: No Smoking status - Current every day smoker Tobacco Use Tobacco Product: Other: Vape Medications bupropion  HCl 150 mg Oral - Tablet, Extended Release 24 hr bupropion  HCl 300 mg Oral - Tablet, Extended Release 24 hr Collagen Skin Renewal oral desvenlafaxine  succinate 100 mg Oral - tablet, extended release 24 hr Stool Softener 100 mg Oral - capsule Vitamins B Complex oral estradiol  0.1 mg/24 hr Transdermal - patch, transdermal weekly cholecalciferol  (vitamin D3) Allergies latex - Other Alerts Allergy to latex. No currently taking blood thinners. ROS Provider reviewed on May 29, 2024. A focused review of systems was performed including Cardiovascular, Constitutional / Symptom, Gastrointestinal (G.I.), Genitourinary (G.U.), Musculoskeletal, Psychiatric, and Respiratory and was notable for Back pain, Fever or chills, Nausea/Vomiting, and Depression. No Blood In Urine, No Pain Or Burning With Urination, No Urinary Incontinence, No Chest Pain, No Swelling In Legs, No Unintentional Weight Loss, No Abdominal Pain, And No Shortness Of Breath. Medical History Surgical History Date of last mammogram: 2025 Hysterectomy   Historical Summary: 1. Urolithiasis: - She was found to have gross hematuria and CT hematuria protocol 10/08/2023 revealed large staghorn stone occupying the entirety of the right collecting system and renal pelvis measuring 5.5 x 3.4 cm in caliber. There is no hydronephrosis. She had no left sided renal  stones. She was seen by Dr. Matilda and had cystoscopy that was unremarkable. -S/p staged R PCNL on 01/12/2024 and 01/15/2024. Stone analysis: 100% carbonate appetite. Postoperative CT 01/16/2024 with small residual calcification in the intrapole of the kidney. -Right ureteral stent removed on 02/02/2024. -Renal ultrasound 04/02/2024: A few small fragments, no obvious hydronephrosis. - 24-hour urine 02/2024: Urine volume only 990 mL, hypomagnesemia area, borderline high urine pH of 6.3. -Presents on 05/29/2024 with severe right-sided flank pain over the past 24 hours, associate with nausea and emesis. She denies fevers, chills, dysuria. -CT A/P 05/29/2024 with 7 mm distal right ureteral stone with proximal right hydronephrosis and 8 mm right interpolar stone. - She is having significant pain despite pain management and is requesting some placement. She has a past medical history of hyperlipidemia, depression. She denies cardiac or pulmonary history. She denies taking anticoagulation. Vitals: Date Taken By B.P. Pulse Resp. O2 Sat. Temp. Ht. Wt. BMI BSA 05/29/24 11:44 ANGEL, JILLIAN 149/82 SIT 116 98.7 F 0 0 FiO2 * Patient Reported Exam: Exam Appearance: well developed and nourished, in no acute distress Abdomen Palpation: soft, nontender to palpation Respiratory Effort: normal respiratory effort without labored breathing Heart Auscultation Exam: normal heart auscultation Data Reviewed:  1 Review of the result(s) of each unique test (Independent Review of CT Scan (images + report)), 3 Ordering of each unique test (Culture, Urine, Urinalysis (in-house) Automated, Order Radiographic Studies (CT Stone Protocol)), and 1 Independent interpretation of a test performed by another physician/other qualified health care professional (not separately reported) (Independent Review of CT Scan (images + report)) Tests Routine Tests Test: Urinalysis (in-house) Automated. A urinalysis was performed.  Results were reviewed and posted in the patient's chart. Test: Urinalysis, non-automated, with microscopy 1. PRELIMINARY Visit Note - May 29, 2024  Jeff, Mccallum MRN: [id number] Phone: [phone] DOB: 06/17/1976 Sex: Female PMS ID: [id number] DONNICE SIAD (Primary Provider) (Bill Under) Page 2 [phone] Work [phone] Fax [facility] [address] [place] [address] [place], [place] [address] Orders MIPS Order: MIPS Quality. Quality 134 (Screening for Clinical Depression and Follow-Up Plan): The patient was screened for depression and the screen was negative and no follow up required 1. Impression/Plan: 1. Urolithiasis: -CT A/P 05/29/2024 with 7 mm distal right ureteral stone with proximal right hydronephrosis and 8 mm right interpolar stone. -Will add-on for right ureteral stent placement today. Will arrange for next available ureteroscopy for treatment of her ureteral and renal stone. Kidney Stone ureter (N20.1) Status: Not At Treatment Goal Plan: Counseling for Urolithiasis. Please refer to the education handout for detailed counseling. After counseling the patient, we decided on the following plan for the RIGHT: Ureteroscopy Plan: Schedule Surgery. SURGERY SCHEDULING ORDER Procedure(s): Cystourethroscopy, with ureteroscopy and/or pyeloscopy; with lithotripsy including insertion of indwelling ureteral stent (47643) (laterality): RIGHT Diagnosis codes: Kidney Stone N20.1 Surgeon: SIAD. Anesthesia: general. Request anesthesia review prior to scheduling Complexity: minor. Risk Level: Low: Estimated Time: 75 minutes. Post-op follow up in: 30 days Preop Medications Required: Ancef  2 g - 2 g iv before surgery (prior to incision) Intraoperative Special Procedures Required: Laser - Laser will be required. Intraoperative Fluoroscopy - Intraoperative fluoroscopy will be required. Instructions: Follow-up with me in 1 month with renal ultrasound  prior Provider: DONNICE SIAD Priority: normal Plan: Order Tests. Labs:  CXURN - Culture, Urine Plan: Independent Review of CT Scan (images + report). Study Reviewed (images reviewed and interpreted): CT Stone protocol 1. PRELIMINARY Visit Note - May 29, 2024 Destinee, Taber MRN: [id number] Phone: [phone] DOB: Jul 30, 1976 Sex: Female PMS ID: [id number] DONNICE SIAD (Primary Provider) (Bill Under) Page [phone] Work [phone] Fax [facility] [address] [place] [address] [place], [place] [address] Plan: Order Radiographic Studies. Diagnosis: Kidney Stone - N20.1 CT Protocol(s): CT Stone Protocol (25823) Additional Instructions: STAT Indication: Kidney Stone Provider: SIAD DONNICE Perform at: [facility] In-House Priority: STAT Scheduled date: 05/29/2024 Plan: Performed CT Scan (in-house). Indication: Kidney Stone Study Performed: CT Stone protocol Prep and Positioning: no internal/external medical devices Plan: Schedule Surgery. SURGERY SCHEDULING ORDER Procedure(s): Cystourethroscopy, with insertion of indwelling ureteral stent (47667) (laterality): RIGHT Comments: Cystoscopy, right retrograde pyelogram, right stent placement to be done today 05/29/2024, 15 minutes with Dr. Renda Diagnosis codes: Kidney Stone N20.1 Target surgery date: Today. Surgeon: SIAD. Anesthesia: general. Request anesthesia review prior to scheduling Estimated Time: 15 minutes. Preop Medications Required: Ancef  2 g - 2 g iv before surgery (prior to incision) Provider: SIAD DONNICE Priority: Normal Scheduled date: 05/29/2024 Plan: Prescription. ondansetron  4 mg disintegrating tablet PO Sig: Take 1 tablet every 4 hours as needed Quantity: 30 Tablet Refills: 1 hydrocodone 5 mg-acetaminophen  325 mg tablet PO Sig: Take one or two pills every 6 hours as needed for severe pain. Quantity: 30 Tablet Earliest fill date: May 29, 2024 Plan: Medication  Injection. The risks of the medication were reviewed with the patient. An injection was given Therapeutic, prophylactic or diagnostic injection; subcutaneous or intramuscular (03627) Medication Administered: ketorolac (mg) Dose Administered: 30 mg NDC #: [id number] Lot number: [id number] Expiration date: 06/18/2025 PRELIMINARY Visit Note - May 29, 2024 Breely, Panik MRN: [id number] Phone: [phone] DOB: 10/22/1975 Sex: Female PMS ID: [id number] DONNICE SIAD (Primary Provider) (Bill Under) Page 4 [phone] Work [phone] Fax [facility] [phone]  [address] [place], [place] [address] Associated with: MIPS Quality Urinalysis (in-house) Automated Follow up PRN for: Surgery. Other Instructions: meet with surgical scheduler now Staff: MATTHEW GAY (Primary Provider) Cherylin Under) JILLIAN ANGEL

## 2024-05-29 NOTE — Transfer of Care (Signed)
 Immediate Anesthesia Transfer of Care Note  Patient: Helen Rice  Procedure(s) Performed: CYSTOURETEROSCOPY, WITH RETROGRADE PYELOGRAM AND STENT INSERTION (Right: Ureter)  Patient Location: PACU  Anesthesia Type:General  Level of Consciousness: sedated  Airway & Oxygen Therapy: Patient Spontanous Breathing and Patient connected to face mask oxygen  Post-op Assessment: Report given to RN and Post -op Vital signs reviewed and stable  Post vital signs: Reviewed and stable  Last Vitals:  Vitals Value Taken Time  BP 134/76 05/29/24 15:52  Temp    Pulse 95 05/29/24 15:53  Resp 14 05/29/24 15:53  SpO2 100 % 05/29/24 15:53  Vitals shown include unfiled device data.  Last Pain:  Vitals:   05/29/24 1335  TempSrc: Oral         Complications: No notable events documented.

## 2024-05-29 NOTE — Op Note (Signed)
 Preoperative diagnosis:  Right ureteral stone   Postoperative diagnosis:  Right ureteral stone   Procedure:  Cystoscopy Right ureteral stent placement (6 x 24 - no string)  Right retrograde pyelography with interpretation   Surgeon: Gretel CANDIE Renda Mickey. M.D.  Anesthesia: General  Complications: None  Intraoperative findings: Right retrograde pyelography was performed with a 6 Fr ureteral catheter.  This revealed a filling defect in the distal ureter consistent with the patient's known stone.  EBL: Minimal  Specimens: None  Indication: Helen Rice is a 48 y.o. patient with an obstructing right ureteral stone and uncontrolled pain. After reviewing the management options for treatment, he elected to proceed with the above surgical procedure(s). We have discussed the potential benefits and risks of the procedure, side effects of the proposed treatment, the likelihood of the patient achieving the goals of the procedure, and any potential problems that might occur during the procedure or recuperation. Informed consent has been obtained.  Description of procedure:  The patient was taken to the operating room and general anesthesia was induced.  The patient was placed in the dorsal lithotomy position, prepped and draped in the usual sterile fashion, and preoperative antibiotics were administered. A preoperative time-out was performed.   Cystourethroscopy was performed.  The patient's urethra was examined and was normal. The bladder was then systematically examined in its entirety. There was no evidence for any bladder tumors, stones, or other mucosal pathology.    Attention then turned to the right ureteral orifice and a ureteral catheter was used to intubate the ureteral orifice.  Omnipaque  contrast was injected through the ureteral catheter and a retrograde pyelogram was performed with findings as dictated above.  A 0.38 sensor guidewire was then advanced up the right ureter into the  renal pelvis under fluoroscopic guidance.  The wire was then backloaded through the cystoscope and a ureteral stent was advance over the wire using Seldinger technique.  The stent was positioned appropriately under fluoroscopic and cystoscopic guidance.  The wire was then removed with an adequate stent curl noted in the renal pelvis as well as in the bladder.  The bladder was then emptied and the procedure ended.  The patient appeared to tolerate the procedure well and without complications.  The patient was able to be awakened and transferred to the recovery unit in satisfactory condition.    Gretel CANDIE Renda Teddie MD

## 2024-05-29 NOTE — Discharge Instructions (Addendum)

## 2024-05-29 NOTE — Anesthesia Procedure Notes (Signed)
 Procedure Name: LMA Insertion Date/Time: 05/29/2024 3:21 PM  Performed by: Carleton Garnette SAUNDERS, CRNAPre-anesthesia Checklist: Emergency Drugs available, Patient identified, Suction available, Patient being monitored and Timeout performed Patient Re-evaluated:Patient Re-evaluated prior to induction Oxygen Delivery Method: Circle system utilized Preoxygenation: Pre-oxygenation with 100% oxygen Induction Type: IV induction LMA: LMA inserted LMA Size: 4.0 Tube type: Oral Number of attempts: 1 Placement Confirmation: positive ETCO2 and breath sounds checked- equal and bilateral Tube secured with: Tape Dental Injury: Teeth and Oropharynx as per pre-operative assessment

## 2024-05-29 NOTE — Anesthesia Preprocedure Evaluation (Addendum)
 Anesthesia Evaluation  Patient identified by MRN, date of birth, ID band Patient awake    Reviewed: Allergy & Precautions, H&P , NPO status , Patient's Chart, lab work & pertinent test results  History of Anesthesia Complications (+) DIFFICULT AIRWAY and history of anesthetic complications  Airway Mallampati: III  TM Distance: >3 FB Neck ROM: Full    Dental no notable dental hx. (+) Teeth Intact, Dental Advisory Given   Pulmonary Current SmokerPatient did not abstain from smoking., former smoker   Pulmonary exam normal breath sounds clear to auscultation       Cardiovascular negative cardio ROS Normal cardiovascular exam Rhythm:Regular Rate:Normal     Neuro/Psych  Headaches  Anxiety Depression       GI/Hepatic Neg liver ROS,GERD  ,,  Endo/Other  negative endocrine ROS    Renal/GU Renal disease  negative genitourinary   Musculoskeletal   Abdominal   Peds  Hematology  (+) Blood dyscrasia, anemia Lab Results      Component                Value               Date                      WBC                      7.7                 01/16/2024                HGB                      10.2 (L)            01/16/2024                HCT                      30.9 (L)            01/16/2024                MCV                      92.2                01/16/2024                PLT                      248                 01/16/2024              Anesthesia Other Findings   Reproductive/Obstetrics negative OB ROS                              Anesthesia Physical Anesthesia Plan  ASA: 2  Anesthesia Plan: General   Post-op Pain Management: Tylenol  PO (pre-op)*   Induction: Intravenous  PONV Risk Score and Plan: 4 or greater and Ondansetron , Dexamethasone  and Midazolam   Airway Management Planned: LMA  Additional Equipment:   Intra-op Plan:   Post-operative Plan: Extubation in OR  Informed  Consent: I have reviewed the patients History and Physical, chart, labs and discussed the procedure including  the risks, benefits and alternatives for the proposed anesthesia with the patient or authorized representative who has indicated his/her understanding and acceptance.     Dental advisory given  Plan Discussed with: CRNA  Anesthesia Plan Comments:         Anesthesia Quick Evaluation

## 2024-05-29 NOTE — Interval H&P Note (Signed)
 History and Physical Interval Note:  05/29/2024 3:12 PM  Helen Rice  has presented today for surgery, with the diagnosis of RIGHT KIDNEY STONE.  The various methods of treatment have been discussed with the patient and family. After consideration of risks, benefits and other options for treatment, the patient has consented to  Procedure(s): CYSTOURETEROSCOPY, WITH RETROGRADE PYELOGRAM AND STENT INSERTION (Right) as a surgical intervention.  The patient's history has been reviewed, patient examined, no change in status, stable for surgery.  I have reviewed the patient's chart and labs.  Questions were answered to the patient's satisfaction.     [employer]

## 2024-05-30 ENCOUNTER — Other Ambulatory Visit: Payer: Self-pay | Admitting: Urology

## 2024-05-30 ENCOUNTER — Encounter (HOSPITAL_COMMUNITY): Payer: Self-pay | Admitting: Urology

## 2024-05-30 NOTE — Anesthesia Postprocedure Evaluation (Signed)
 Anesthesia Post Note  Patient: Howard A Stradford  Procedure(s) Performed: CYSTOURETEROSCOPY, WITH RETROGRADE PYELOGRAM AND STENT INSERTION (Right: Ureter)     Patient location during evaluation: PACU Anesthesia Type: General Level of consciousness: awake and alert Pain management: pain level controlled Vital Signs Assessment: post-procedure vital signs reviewed and stable Respiratory status: spontaneous breathing, nonlabored ventilation, respiratory function stable and patient connected to nasal cannula oxygen Cardiovascular status: blood pressure returned to baseline and stable Postop Assessment: no apparent nausea or vomiting Anesthetic complications: no   No notable events documented.  Last Vitals:  Vitals:   05/29/24 1645 05/29/24 1656  BP: 120/70 131/70  Pulse: 90 88  Resp: 17 16  Temp: 37.2 C   SpO2: 96% 100%    Last Pain:  Vitals:   05/29/24 1656  TempSrc:   PainSc: 0-No pain                 Garnette DELENA Gab

## 2024-05-31 ENCOUNTER — Encounter: Payer: Self-pay | Admitting: Family Medicine

## 2024-05-31 ENCOUNTER — Ambulatory Visit (INDEPENDENT_AMBULATORY_CARE_PROVIDER_SITE_OTHER): Admitting: Family Medicine

## 2024-05-31 ENCOUNTER — Other Ambulatory Visit: Payer: Self-pay | Admitting: Family Medicine

## 2024-05-31 VITALS — BP 133/81 | HR 95 | Temp 98.1°F | Ht 62.0 in | Wt 140.5 lb

## 2024-05-31 DIAGNOSIS — R5382 Chronic fatigue, unspecified: Secondary | ICD-10-CM | POA: Diagnosis not present

## 2024-05-31 DIAGNOSIS — N200 Calculus of kidney: Secondary | ICD-10-CM

## 2024-05-31 DIAGNOSIS — Z789 Other specified health status: Secondary | ICD-10-CM | POA: Diagnosis not present

## 2024-05-31 DIAGNOSIS — R4189 Other symptoms and signs involving cognitive functions and awareness: Secondary | ICD-10-CM

## 2024-05-31 DIAGNOSIS — Z90710 Acquired absence of both cervix and uterus: Secondary | ICD-10-CM

## 2024-05-31 DIAGNOSIS — R41840 Attention and concentration deficit: Secondary | ICD-10-CM | POA: Diagnosis not present

## 2024-05-31 NOTE — Progress Notes (Signed)
 Subjective: CC: Brain fog PCP: Jolinda Helen HERO, DO YEP:Helen Rice is a 48 y.o. female presenting to clinic today for:  Patient reports that she is been having extremely low energy, foggy headedness and difficulty concentrating.  Sometimes she has been getting shaking her hands.  She stopped a lot of her supplements and protein powders but her symptoms never improved so she wants to get some labs done.  She reports that her urologist told her that sometimes she could feel fatigued from a kidney stone but she should start feeling better within the couple of months but she has that she has gotten worse. She was actually found to have a staghorn calculus which she has undergone stent placement for.  She notes immediate improvement in many of her symptoms following that placement.  She is going to have surgical intervention soon to remove stone and stent.  She does not wish to proceed with any of the labs she requested since she is symptomatically better for the most part  Still has nonrestorative sleep where she has excessive daytime sedation despite adequate hours of sleep.  Has been offered sleep study in the past but for some reason this never got coordinated so she is willing to undergo it now as she has met her deductible.  Reports poor concentration as well   ROS: Per HPI  Allergies  Allergen Reactions   Influenza Vaccines     Sweating and chills    Elemental Sulfur Rash   Latex Rash   Past Medical History:  Diagnosis Date   Anemia    hx of   Anxiety    Depression    Difficult intubation    GERD (gastroesophageal reflux disease) 05/30/2017   History of kidney stones    Lyme disease    being seen by [facility]   Pneumonia    Psoriasis     Current Outpatient Medications:    buPROPion  (WELLBUTRIN  XL) 300 MG 24 hr tablet, Take 1 tablet (300 mg total) by mouth in the morning., Disp: 90 tablet, Rfl: 1   cephALEXin  (KEFLEX ) 500 MG capsule, 1 p.o. every 12  hours x 5 days, then 1 p.o. daily until you see a specialist in [place], Disp: 40 capsule, Rfl: 1   conjugated estrogens  (PREMARIN ) vaginal cream, Place 1 Applicatorful vaginally daily. (Patient taking differently: Place 1 Applicatorful vaginally every 3 (three) days.), Disp: 42.5 g, Rfl: 12   desvenlafaxine  (PRISTIQ ) 100 MG 24 hr tablet, Take 1 tablet (100 mg total) by mouth daily. (Patient taking differently: Take 100 mg by mouth at bedtime.), Disp: 90 tablet, Rfl: 3   docusate sodium  (COLACE) 100 MG capsule, Take 1 capsule (100 mg total) by mouth daily as needed for up to 30 doses., Disp: 30 capsule, Rfl: 0   estradiol  (CLIMARA  - DOSED IN MG/24 HR) 0.1 mg/24hr patch, APPLY 1 PATCH TOPICALLY ONCE A WEEK, Disp: 12 patch, Rfl: 3   hydrOXYzine  (ATARAX ) 25 MG tablet, Take 0.5-1 tablets (12.5-25 mg total) by mouth every 8 (eight) hours as needed for anxiety., Disp: 90 tablet, Rfl: 1   oxyCODONE -acetaminophen  (PERCOCET) 5-325 MG tablet, Take 1 tablet by mouth every 4 (four) hours as needed for up to 18 doses for severe pain (pain score 7-10)., Disp: 18 tablet, Rfl: 0 Social History   Socioeconomic History   Marital status: Married    Spouse name: Not on file   Number of children: 1   Years of education: GED   Highest education level:  Some college, no degree  Occupational History   Occupation: [employer]  Tobacco Use   Smoking status: Former    Current packs/day: 0.00    Average packs/day: 1.5 packs/day for 15.0 years (22.5 ttl pk-yrs)    Types: Cigarettes    Start date: 2001    Quit date: 2016    Years since quitting: 9.8   Smokeless tobacco: Never  Vaping Use   Vaping status: Every Day   Substances: Nicotine  Substance and Sexual Activity   Alcohol  use: Yes    Comment: occ   Drug use: No   Sexual activity: Yes    Birth control/protection: Surgical  Other Topics Concern   Not on file  Social History Narrative   Right handed    Caffeine use: Drinks 1 cup coffee per day   1-3 sodas per  day   Lives with husband   Social Drivers of Corporate investment banker Strain: Low Risk  (05/30/2024)   Overall Financial Resource Strain (CARDIA)    Difficulty of Paying Living Expenses: Not very hard  Food Insecurity: No Food Insecurity (05/30/2024)   Hunger Vital Sign    Worried About Running Out of Food in the Last Year: Never true    Ran Out of Food in the Last Year: Never true  Transportation Needs: No Transportation Needs (05/30/2024)   PRAPARE - Administrator, Civil Service (Medical): No    Lack of Transportation (Non-Medical): No  Physical Activity: Insufficiently Active (05/30/2024)   Exercise Vital Sign    Days of Exercise per Week: 3 days    Minutes of Exercise per Session: 10 min  Stress: No Stress Concern Present (05/30/2024)   [employer] of Occupational Health - Occupational Stress Questionnaire    Feeling of Stress: Only a little  Social Connections: Moderately Isolated (05/30/2024)   Social Connection and Isolation Panel    Frequency of Communication with Friends and Family: More than three times a week    Frequency of Social Gatherings with Friends and Family: Once a week    Attends Religious Services: Never    Database administrator or Organizations: No    Attends Engineer, structural: Not on file    Marital Status: Married  Catering manager Violence: Not At Risk (01/12/2024)   Humiliation, Afraid, Rape, and Kick questionnaire    Fear of Current or Ex-Partner: No    Emotionally Abused: No    Physically Abused: No    Sexually Abused: No   Family History  Problem Relation Age of Onset   Cancer Father    Psoriasis Father    Autoimmune disease Father    Breast cancer Sister    Heart attack Brother 75       had a second MI which required quad bypass 83yr   Heart attack Brother 46       MI w/ stent   Thyroid  disease Maternal Aunt    Heart disease Maternal Grandmother    Heart disease Maternal Grandfather    Ovarian cancer  Paternal Grandmother 53   Ovarian cancer Paternal Grandfather     Objective: Office vital signs reviewed. BP 133/81   Pulse 95   Temp 98.1 F (36.7 C)   Ht 5' 2 (1.575 m)   Wt 140 lb 8 oz (63.7 kg)   SpO2 96%   BMI 25.70 kg/m   Physical Examination:  General: Awake, alert, nontoxic female, No acute distress HEENT: Sclera white.  Moist mucous membranes.  Slight retrognathia/micrognathia appreciated Cardio: regular rate and rhythm, S1S2 heard, no murmurs appreciated Pulm: clear to auscultation bilaterally, no wheezes, rhonchi or rales; normal work of breathing on room air  Assessment/ Plan: 48 y.o. female   Brain fog - Plan: Ambulatory referral to Sleep Studies  Chronic fatigue - Plan: Ambulatory referral to Sleep Studies  Difficulty concentrating  Hepatitis B vaccination status unknown - Plan: Hepatitis B surface antibody,quantitative  Staghorn calculus   She certainly has the anatomy for an obstructive sleep apnea from a retrognathia/micrognathia standpoint.  She also carries extra skin in the neck area.  Referral for home sleep study placed.  Hopefully if she is found to have obstructive sleep apnea simply treating this will resolve many of her issues.  Would gladly trial Strattera however if she still has concentration issues going forward  Will collect hepatitis B antibodies to look for need for vaccination  Keep appointments with urology for kidney   Helen CHRISTELLA Fielding, DO [facility] [phone]

## 2024-06-01 LAB — HEPATITIS B SURFACE ANTIBODY, QUANTITATIVE: Hepatitis B Surf Ab Quant: 2584 m[IU]/mL

## 2024-06-03 ENCOUNTER — Ambulatory Visit: Payer: Self-pay | Admitting: Family Medicine

## 2024-06-05 NOTE — Progress Notes (Signed)
 COVID Vaccine received:  []  No [x]  Yes Date of any COVID positive Test in last 90 days:  PCP - Norene Fielding, DO  Cardiologist - no  Chest x-ray - 01-12-2024  1v  Epic EKG - 2019   Stress Test -  ECHO - 2017 Epic Cardiac Cath -  CT Coronary Calcium score: 0 on  11-16-2021 Epic  Bowel Prep - []  No  []   Yes ______  Pacemaker / ICD device []  No []  Yes   Spinal Cord Stimulator:[]  No []  Yes       History of Sleep Apnea? []  No []  Yes   CPAP used?- []  No []  Yes    Patient has: []  NO Hx DM   []  Pre-DM   []  DM1  []   DM2 Does the patient monitor blood sugar?   []  N/A   []  No []  Yes  Last A1c was:        on      Does patient have a [employer] or Dexcom? []  No []  Yes   Fasting Blood Sugar Ranges-  Checks Blood Sugar _____ times a day  Blood Thinner / Instructions: Aspirin Instructions:  Dental hx: []  Dentures:  []  N/A      []  Bridge or Partial:                   []  Loose or Damaged teeth:   Comments:   Activity level: Able to walk up 2 flights of stairs without becoming significantly short of breath or having chest pain?  []  No   []    Yes  Patient can perform ADLs without assistance. []  No   []   Yes  Anesthesia review:   Patient denies any S&S of respiratory illness or Covid - no shortness of breath, fever, cough or chest pain at PAT appointment.  Patient verbalized understanding and agreement to the Pre-Surgical Instructions that were given to them at this PAT appointment. Patient was also educated of the need to review these PAT instructions again prior to his/her surgery.I reviewed the appropriate phone numbers to call if they have any and questions or concerns.

## 2024-06-06 ENCOUNTER — Other Ambulatory Visit: Payer: Self-pay

## 2024-06-06 ENCOUNTER — Encounter (HOSPITAL_COMMUNITY)
Admission: RE | Admit: 2024-06-06 | Discharge: 2024-06-06 | Disposition: A | Discharge status: Home / Self Care | Source: Ambulatory Visit | Attending: Urology | Admitting: Urology

## 2024-06-06 ENCOUNTER — Encounter (HOSPITAL_COMMUNITY): Payer: Self-pay

## 2024-06-06 VITALS — BP 135/78 | HR 80 | Temp 98.6°F | Resp 16 | Ht 62.0 in | Wt 136.0 lb

## 2024-06-06 DIAGNOSIS — I700 Atherosclerosis of aorta: Secondary | ICD-10-CM | POA: Insufficient documentation

## 2024-06-06 DIAGNOSIS — Z01818 Encounter for other preprocedural examination: Secondary | ICD-10-CM | POA: Insufficient documentation

## 2024-06-06 HISTORY — DX: Unspecified osteoarthritis, unspecified site: M19.90

## 2024-06-06 HISTORY — DX: Essential (primary) hypertension: I10

## 2024-06-06 LAB — BASIC METABOLIC PANEL WITH GFR
Anion gap: 10 (ref 5–15)
BUN: 16 mg/dL (ref 6–20)
CO2: 28 mmol/L (ref 22–32)
Calcium: 10.2 mg/dL (ref 8.9–10.3)
Chloride: 104 mmol/L (ref 98–111)
Creatinine, Ser: 0.92 mg/dL (ref 0.44–1.00)
GFR, Estimated: >60 mL/min (ref >60)
Glucose, Bld: 95 mg/dL (ref 70–99)
Potassium: 4 mmol/L (ref 3.5–5.1)
Sodium: 142 mmol/L (ref 135–145)

## 2024-06-06 LAB — CBC
HCT: 39.2 % (ref 36.0–46.0)
Hemoglobin: 12.6 g/dL (ref 12.0–15.0)
MCH: 29.3 pg (ref 26.0–34.0)
MCHC: 32.1 g/dL (ref 30.0–36.0)
MCV: 91.2 fL (ref 80.0–100.0)
Platelets: 352 K/uL (ref 150–400)
RBC: 4.3 MIL/uL (ref 3.87–5.11)
RDW: 12.9 % (ref 11.5–15.5)
WBC: 6.5 K/uL (ref 4.0–10.5)
nRBC: 0 % (ref 0.0–0.2)

## 2024-06-06 NOTE — Patient Instructions (Signed)
 SURGICAL WAITING ROOM VISITATION Patients having surgery or a procedure may have no more than 2 support people in the waiting area - these visitors may rotate in the visitor waiting room.   If the patient needs to stay at the hospital during part of their recovery, the visitor guidelines for inpatient rooms apply.  PRE-OP VISITATION  Pre-op nurse will coordinate an appropriate time for 1 support person to accompany the patient in pre-op.  This support person may not rotate.  This visitor will be contacted when the time is appropriate for the visitor to come back in the pre-op area.  Please refer to the [facility] website for the visitor guidelines for Inpatients (after your surgery is over and you are in a regular room).  You are not required to quarantine at this time prior to your surgery. However, you must do this: Hand Hygiene often Do NOT share personal items Notify your provider if you are in close contact with someone who has COVID or you develop fever 100.4 or greater, new onset of sneezing, cough, sore throat, shortness of breath or body aches.  If you test positive for Covid or have been in contact with anyone that has tested positive in the last 10 days please notify you surgeon.    Your procedure is scheduled on:  FRIDAY  06-07-24  Report to [facility] Main Entrance: [address] entrance where the [employer] is available.   Report to admitting at:  09:45   AM  Call this number if you have any questions or problems the morning of surgery [phone]  DO NOT EAT OR DRINK ANYTHING AFTER MIDNIGHT THE NIGHT PRIOR TO YOUR SURGERY / PROCEDURE.   FOLLOW  ANY ADDITIONAL PRE OP INSTRUCTIONS YOU RECEIVED FROM YOUR SURGEON'S OFFICE!!!   Oral Hygiene is also important to reduce your risk of infection.        Remember - BRUSH YOUR TEETH THE MORNING OF SURGERY WITH YOUR REGULAR TOOTHPASTE  Do NOT smoke after Midnight the night before surgery.  STOP TAKING all Vitamins, Herbs  and supplements 1 week before your surgery.   Take ONLY these medicines the morning of surgery with A SIP OF WATER: Bupropion , and EITHER Tylenol  OR Hydrocodone APAP if needed for pain.  You may use your Eye Drops if needed.                     You may not have any metal on your body including hair pins, jewelry, and body piercing  Do not wear make-up, lotions, powders, perfumes  or deodorant  Do not wear nail polish including gel and S&S, artificial / acrylic nails, or any other type of covering on natural nails including finger and toenails. If you have artificial nails, gel coating, etc., that needs to be removed by a nail salon, Please have this removed prior to surgery. Not doing so may mean that your surgery could be cancelled or delayed if the Surgeon or anesthesia staff feels like they are unable to monitor you safely.   Do not shave 48 hours prior to surgery to avoid nicks in your skin which may contribute to postoperative infections.    Contacts, Hearing Aids, dentures or bridgework may not be worn into surgery. DENTURES WILL BE REMOVED PRIOR TO SURGERY PLEASE DO NOT APPLY Poly grip OR ADHESIVES!!!  Patients discharged on the day of surgery will not be allowed to drive home.  Someone NEEDS to stay with you for the first  24 hours after anesthesia.  Do not bring your home medications to the hospital. The Pharmacy will dispense medications listed on your medication list to you during your admission in the Hospital.  Special Instructions: Bring a copy of your healthcare power of attorney and living will documents the day of surgery, if you wish to have them scanned into your [facility] Records- EPIC  Please read over the following fact sheets you were given: IF YOU HAVE QUESTIONS ABOUT YOUR PRE-OP INSTRUCTIONS, PLEASE CALL [phone].   [place] - Preparing for Surgery      Before surgery, you can play an important role.  Because skin is not sterile, your skin needs to  be as free of germs as possible.  You can reduce the number of germs on your skin by washing with CHG (chlorahexidine gluconate) soap before surgery.  CHG is an antiseptic cleaner which kills germs and bonds with the skin to continue killing germs even after washing. Please DO NOT use if you have an allergy to CHG or antibacterial soaps.  If your skin becomes reddened/irritated stop using the CHG and inform your nurse when you arrive at Short Stay. Do not shave (including legs and underarms) for at least 48 hours prior to the first CHG shower.  You may shave your face/neck.  Please follow these instructions carefully:  1.  Shower with CHG Soap the night before surgery ONLY (DO NOT USE THE CHG SOAP THE MORNING OF SURGERY).  2.  If you choose to wash your hair, wash your hair first as usual with your normal  shampoo.  3.  After you shampoo, rinse your hair and body thoroughly to remove the shampoo.                             4.  Use CHG as you would any other liquid soap.  You can apply chg directly to the skin and wash.  Gently with a scrungie or clean washcloth.  5.  Apply the CHG Soap to your body ONLY FROM THE NECK DOWN.   Do not use on face/ open                           Wound or open sores. Avoid contact with eyes, ears mouth and genitals (private parts).                       Wash face,  Genitals (private parts) with your normal soap.             6.  Wash thoroughly, paying special attention to the area where your  surgery  will be performed.  7.  Thoroughly rinse your body with warm water from the neck down.  8.  DO NOT shower/wash with your normal soap after using and rinsing off the CHG Soap.                9.  Pat yourself dry with a clean towel.            10.  Wear clean pajamas.            11.  Place clean sheets on your bed the night of your first shower and do not  sleep with pets.  Day of Surgery : Do not apply any CHG, lotions/deodorants the morning of surgery.  Please wear  clean clothes to the  hospital/surgery center.   FAILURE TO FOLLOW THESE INSTRUCTIONS MAY RESULT IN THE CANCELLATION OF YOUR SURGERY  PATIENT SIGNATURE_________________________________  NURSE SIGNATURE__________________________________  ________________________________________________________________________

## 2024-06-07 ENCOUNTER — Ambulatory Visit (HOSPITAL_COMMUNITY): Admitting: Certified Registered"

## 2024-06-07 ENCOUNTER — Encounter (HOSPITAL_COMMUNITY): Payer: Self-pay | Admitting: Urology

## 2024-06-07 ENCOUNTER — Encounter (HOSPITAL_COMMUNITY)
Admission: RE | Disposition: A | Discharge status: Home / Self Care | Payer: Self-pay | Source: Home / Self Care | Attending: Urology

## 2024-06-07 ENCOUNTER — Ambulatory Visit (HOSPITAL_COMMUNITY)
Admission: RE | Admit: 2024-06-07 | Discharge: 2024-06-07 | Disposition: A | Discharge status: Home / Self Care | Attending: Urology | Admitting: Urology

## 2024-06-07 ENCOUNTER — Ambulatory Visit (HOSPITAL_COMMUNITY)

## 2024-06-07 DIAGNOSIS — N202 Calculus of kidney with calculus of ureter: Secondary | ICD-10-CM

## 2024-06-07 DIAGNOSIS — E785 Hyperlipidemia, unspecified: Secondary | ICD-10-CM | POA: Diagnosis not present

## 2024-06-07 DIAGNOSIS — N132 Hydronephrosis with renal and ureteral calculous obstruction: Secondary | ICD-10-CM | POA: Insufficient documentation

## 2024-06-07 DIAGNOSIS — F32A Depression, unspecified: Secondary | ICD-10-CM | POA: Diagnosis not present

## 2024-06-07 DIAGNOSIS — N201 Calculus of ureter: Secondary | ICD-10-CM | POA: Diagnosis present

## 2024-06-07 HISTORY — PX: CYSTOSCOPY/URETEROSCOPY/HOLMIUM LASER/STENT PLACEMENT: SHX6546

## 2024-06-07 SURGERY — CYSTOSCOPY/URETEROSCOPY/HOLMIUM LASER/STENT PLACEMENT
Anesthesia: General | Laterality: Right

## 2024-06-07 MED ORDER — AMISULPRIDE (ANTIEMETIC) 5 MG/2ML IV SOLN: 10.0000 mg | Freq: Once | INTRAVENOUS | Status: DC | PRN

## 2024-06-07 MED ORDER — IOHEXOL 300 MG/ML  SOLN
INTRAMUSCULAR | Status: DC | PRN
  Administered 2024-06-07: 10 mL

## 2024-06-07 MED ORDER — ORAL CARE MOUTH RINSE: 15.0000 mL | Freq: Once | OROMUCOSAL | Status: AC

## 2024-06-07 MED ORDER — DEXAMETHASONE SODIUM PHOSPHATE 4 MG/ML IJ SOLN
INTRAMUSCULAR | Status: DC | PRN
  Administered 2024-06-07: 5 mg via INTRAVENOUS

## 2024-06-07 MED ORDER — CHLORHEXIDINE GLUCONATE 0.12 % MT SOLN
15.0000 mL | Freq: Once | OROMUCOSAL | Status: AC
  Administered 2024-06-07: 15 mL via OROMUCOSAL

## 2024-06-07 MED ORDER — MIDAZOLAM HCL (PF) 2 MG/2ML IJ SOLN
INTRAMUSCULAR | Status: DC | PRN
  Administered 2024-06-07: 2 mg via INTRAVENOUS

## 2024-06-07 MED ORDER — ONDANSETRON HCL 4 MG/2ML IJ SOLN
INTRAMUSCULAR | Status: DC | PRN
Start: 2024-06-07 — End: 2024-06-07
  Administered 2024-06-07: 4 mg via INTRAVENOUS

## 2024-06-07 MED ORDER — FENTANYL CITRATE (PF) 100 MCG/2ML IJ SOLN
INTRAMUSCULAR | Status: DC | PRN
  Administered 2024-06-07: 50 ug via INTRAVENOUS
  Administered 2024-06-07 (×2): 25 ug via INTRAVENOUS

## 2024-06-07 MED ORDER — CEFAZOLIN SODIUM-DEXTROSE 2-4 GM/100ML-% IV SOLN
2.0000 g | INTRAVENOUS | Status: AC
  Administered 2024-06-07: 2 g via INTRAVENOUS
  Filled 2024-06-07: qty 100

## 2024-06-07 MED ORDER — OXYCODONE-ACETAMINOPHEN 5-325 MG PO TABS: 1.0000 | ORAL_TABLET | ORAL | 0 refills | Status: AC | PRN

## 2024-06-07 MED ORDER — SODIUM CHLORIDE 0.9 % IR SOLN
Status: DC | PRN
  Administered 2024-06-07: 3000 mL

## 2024-06-07 MED ORDER — LACTATED RINGERS IV SOLN: INTRAVENOUS | Status: DC

## 2024-06-07 MED ORDER — FENTANYL CITRATE (PF) 50 MCG/ML IJ SOSY: 25.0000 ug | PREFILLED_SYRINGE | INTRAMUSCULAR | Status: DC | PRN

## 2024-06-07 MED ORDER — KETOROLAC TROMETHAMINE 30 MG/ML IJ SOLN
INTRAMUSCULAR | Status: DC | PRN
  Administered 2024-06-07: 30 mg via INTRAVENOUS

## 2024-06-07 MED ORDER — FENTANYL CITRATE (PF) 100 MCG/2ML IJ SOLN
INTRAMUSCULAR | Status: AC
  Filled 2024-06-07: qty 2

## 2024-06-07 MED ORDER — LIDOCAINE HCL (CARDIAC) PF 100 MG/5ML IV SOSY
PREFILLED_SYRINGE | INTRAVENOUS | Status: DC | PRN
  Administered 2024-06-07: 50 mg via INTRATRACHEAL

## 2024-06-07 MED ORDER — PROPOFOL 10 MG/ML IV BOLUS
INTRAVENOUS | Status: AC
  Filled 2024-06-07: qty 20

## 2024-06-07 MED ORDER — ACETAMINOPHEN 500 MG PO TABS: 1000.0000 mg | ORAL_TABLET | Freq: Once | ORAL | Status: DC

## 2024-06-07 MED ORDER — ONDANSETRON HCL 4 MG/2ML IJ SOLN
INTRAMUSCULAR | Status: AC
  Filled 2024-06-07: qty 2

## 2024-06-07 MED ORDER — OXYCODONE HCL 5 MG PO TABS: 5.0000 mg | ORAL_TABLET | Freq: Once | ORAL | Status: DC | PRN

## 2024-06-07 MED ORDER — OXYCODONE HCL 5 MG/5ML PO SOLN: 5.0000 mg | Freq: Once | ORAL | Status: DC | PRN

## 2024-06-07 MED ORDER — MIDAZOLAM HCL 2 MG/2ML IJ SOLN
INTRAMUSCULAR | Status: AC
  Filled 2024-06-07: qty 2

## 2024-06-07 MED ORDER — PROPOFOL 10 MG/ML IV BOLUS
INTRAVENOUS | Status: DC | PRN
  Administered 2024-06-07: 150 mg via INTRAVENOUS

## 2024-06-07 SURGICAL SUPPLY — 22 items
BAG URO CATCHER STRL LF (MISCELLANEOUS) ×1 IMPLANT
BASKET ZERO TIP NITINOL 2.4FR (BASKET) IMPLANT
BENZOIN TINCTURE PRP APPL 2/3 (GAUZE/BANDAGES/DRESSINGS) IMPLANT
CATH URETERAL DUAL LUMEN 10F (MISCELLANEOUS) IMPLANT
CATH URETL OPEN END 6FR 70 (CATHETERS) IMPLANT
CLOTH BEACON ORANGE TIMEOUT ST (SAFETY) ×1 IMPLANT
DRSG TEGADERM 2-3/8X2-3/4 SM (GAUZE/BANDAGES/DRESSINGS) IMPLANT
FIBER LASER MOSES 200 DFL (Laser) IMPLANT
GLOVE BIOGEL M 7.0 STRL (GLOVE) ×1 IMPLANT
GOWN STRL REUS W/ TWL XL LVL3 (GOWN DISPOSABLE) ×1 IMPLANT
GUIDEWIRE STR DUAL SENSOR (WIRE) ×2 IMPLANT
GUIDEWIRE ZIPWRE .038 STRAIGHT (WIRE) IMPLANT
KIT TURNOVER KIT A (KITS) ×1 IMPLANT
MANIFOLD NEPTUNE II (INSTRUMENTS) ×1 IMPLANT
PACK CYSTO (CUSTOM PROCEDURE TRAY) ×1 IMPLANT
PAD PREP 24X48 CUFFED NSTRL (MISCELLANEOUS) ×1 IMPLANT
SHEATH DILATOR SET 8/10 (MISCELLANEOUS) IMPLANT
SHEATH NAVIGATOR HD 11/13X28 (SHEATH) IMPLANT
SHEATH NAVIGATOR HD 11/13X36 (SHEATH) IMPLANT
STENT URET 6FRX24 CONTOUR (STENTS) IMPLANT
TUBING CONNECTING 10 (TUBING) ×1 IMPLANT
TUBING UROLOGY SET (TUBING) ×1 IMPLANT

## 2024-06-07 NOTE — Anesthesia Procedure Notes (Signed)
 Procedure Name: LMA Insertion Date/Time: 06/07/2024 11:10 AM  Performed by: Judythe Tanda Aran, CRNAPre-anesthesia Checklist: Emergency Drugs available, Patient identified, Suction available and Patient being monitored Patient Re-evaluated:Patient Re-evaluated prior to induction Oxygen Delivery Method: Circle system utilized Preoxygenation: Pre-oxygenation with 100% oxygen Induction Type: IV induction Ventilation: Mask ventilation without difficulty LMA: LMA inserted and LMA with gastric port inserted LMA Size: 4.0 Number of attempts: 1 Placement Confirmation: positive ETCO2 and breath sounds checked- equal and bilateral Tube secured with: Tape Dental Injury: Teeth and Oropharynx as per pre-operative assessment

## 2024-06-07 NOTE — Anesthesia Postprocedure Evaluation (Signed)
 Anesthesia Post Note  Patient: Lyza A Wilbon  Procedure(s) Performed: CYSTOSCOPY/URETEROSCOPY/HOLMIUM LASER/STENT EXCHANGE/ RETROGRADE PYELOGRAM (Right)     Patient location during evaluation: PACU Anesthesia Type: General Level of consciousness: awake and alert Pain management: pain level controlled Vital Signs Assessment: post-procedure vital signs reviewed and stable Respiratory status: spontaneous breathing, nonlabored ventilation, respiratory function stable and patient connected to nasal cannula oxygen Cardiovascular status: blood pressure returned to baseline and stable Postop Assessment: no apparent nausea or vomiting Anesthetic complications: no   No notable events documented.  Last Vitals:  Vitals:   06/07/24 1230 06/07/24 1319  BP:  (!) 150/74  Pulse:  73  Resp:  16  Temp:  36.5 C  SpO2: 96% 95%    Last Pain:  Vitals:   06/07/24 1319  TempSrc: Oral  PainSc: 0-No pain                 Epifanio Lamar BRAVO

## 2024-06-07 NOTE — Discharge Instructions (Signed)
 [facility] [phone] Post Ureteroscopy With or Without Stent Instructions  Definitions:  Ureter: The duct that transports urine from the kidney to the bladder. Stent:   A plastic hollow tube that is placed into the ureter, from the kidney to the bladder to prevent the ureter from swelling shut.  GENERAL INSTRUCTIONS:  Despite the fact that no skin incisions were used, the area around the ureter and bladder is raw and irritated. The stent is a foreign body which will further irritate the bladder wall. This irritation is manifested by increased frequency of urination, both day and night, and by an increase in the urge to urinate. In some, the urge to urinate is present almost always. Sometimes the urge is strong enough that you may not be able to stop yourself from urinating. The only real cure is to remove the stent and then give time for the bladder wall to heal which can't be done until the danger of the ureter swelling shut has passed, which varies.  You may see some blood in your urine while the stent is in place and a few days afterwards. Do not be alarmed, even if the urine was clear for a while. Get off your feet and drink lots of fluids until clearing occurs. If you start to pass clots or don't improve, call us .  DIET: You may return to your normal diet immediately. Because of the raw surface of your bladder, alcohol , spicy foods, acid type foods and drinks with caffeine may cause irritation or frequency and should be used in moderation. To keep your urine flowing freely and to avoid constipation, drink plenty of fluids during the day ( 8-10 glasses ). Tip: Avoid cranberry juice because it is very acidic.  ACTIVITY: Your physical activity doesn't need to be restricted. However, if you are very active, you may see some blood in your urine. We suggest that you reduce your activity under these circumstances until the bleeding has stopped.  BOWELS: It is important to  keep your bowels regular during the postoperative period. Straining with bowel movements can cause bleeding. A bowel movement every other day is reasonable. Use a mild laxative if needed, such as Milk of Magnesia 2-3 tablespoons, or 2 Dulcolax tablets. Call if you continue to have problems. If you have been taking narcotics for pain, before, during or after your surgery, you may be constipated. Take a laxative if necessary.   MEDICATION: You should resume your pre-surgery medications unless told not to. In addition you will often be given an antibiotic to prevent infection. These should be taken as prescribed until the bottles are finished unless you are having an unusual reaction to one of the drugs.  PROBLEMS YOU SHOULD REPORT TO US : Fevers over 100.5 Fahrenheit. Heavy bleeding, or clots ( See above notes about blood in urine ). Inability to urinate. Drug reactions ( hives, rash, nausea, vomiting, diarrhea ). Severe burning or pain with urination that is not improving.  FOLLOW-UP: You will need a follow-up appointment to monitor your progress. Call for this appointment at the number listed above. Usually the first appointment will be about three to fourteen days after your surgery.  You may remove stent on Monday AM.

## 2024-06-07 NOTE — Transfer of Care (Signed)
 Immediate Anesthesia Transfer of Care Note  Patient: Helen Rice  Procedure(s) Performed: CYSTOSCOPY/URETEROSCOPY/HOLMIUM LASER/STENT EXCHANGE/ RETROGRADE PYELOGRAM (Right)  Patient Location: PACU  Anesthesia Type:General  Level of Consciousness: awake and patient cooperative  Airway & Oxygen Therapy: Patient Spontanous Breathing and Patient connected to face mask  Post-op Assessment: Report given to RN and Post -op Vital signs reviewed and stable  Post vital signs: Reviewed and stable  Last Vitals:  Vitals Value Taken Time  BP 153/82 06/07/24 12:20  Temp    Pulse 77 06/07/24 12:22  Resp 11 06/07/24 12:22  SpO2 100 % 06/07/24 12:22  Vitals shown include unfiled device data.  Last Pain:  Vitals:   06/07/24 1013  TempSrc: Oral  PainSc: 8          Complications: No notable events documented.

## 2024-06-07 NOTE — H&P (Signed)
 Urology Preoperative H&P   Chief Complaint: right ureteral and renal stone   History of Present Illness: Helen Rice is a 48 y.o. female with right ureteral and renal stone here for right URS/LL/R stent exchange.  1. Urolithiasis: - She was found to have gross hematuria and CT hematuria protocol 10/08/2023 revealed large staghorn stone occupying the entirety of the right collecting system and renal pelvis measuring 5.5 x 3.4 cm in caliber. There is no hydronephrosis. She had no left sided renal stones. She was seen by Dr. Matilda and had cystoscopy that was unremarkable. -S/p staged R PCNL on 01/12/2024 and 01/15/2024. Stone analysis: 100% carbonate appetite. Postoperative CT 01/16/2024 with small residual calcification in the intrapole of the kidney. -Right ureteral stent removed on 02/02/2024. -Renal ultrasound 04/02/2024: A few small fragments, no obvious hydronephrosis. - 24-hour urine 02/2024: Urine volume only 990 mL, hypomagnesemia area, borderline high urine pH of 6.3.  -Presents on 05/29/2024 with severe right-sided flank pain over the past 24 hours, associate with nausea and emesis. She denies fevers, chills, dysuria. -CT A/P 05/29/2024 with 7 mm distal right ureteral stone with proximal right hydronephrosis and 8 mm right interpolar stone. - She is having significant pain despite pain management and is requesting some placement.  She has a past medical history of hyperlipidemia, depression. She denies cardiac or pulmonary history. She denies taking anticoagulation.   Past Medical History:  Diagnosis Date   Anemia    hx of   Anxiety    Arthritis    Depression    Difficult intubation    GERD (gastroesophageal reflux disease) 05/30/2017   History of kidney stones    Hypertension    Lyme disease    being seen by [facility]   Pneumonia    Psoriasis     Past Surgical History:  Procedure Laterality Date   ABDOMINAL HYSTERECTOMY     APPENDECTOMY     CESAREAN  SECTION     CHOLECYSTECTOMY     COLONOSCOPY     upper/lower   CYSTOSCOPY WITH RETROGRADE PYELOGRAM, URETEROSCOPY AND STENT PLACEMENT Right 05/29/2024   Procedure: CYSTOURETEROSCOPY, WITH RETROGRADE PYELOGRAM AND STENT INSERTION;  Surgeon: Renda Glance, MD;  Location: [facility];  Service: Urology;  Laterality: Right;   CYSTOSCOPY/URETEROSCOPY/HOLMIUM LASER/STENT PLACEMENT Right 01/12/2024   Procedure: CYSTOSCOPY/URETEROSCOPY/HOLMIUM LASER/STENT PLACEMENT;  Surgeon: Selma Donnice SAUNDERS, MD;  Location: [facility];  Service: Urology;  Laterality: Right;  RETROGRADE PYELGRAM NEEDED   CYSTOSCOPY/URETEROSCOPY/HOLMIUM LASER/STENT PLACEMENT Right 01/15/2024   Procedure: CYSTOSCOPY/URETEROSCOPY/HOLMIUM LASER/STENT PLACEMENT;  Surgeon: Selma Donnice SAUNDERS, MD;  Location: [facility];  Service: Urology;  Laterality: Right;  WITH RIGHT RETROGRADE PYELOGRAM   GIVENS CAPSULE STUDY N/A 06/21/2017   Procedure: GIVENS CAPSULE STUDY;  Surgeon: Golda Claudis PENNER, MD;  Location: [facility];  Service: Endoscopy;  Laterality: N/A;   IR URETERAL STENT RIGHT NEW ACCESS W/O SEP NEPHROSTOMY CATH  01/12/2024   NEPHROLITHOTOMY Right 01/12/2024   Procedure: NEPHROLITHOTOMY PERCUTANEOUS SECOND LOOK;  Surgeon: Selma Donnice SAUNDERS, MD;  Location: [facility];  Service: Urology;  Laterality: Right;  FIRST STAGE PCNL, NOT SECOND LOOK   NEPHROLITHOTOMY Right 01/15/2024   Procedure: NEPHROLITHOTOMY PERCUTANEOUS SECOND LOOK;  Surgeon: Selma Donnice SAUNDERS, MD;  Location: [facility];  Service: Urology;  Laterality: Right;    Allergies:  Allergies  Allergen Reactions   Influenza Vaccines     Sweating and chills    Elemental Sulfur Rash   Latex Rash    Family History  Problem Relation Age of  Onset   Cancer Father    Psoriasis Father    Autoimmune disease Father    Breast cancer Sister    Heart attack Brother 27       had a second MI which required quad bypass 7yr   Heart attack Brother 37       MI w/ stent   Thyroid  disease Maternal Aunt    Heart disease Maternal  Grandmother    Heart disease Maternal Grandfather    Ovarian cancer Paternal Grandmother 50   Ovarian cancer Paternal Grandfather     Social History:  reports that she quit smoking about 9 years ago. Her smoking use included cigarettes. She started smoking about 24 years ago. She has a 22.5 pack-year smoking history. She has never used smokeless tobacco. She reports current alcohol  use. She reports that she does not use drugs.  ROS: A complete review of systems was performed.  All systems are negative except for pertinent findings as noted.  Physical Exam:  Vital signs in last 24 hours: Temp:  [98.6 F (37 C)] 98.6 F (37 C) (10/30 1327) Pulse Rate:  [80] 80 (10/30 1327) Resp:  [16] 16 (10/30 1327) BP: (135)/(78) 135/78 (10/30 1327) SpO2:  [99 %] 99 % (10/30 1327) Weight:  [61.7 kg] 61.7 kg (10/30 1327) Constitutional:  Alert and oriented, No acute distress Cardiovascular: Regular rate and rhythm Respiratory: Normal respiratory effort, Lungs clear bilaterally GI: Abdomen is soft, nontender, nondistended, no abdominal masses GU: No CVA tenderness Lymphatic: No lymphadenopathy Neurologic: Grossly intact, no focal deficits Psychiatric: Normal mood and affect  Laboratory Data:  Recent Labs    06/06/24 1400  WBC 6.5  HGB 12.6  HCT 39.2  PLT 352    Recent Labs    06/06/24 1400  NA 142  K 4.0  CL 104  GLUCOSE 95  BUN 16  CALCIUM 10.2  CREATININE 0.92     Results for orders placed or performed during the hospital encounter of 06/06/24 (from the past 24 hours)  Basic metabolic panel per protocol     Status: None   Collection Time: 06/06/24  2:00 PM  Result Value Ref Range   Sodium 142 135 - 145 mmol/L   Potassium 4.0 3.5 - 5.1 mmol/L   Chloride 104 98 - 111 mmol/L   CO2 28 22 - 32 mmol/L   Glucose, Bld 95 70 - 99 mg/dL   BUN 16 6 - 20 mg/dL   Creatinine, Ser 9.07 0.44 - 1.00 mg/dL   Calcium 89.7 8.9 - 89.6 mg/dL   GFR, Estimated >39 >39 mL/min   Anion gap 10 5  - 15  CBC per protocol     Status: None   Collection Time: 06/06/24  2:00 PM  Result Value Ref Range   WBC 6.5 4.0 - 10.5 K/uL   RBC 4.30 3.87 - 5.11 MIL/uL   Hemoglobin 12.6 12.0 - 15.0 g/dL   HCT 60.7 63.9 - 53.9 %   MCV 91.2 80.0 - 100.0 fL   MCH 29.3 26.0 - 34.0 pg   MCHC 32.1 30.0 - 36.0 g/dL   RDW 87.0 88.4 - 84.4 %   Platelets 352 150 - 400 K/uL   nRBC 0.0 0.0 - 0.2 %   No results found for this or any previous visit (from the past 240 hours).  Renal Function: Recent Labs    06/06/24 1400  CREATININE 0.92   Estimated Creatinine Clearance: 63.9 mL/min (by C-G formula based on SCr of 0.92  mg/dL).  Radiologic Imaging: No results found.  I independently reviewed the above imaging studies.  Assessment and Plan Marliyah Reid Awe is a 48 y.o. female with right ureteral and renal stone here for right URS/LL, R stent exchange.  -The risks, benefits and alternatives of cystoscopy with right URS/LL, right JJ stent exchange was discussed with the patient.  Risks include, but are not limited to: bleeding, urinary tract infection, ureteral injury, ureteral stricture disease, chronic pain, urinary symptoms, bladder injury, stent migration, the need for nephrostomy tube placement, MI, CVA, DVT, PE and the inherent risks with general anesthesia.  The patient voices understanding and wishes to proceed.    Matt R. Anora Schwenke MD 06/07/2024, 9:57 AM  [facility] Pager: [phone]): [phone]

## 2024-06-07 NOTE — Op Note (Signed)
 Operative Note  Preoperative diagnosis:  1.  Right ureteral and renal stone  Postoperative diagnosis: 1.  Right ureteral and renal stone  Procedure(s): 1.  Cystoscopy 2. Right ureteroscopy with laser lithotripsy and basket extraction of stones 3. Right retrograde pyelogram 4. Right ureteral stent exchange 5. Fluoroscopy with intraoperative interpretation  Surgeon: Donnice Siad, MD  Assistants:  None  Anesthesia:  General  Complications:  None  EBL:  Minimal  Specimens: 1. None  Drains/Catheters: 1.  Right 6Fr x 24cm ureteral stent with a tether string  Intraoperative findings:   Cystoscopy demonstrated no suspicious bladder lesions. Right retrograde pyelogram with mild hydronephrosis. Ureteroscopy demonstrated impacted 8mm distal right ureteral stone succesfully fragmented and extracted. Pyeloscopy demonstrated no calcifications in the collecting system or calyces surveyed. There is a calcification that appears to be interpolar behind a tight infundibulum that was inaccessible with retrograde ureteroscopy. Successful right ureteral stent placement.  Indication:  ARYAA BUNTING is a 48 y.o. female with a distal ureteral stone and renal stone here for ureteroscopy with laser lithotripsy with basket extraction of stones.  Description of procedure: After informed consent was obtained from the patient, the patient was identified and taken to the operating room and placed in the supine position.  General anesthesia was administered as well as perioperative IV antibiotics.  At the beginning of the case, a time-out was performed to properly identify the patient, the surgery to be performed, and the surgical site.  Sequential compression devices were applied to the lower extremities at the beginning of the case for DVT prophylaxis.  The patient was then placed in the dorsal lithotomy supine position, prepped and draped in sterile fashion.  We then passed the 21-French rigid cystoscope  through the urethra and into the bladder under vision without any difficulty, noting a normal urethra.  A systematic evaluation of the bladder revealed no evidence of any suspicious bladder lesions.  Ureteral orifices were in normal position.    The distal aspect of the ureteral stent was seen protruding from the right ureteral orifice.  We then used the alligator-tooth forceps and grasped the distal end of the ureteral stent and brought it out the urethral meatus while watching the proximal coil straighten out nicely on fluoroscopy. Through the ureteral stent, we then passed a 0.038 sensor wire up to the level of the renal pelvis.  The ureteral stent was then removed, leaving the sensor wire up the right ureter.    Under cystoscopic and flouroscopic guidance, we cannulated the right ureteral orifice with a 5-French open-ended ureteral catheter and a gentle retrograde pyelogram was performed, revealing a normal caliber ureter without any filling defects. There was mild hydronephrosis of the collecting system. A 0.038 sensor wire was then passed up to the level of the renal pelvis and secured to the drape as a safety wire. The ureteral catheter and cystoscope were removed, leaving the safety wire in place.   A semi-rigid ureteroscope was passed alongside the wire up the distal ureter and we encountered an impacted 8 mm stone in the distal right ureter with surrounding inflamed mucosa.  Using 200 m holmium laser fiber, the stone was then fragmented in each fragment basket extracted.  We then navigated beyond this area of impaction there was slight narrow caliber to the ureter however bypassed with the scope, I surveyed the entire ureter with no further stone fragments remaining.  I then placed a separate 0.038 sensor wire into the kidney.  Over this wire, passed a dual-lumen  scope.  I then surveyed the kidney, the entire collecting system and all calyces reviewed, there was no stone present.  There did appear  to be a calcification on fluoroscopy in the interpolar part of the kidney, overlying this calcification, there did to be to be a very narrow infundibulum that was approximately 2-3 French and inaccessible with the ureteroscope.  We then withdrew the ureteroscope back down the ureter, noting no evidence of any stones along the course of the ureter.  Prior to removing the ureteroscope, we did pass the Glidewire back up to the ureter to the renal pelvis.  Once the ureteroscope was removed, we then used the Glidewire under fluoroscopic guidance and passed up a 6-French x 24 cm double-pigtail ureteral stent up the ureter, making sure that the proximal and distal ends coiled within the kidney and bladder respectively.   Once the ureteroscope was removed, the Glidewire was backloaded through the rigid cystoscope, which was then advanced down the urethra and into the bladder. We then used the Glidewire under direct vision through the rigid cystoscope and under fluoroscopic guidance and passed up a 6-French, 24 cm double-pigtail ureteral stent up ureter, making sure that the proximal and distal ends coiled within the kidney and bladder respectively.  Note that we left a long tether string attached to the distal end of the ureteral stent and it exited the urethral meatus and was secured to the inner thigh with a tegaderm adhesive.  The cystoscope was then advanced back into the bladder under vision.  We were able to see the distal stent coiling nicely within the bladder.  The bladder was then emptied with irrigation solution.  The cystoscope was then removed.    The patient tolerated the procedure well and there was no complication. Patient was awoken from anesthesia and taken to the recovery room in stable condition. I was present and scrubbed for the entirety of the case.  Plan:  Patient will be discharged home.  She will remove stent by pulling on attached string on Monday morning.   Matt R. Kameryn Davern MD [facility]  Pager: [phone]

## 2024-06-07 NOTE — Anesthesia Preprocedure Evaluation (Signed)
 Anesthesia Evaluation  Patient identified by MRN, date of birth, ID band Patient awake    Reviewed: Allergy & Precautions, H&P , NPO status , Patient's Chart, lab work & pertinent test results  History of Anesthesia Complications (+) DIFFICULT AIRWAY and history of anesthetic complications  Airway Mallampati: III  TM Distance: >3 FB Neck ROM: Full    Dental no notable dental hx. (+) Teeth Intact, Dental Advisory Given   Pulmonary Patient did not abstain from smoking., former smoker   Pulmonary exam normal breath sounds clear to auscultation       Cardiovascular hypertension, negative cardio ROS Normal cardiovascular exam Rhythm:Regular Rate:Normal     Neuro/Psych  Headaches  Anxiety Depression       GI/Hepatic Neg liver ROS,GERD  ,,  Endo/Other  negative endocrine ROS    Renal/GU Renal disease  negative genitourinary   Musculoskeletal   Abdominal   Peds  Hematology  (+) Blood dyscrasia, anemia Lab Results      Component                Value               Date                      WBC                      7.7                 01/16/2024                HGB                      10.2 (L)            01/16/2024                HCT                      30.9 (L)            01/16/2024                MCV                      92.2                01/16/2024                PLT                      248                 01/16/2024              Anesthesia Other Findings   Reproductive/Obstetrics negative OB ROS                              Anesthesia Physical Anesthesia Plan  ASA: 2  Anesthesia Plan: General   Post-op Pain Management: Tylenol  PO (pre-op)*   Induction: Intravenous  PONV Risk Score and Plan: 4 or greater and Ondansetron , Dexamethasone  and Midazolam   Airway Management Planned: LMA  Additional Equipment:   Intra-op Plan:   Post-operative Plan: Extubation in OR  Informed  Consent: I have reviewed the patients History and Physical, chart, labs and discussed the procedure including  the risks, benefits and alternatives for the proposed anesthesia with the patient or authorized representative who has indicated his/her understanding and acceptance.     Dental advisory given  Plan Discussed with: CRNA  Anesthesia Plan Comments:         Anesthesia Quick Evaluation

## 2024-06-08 ENCOUNTER — Encounter (HOSPITAL_COMMUNITY): Payer: Self-pay | Admitting: Urology

## 2024-06-18 ENCOUNTER — Encounter: Payer: Self-pay | Admitting: Family Medicine

## 2024-06-18 NOTE — Telephone Encounter (Signed)
 This would be more appropriately routed to referrals, as referral is in but there are no updates visible in chart.

## 2024-06-19 ENCOUNTER — Encounter: Payer: Self-pay | Admitting: Family Medicine

## 2024-06-19 ENCOUNTER — Ambulatory Visit (INDEPENDENT_AMBULATORY_CARE_PROVIDER_SITE_OTHER)

## 2024-06-19 ENCOUNTER — Ambulatory Visit: Admitting: Family Medicine

## 2024-06-19 VITALS — BP 134/76 | HR 97 | Temp 97.7°F | Ht 62.0 in | Wt 137.0 lb

## 2024-06-19 DIAGNOSIS — G8929 Other chronic pain: Secondary | ICD-10-CM

## 2024-06-19 DIAGNOSIS — H7290 Unspecified perforation of tympanic membrane, unspecified ear: Secondary | ICD-10-CM | POA: Diagnosis not present

## 2024-06-19 DIAGNOSIS — M25561 Pain in right knee: Secondary | ICD-10-CM

## 2024-06-19 NOTE — Progress Notes (Signed)
 Subjective: CC: Chronic right sided knee pain PCP: Jolinda Norene HERO, DO Helen Rice:Opoj A Staff is a 48 y.o. female presenting to clinic today for:  Patient with couple year history of right sided knee pain.  She actually has been seen by sports medicine and that last office visit was back in August 2022.  The previous MRI of her knee showed a small undersurface tear of the posterior horn of the medial meniscus but otherwise was unremarkable.  She has had corticosteroid injection, physical therapy but still had persistent issues and therefore they recommended consultation with orthopedist, Dr Cristy.  They were anticipating exploratory surgery.  She does not have the gross swelling that she is to have in that right knee but does have some posterior and lateral knee pain and fullness.  At 1 point we thought maybe she had a Baker's cyst.  She notes pain with prolonged ambulation and ambulation up or down stairs.  Denies any weakness in that leg.  No sensory changes.  Also, she notes that she heard a pop in her ear.  Apparently the left side after examination.  She denies any drainage.  No recent changes in barometric pressure.  This occurred today.  She has had a history of tinnitus in that ear but no other abnormalities   ROS: Per HPI  Allergies  Allergen Reactions   Influenza Vaccines     Sweating and chills    Elemental Sulfur Rash   Latex Rash   Past Medical History:  Diagnosis Date   Anemia    hx of   Anxiety    Arthritis    Depression    Difficult intubation    GERD (gastroesophageal reflux disease) 05/30/2017   History of kidney stones    Hypertension    Lyme disease    being seen by [facility]   Pneumonia    Psoriasis     Current Outpatient Medications:    acetaminophen  (TYLENOL ) 500 MG tablet, Take 500-1,000 mg by mouth every 6 (six) hours as needed (pain.)., Disp: , Rfl:    Ascorbic Acid (VITAMIN C PO), Take 1 tablet by mouth in the morning., Disp: ,  Rfl:    B Complex-C (B-COMPLEX WITH VITAMIN C) tablet, Take 1 tablet by mouth in the morning., Disp: , Rfl:    buPROPion  (WELLBUTRIN  XL) 300 MG 24 hr tablet, Take 1 tablet (300 mg total) by mouth in the morning., Disp: 90 tablet, Rfl: 1   Cholecalciferol  (VITAMIN D -3 PO), Take 1 tablet by mouth in the morning., Disp: , Rfl:    Coenzyme Q10 (COQ10 PO), Take 1 tablet by mouth in the morning., Disp: , Rfl:    Collagen Hydrolysate POWD, Take 1 Scoop by mouth in the morning., Disp: , Rfl:    conjugated estrogens  (PREMARIN ) vaginal cream, Place 1 Applicatorful vaginally daily. (Patient taking differently: Place 1 Applicatorful vaginally every 3 (three) days.), Disp: 42.5 g, Rfl: 12   desvenlafaxine  (PRISTIQ ) 100 MG 24 hr tablet, Take 1 tablet (100 mg total) by mouth daily. (Patient taking differently: Take 100 mg by mouth at bedtime.), Disp: 90 tablet, Rfl: 3   docusate sodium  (COLACE) 100 MG capsule, Take 1 capsule (100 mg total) by mouth daily as needed for up to 30 doses., Disp: 30 capsule, Rfl: 0   estradiol  (CLIMARA  - DOSED IN MG/24 HR) 0.1 mg/24hr patch, APPLY 1 PATCH TOPICALLY ONCE A WEEK (Patient taking differently: Place 0.1 mg onto the skin every Tuesday.), Disp: 12 patch, Rfl: 3  HYDROcodone-acetaminophen  (NORCO/VICODIN) 5-325 MG tablet, Take 1-2 tablets by mouth every 6 (six) hours as needed for severe pain (pain score 7-10)., Disp: , Rfl:    hydrOXYzine  (ATARAX ) 25 MG tablet, Take 0.5-1 tablets (12.5-25 mg total) by mouth every 8 (eight) hours as needed for anxiety., Disp: 90 tablet, Rfl: 1   MAGNESIUM PO, Take 1 capsule by mouth at bedtime., Disp: , Rfl:    Multiple Vitamins-Minerals (ZINC PO), Take 1 tablet by mouth at bedtime., Disp: , Rfl:    Omega-3 Fatty Acids (FISH OIL PO), Take 1 capsule by mouth in the morning., Disp: , Rfl:    ondansetron  (ZOFRAN -ODT) 4 MG disintegrating tablet, Take 4 mg by mouth every 4 (four) hours as needed for nausea or vomiting., Disp: , Rfl:     oxyCODONE -acetaminophen  (PERCOCET) 5-325 MG tablet, Take 1 tablet by mouth every 4 (four) hours as needed for up to 18 doses for severe pain (pain score 7-10)., Disp: 18 tablet, Rfl: 0   Polyethyl Glycol-Propyl Glycol (LUBRICANT EYE DROPS) 0.4-0.3 % SOLN, Place 1-2 drops into both eyes 3 (three) times daily as needed (dry/irritated eyes.)., Disp: , Rfl:    Specialty Vitamins Products (COLLAGEN ULTRA) CAPS, Take 1 capsule by mouth in the morning. (Gummy), Disp: , Rfl:    VITAMIN E PO, Take 1 capsule by mouth at bedtime., Disp: , Rfl:  Social History   Socioeconomic History   Marital status: Married    Spouse name: Not on file   Number of children: 1   Years of education: GED   Highest education level: Some college, no degree  Occupational History   Occupation: [employer]  Tobacco Use   Smoking status: Former    Current packs/day: 0.00    Average packs/day: 1.5 packs/day for 15.0 years (22.5 ttl pk-yrs)    Types: Cigarettes    Start date: 2001    Quit date: 2016    Years since quitting: 9.8   Smokeless tobacco: Never  Vaping Use   Vaping status: Every Day   Substances: Nicotine  Substance and Sexual Activity   Alcohol  use: Yes    Comment: occ   Drug use: No   Sexual activity: Yes    Birth control/protection: Surgical  Other Topics Concern   Not on file  Social History Narrative   Right handed    Caffeine use: Drinks 1 cup coffee per day   1-3 sodas per day   Lives with husband   Social Drivers of Corporate Investment Banker Strain: Low Risk  (05/30/2024)   Overall Financial Resource Strain (CARDIA)    Difficulty of Paying Living Expenses: Not very hard  Food Insecurity: No Food Insecurity (05/30/2024)   Hunger Vital Sign    Worried About Running Out of Food in the Last Year: Never true    Ran Out of Food in the Last Year: Never true  Transportation Needs: No Transportation Needs (05/30/2024)   PRAPARE - Administrator, Civil Service (Medical): No    Lack of  Transportation (Non-Medical): No  Physical Activity: Insufficiently Active (05/30/2024)   Exercise Vital Sign    Days of Exercise per Week: 3 days    Minutes of Exercise per Session: 10 min  Stress: No Stress Concern Present (05/30/2024)   [employer] of Occupational Health - Occupational Stress Questionnaire    Feeling of Stress: Only a little  Social Connections: Moderately Isolated (05/30/2024)   Social Connection and Isolation Panel    Frequency of Communication with  Friends and Family: More than three times a week    Frequency of Social Gatherings with Friends and Family: Once a week    Attends Religious Services: Never    Database Administrator or Organizations: No    Attends Engineer, Structural: Not on file    Marital Status: Married  Catering Manager Violence: Not At Risk (01/12/2024)   Humiliation, Afraid, Rape, and Kick questionnaire    Fear of Current or Ex-Partner: No    Emotionally Abused: No    Physically Abused: No    Sexually Abused: No   Family History  Problem Relation Age of Onset   Cancer Father    Psoriasis Father    Autoimmune disease Father    Breast cancer Sister    Heart attack Brother 91       had a second MI which required quad bypass 23yr   Heart attack Brother 29       MI w/ stent   Thyroid  disease Maternal Aunt    Heart disease Maternal Grandmother    Heart disease Maternal Grandfather    Ovarian cancer Paternal Grandmother 62   Ovarian cancer Paternal Grandfather     Objective: Office vital signs reviewed. BP 134/76   Pulse 97   Temp 97.7 F (36.5 C)   Ht 5' 2 (1.575 m)   Wt 137 lb (62.1 kg)   SpO2 98%   BMI 25.06 kg/m   Physical Examination:  General: Awake, alert, well nourished, No acute distress HEENT: Left TM with tiny perforation centrally Cardio: regular rate and rhythm, S1S2 heard, no murmurs appreciated MSK: Ambulating independently with normal gait and station.  No gross swelling or warmth or erythema of  the right knee.  She has tenderness to palpation out of proportion to exam over the patella, medial and lateral joint line.  She has some slight fullness in the posterior popliteal fossa.  No ligamentous laxity.  Minimal pain with Thessaly maneuver  Assessment/ Plan: 48 y.o. female   Chronic pain of right knee - Plan: DG Knee 1-2 Views Right, Ambulatory referral to Orthopedic Surgery  Perforation of tympanic membrane of less than 50%   Plain views of the right knee repeated today.  Will copy results to orthopedics.  I have placed referral back to Dr. Cristy versus Dareen, whomever can see her sooner. We discussed she likely will require repeat MRI since it has been so many years since her last one.  Her left ear exam showed perforation of the tympanic membrane that was pinpoint in size but we discussed that whilst it is not infected currently she should exercise extreme caution and report back to me should she demonstrate any red flag signs or symptoms.  She voiced good understanding    Lynnette Pote CHRISTELLA Fielding, DO [facility] [phone]

## 2024-06-20 ENCOUNTER — Ambulatory Visit: Payer: Self-pay | Admitting: Family Medicine

## 2024-07-21 ENCOUNTER — Other Ambulatory Visit: Payer: Self-pay | Admitting: Family Medicine

## 2024-07-21 DIAGNOSIS — F331 Major depressive disorder, recurrent, moderate: Secondary | ICD-10-CM

## 2024-08-17 ENCOUNTER — Other Ambulatory Visit: Payer: Self-pay | Admitting: Family Medicine

## 2024-08-17 DIAGNOSIS — F331 Major depressive disorder, recurrent, moderate: Secondary | ICD-10-CM

## 2024-08-17 DIAGNOSIS — F4320 Adjustment disorder, unspecified: Secondary | ICD-10-CM

## 2024-12-24 ENCOUNTER — Encounter: Payer: Self-pay | Admitting: Family Medicine
